# Patient Record
Sex: Female | Born: 1935 | Race: White | Hispanic: No | Smoking: Never smoker
Health system: Southern US, Community
[De-identification: ages and names within clinical notes are randomized; demographics above are authoritative.]

## PROBLEM LIST (undated history)

## (undated) DIAGNOSIS — I10 Essential (primary) hypertension: Secondary | ICD-10-CM

## (undated) DIAGNOSIS — IMO0001 Reserved for inherently not codable concepts without codable children: Secondary | ICD-10-CM

## (undated) DIAGNOSIS — I2510 Atherosclerotic heart disease of native coronary artery without angina pectoris: Secondary | ICD-10-CM

## (undated) DIAGNOSIS — IMO0002 Reserved for concepts with insufficient information to code with codable children: Secondary | ICD-10-CM

## (undated) DIAGNOSIS — K922 Gastrointestinal hemorrhage, unspecified: Secondary | ICD-10-CM

## (undated) DIAGNOSIS — M1990 Unspecified osteoarthritis, unspecified site: Secondary | ICD-10-CM

## (undated) DIAGNOSIS — E7800 Pure hypercholesterolemia, unspecified: Secondary | ICD-10-CM

## (undated) HISTORY — PX: ROTATOR CUFF REPAIR: SHX139

## (undated) HISTORY — PX: BACK SURGERY: SHX140

---

## 2006-08-23 ENCOUNTER — Inpatient Hospital Stay (HOSPITAL_COMMUNITY): Admission: EM | Admit: 2006-08-23 | Discharge: 2006-08-27 | Payer: Self-pay | Admitting: Emergency Medicine

## 2006-08-24 ENCOUNTER — Ambulatory Visit: Payer: Self-pay | Admitting: Vascular Surgery

## 2008-09-09 ENCOUNTER — Ambulatory Visit (HOSPITAL_COMMUNITY): Admission: RE | Admit: 2008-09-09 | Discharge: 2008-09-10 | Payer: Self-pay | Admitting: Obstetrics and Gynecology

## 2010-04-30 LAB — CBC
HCT: 40.4 % (ref 36.0–46.0)
Hemoglobin: 13.7 g/dL (ref 12.0–15.0)
MCHC: 33.8 g/dL (ref 30.0–36.0)
MCV: 89.8 fL (ref 78.0–100.0)
Platelets: 275 10*3/uL (ref 150–400)
RDW: 13.6 % (ref 11.5–15.5)
RDW: 13.8 % (ref 11.5–15.5)

## 2010-04-30 LAB — BASIC METABOLIC PANEL
BUN: 12 mg/dL (ref 6–23)
Calcium: 9.8 mg/dL (ref 8.4–10.5)
Creatinine, Ser: 0.83 mg/dL (ref 0.4–1.2)
GFR calc non Af Amer: >60 mL/min (ref >60)
Glucose, Bld: 107 mg/dL — ABNORMAL HIGH (ref 70–99)

## 2010-06-07 NOTE — Op Note (Signed)
Jamie Brown, Jamie Brown                  ACCOUNT NO.:  [id number]   MEDICAL RECORD NO.:  [id number]          PATIENT TYPE:  OIB   LOCATION:  9319                          FACILITY:  [facility]   PHYSICIAN:  Michelle L. Grewal, M.D.DATE OF BIRTH:  Dec 30, 1924   DATE OF PROCEDURE:  09/09/2008  DATE OF DISCHARGE:                               OPERATIVE REPORT   PREOPERATIVE DIAGNOSIS:  Symptomatic rectocele.   POSTOPERATIVE DIAGNOSIS:  Symptomatic rectocele.   PROCEDURE:  Posterior repair.   SURGEON:  Michelle L. Vincente Poli, MD   ANESTHESIA:  Spinal.   FINDINGS:  Grade 3 rectocele.   ESTIMATED BLOOD LOSS:  Minimal.   COMPLICATIONS:  None.   PROCEDURE:  The patient was taken to the operating room.  Her spinal was  placed by Dr. Malen Gauze without incident.  She was then prepped and draped  in usual sterile fashion, placed in the low lithotomy position.  A Foley  catheter was inserted.  Exam under anesthesia revealed a grade 3  rectocele.  She had excellent support of the bladder and her cervix was  deep in the vagina.  Allis clamps were placed at the perineum at 5 and 7  o'clock.  A V-shaped incision was made with a scalpel.  A midline  incision was made at the posterior wall of vagina with Metzenbaum  scissors.  The rectocele was reduced using sharp and blunt dissection.  We then fanned out the vaginal epithelium on both sides after the  rectocele was nicely reduced.  Then, interrupted using 0 Vicryl suture  was placed distally moving proximally and had excellent reduction of the  rectocele.  Careful attention was used to avoid the rectum.  After this  was done, a small figure-of-eight was placed in the perineum to  reapproximate the perineal muscles in the midline.  She had excellent  support of the perineal body after that.  The redundant vaginal  epithelium was trimmed.  The incision was closed with a running locked  stitch using 2-0 Vicryl starting distally and moving proximally and then  closing the skin with a subcuticular.  There was no bleeding noted at  the conclusion of the procedure.  There was no oozing at the suture  line.  Packing with Iodoform with Estrace cream was then carefully  placed in the vagina.  All sponge, lap, and instrument counts were  correct x2.  The patient went to recovery room in stable condition.      Michelle L. Vincente Poli, M.D.  Electronically Signed     MLG/MEDQ  D:  09/09/2008  T:  09/09/2008  Job:  [id number]

## 2010-06-07 NOTE — H&P (Signed)
Jamie Brown, HANNON NO.:  [id number]   MEDICAL RECORD NO.:  [id number]          PATIENT TYPE:  EMS   LOCATION:  URG                          FACILITY:  [facility]   PHYSICIAN:  Hind I Elsaid, MD      DATE OF BIRTH:  November 01, 1924   DATE OF ADMISSION:  08/23/2006  DATE OF DISCHARGE:                              HISTORY & PHYSICAL   CHIEF COMPLAINT:  Fever and nausea for 4 days.   HISTORY OF PRESENT ILLNESS:  A 77 year old female with a history of  hypertension and a history of dyslipidemia.  She was recently evaluated  4 weeks ago with nausea and vomiting and diarrhea; 4 weeks ago diagnosed  as viral gastritis.  Her symptoms were resolved, but the patient  continued to complain of generalized body weakness since then.  This  week the patient has chills, and the daughter noticed a fever of 102.7  at home.  Condition also associated with nausea and vomiting 3 to 4  times on Monday in addition to vomiting 3 to 4 times on Tuesday, but no  further vomiting was noticed by the daughter or her mom.  Patient denies  any abdominal pain, denies any diarrhea.  Last bowel movement today was  normal.  Patient seen 2 weeks ago and had some lab workup was done where  everything was, according to daughter, within normal.  Today patient was  evaluated at the urgent care because of fever and body weakness.  Also,  condition associated with loss of appetite, and, according to the  patient, she lost almost 8 pounds within the last 4 weeks.  The patient  denies any chest pain, denies shortness of breath, denies any headache,  numbness, or weakness of her extremities.  Denies any burning  micturition.  Has mild cough which is dry in nature.   PAST MEDICAL HISTORY:  Significant for:  1. Hypertension.  2. Cholesterolemia.  3. Coronary artery disease status post angioplasty 20 years ago.  Is      status post nuclear studies done 2 to 3 months ago which was      negative, according to the  daughter.  4. A history of back surgery.  5. Bilateral shoulder surgery.  6. C-section.  7. Some hearing defect on her right ear.   MEDICATIONS:  1. Procardia-XL 60 mg p.o. daily.  2. Aspirin 80 mg p.o. daily.  3. Simvastatin 4 mg p.o. at bedtime.  4. Ambien 10 mg at bedtime.  5. Torsemide 20 mg p.o. daily.  6. Darvocet N-100 as needed.  7. Potassium 10 mEq p.o. daily.  8. Calcium p.o. b.i.d.  9. Magnesium.  10.Zinc sulfate.  11.Glucosamine 500 mg p.o. b.i.d.  12.Vitamin B12.  13.Vitamin C.  14.Fish oil.  15.Multivitamins.   ALLERGIES:  CODEINE.   SOCIAL HISTORY:  no history of smoking, alcohol occasionally, widow,  husband died more than 10 years ago.   EXAMINATION DONE IN THE EMERGENCY ROOM:  VITAL SIGNS:  The patient has a  temperature of 99.4, blood pressure 106/58, pulse rate 111, regular rate  and rhythm, respiratory rate 12, and saturating 96% on room air.  GENERAL:  Patient lying on bed comfortable, in no apparent respiratory  distress or shortness of breath.  HEENT:  Normocephalic, atraumatic.  Pupils equal, round, reactive to  light and accommodation.  Extraocular muscle movement within normal.  Throat:  No evidence of tonsillar infections or pharyngitis.  NECK EXAMINATION:  Neck supple.  No thyromegaly, no JVD, and no  lymphadenopathy.  Carotid bruits absent.  HEART:  S1 and S2, no added sounds.  LUNG EXAMINATION:  Normal vesicular breathing, equal air entry.  ABDOMEN:  Soft, mild tenderness at the left suprapubic area, bowel  sounds positive.  No organomegaly.  EXTREMITIES:  Mild lower limb edema, right lower leg more than left, and  peripheral pulses intact.  BACK:  There is no point tenderness.  SKIN:  There is no evidence of rashes, no cyanosis.  CNS:  Alert, oriented x3 with no focal neurological finding.   Blood workup done in the ER:  UA show large leukocyte esterase, urine  nitrite positive, urine total protein 30, and urobilinogen 0.2, sodium,   hemoglobin is small, urine pH is 6.  White blood cells 21.5, hemoglobin  11.4, hematocrit 34.6, platelets 300 with some left shift.  That is all  blood workup done in the emergency room.   ASSESSMENT AND PLAN:  1. Above symptoms, denies body weakness with fever and also diet,      could be related to urinary tract infection.  We will start the      patient on intravenous antibiotic pending culture sensitivity, and      we will admit the patient.  2. Hypertension.  Continue home medication.  3. Dyslipidemia.  Continue her medication.  4. Lower extremity, right more than left lower extremity, edema.      According to the patient, this is chronic secondary to phlebitis.      We will get venous Doppler of the lower extremity for further      evaluation.  5. Deep vein thrombosis and gastrointestinal prophylaxis.      Hind Bosie Helper, MD  Electronically Signed     HIE/MEDQ  D:  08/23/2006  T:  08/24/2006  Job:  [id number]

## 2010-06-10 NOTE — Discharge Summary (Signed)
NAMEMARLANE, Jamie Brown                  ACCOUNT NO.:  [id number]   MEDICAL RECORD NO.:  [id number]          PATIENT TYPE:  INP   LOCATION:  5522                         FACILITY:  [facility]   PHYSICIAN:  Jamie Brown, M.D.      DATE OF BIRTH:  July 22, 1924   DATE OF ADMISSION:  08/23/2006  DATE OF DISCHARGE:  08/27/2006                               DISCHARGE SUMMARY   PRIMARY CARE PHYSICIAN:  The patient was unassigned to [place].  She has a  primary care physician out of town well and was to follow up with him.   DISCHARGE DIAGNOSES:  1. Generalized weakness probably secondary to urinary tract infection.  2. Pyelonephritis.  3. Dehydration with acute renal failure.  4. Hypertension.  5. Hyponatremia.  6. Dyslipidemia.  7. Leukocytosis.  8. Gastroesophageal reflux disease.   DISCHARGE MEDICATIONS:  1. Levaquin 500 mg daily for 5 days.  2. Aspirin 810 mg daily.  3. Colace 100 mg twice a day.  4. Demadex 20 mg daily.  5. Zocor 40 mg q.h.s.  6. Protonix 40 mg b.i.d.  7. Potassium chloride 10 mEq daily.  8. Procardia 60 mg daily.  9. Multivitamin 1 tablet daily.  10.Xalatan ophthalmic eyedrops applied q.h.s.   DISPOSITION:  The patient was discharged home to follow up with her  physician within 1 week.  Also she was asked to call back if there was  any fever, nausea, vomiting or recurrence of symptoms.   PROCEDURES PERFORMED THIS ADMISSION:  Chest x-ray on August 23, 2006  showed features of COPD, left basal atelectasis, dilated small-bowel  loops in the abdomen.  A follow-up chest x-ray on August 3 showed  emphysema with tiny bilateral pleural effusions, no edema or focal lung  consolidation.   CONSULTATIONS:  None.   BRIEF HISTORY AND PHYSICAL:  Please refer to dictated history and  physical by Dr. Darden Palmer. In short however, the patient was a 77-  year-old gentleman that presented to the ER with fever and nausea for 4  days.  She was also weak generally.  The patient had a  history of  hypertension, coronary artery disease, etc.  He was found however to  have evidence of UTI.   HOSPITAL COURSE:  1. Generalized weakness.  This was thought to be secondary to his      urinary tract infection and dehydration. With treatment the patient      improved and at the time of discharge his weakness has literally      resolved.  2. Urinary tract infection.  The patient was initially placed on Cipro      for the first 3 days.  Later with some cough although there was no      evidence of pneumonia, he was discharged on Levaquin to complete 7-      10 days of treatment.  3. Dehydration and acute renal failure.  The patient's BUN and      creatinine was 1.208.  His GFR was above 49.  He was dehydrated at      the time of admission.  After hydration, his BUN and creatinine      resolved and his BUN was 7, creatinine was 0.79 prior to discharge.      4.  Hypertension.  Brown pressure was initially low but the      patient's  Brown pressure rose to 154/81. He was started back on his home  medication and that seemed to have helped and control his Brown  pressure.  1. Hypokalemia.  This was transient and was completely resolved prior      to discharge.  2. Dyslipidemia.  The patient was on statin prior to admission and was      maintained on statin at the time of discharge.  3. Other medical problems essentially stable at the time of discharge.      Jamie Brown, M.D.  Electronically Signed     LG/MEDQ  D:  09/27/2006  T:  09/27/2006  Job:  [id number]

## 2010-11-07 LAB — CARDIAC PANEL(CRET KIN+CKTOT+MB+TROPI)
CK, MB: 1.8
Relative Index: 1.3
Relative Index: INVALID
Total CK: 71
Troponin I: 0.1 — ABNORMAL HIGH

## 2010-11-07 LAB — CBC
HCT: 29.2 — ABNORMAL LOW
HCT: 29.3 — ABNORMAL LOW
HCT: 29.8 — ABNORMAL LOW
HCT: 34.6 — ABNORMAL LOW
HCT: 34.6 — ABNORMAL LOW
Hemoglobin: 10.1 — ABNORMAL LOW
Hemoglobin: 10.2 — ABNORMAL LOW
Hemoglobin: 11.4 — ABNORMAL LOW
Hemoglobin: 11.7 — ABNORMAL LOW
MCHC: 34.1
MCHC: 34.4
MCHC: 34.5
MCHC: 33
MCHC: 33.9
MCV: 88.5
MCV: 88.8
MCV: 90.2
MCV: 91
Platelets: 263
Platelets: 289
Platelets: 346
Platelets: 291
Platelets: 300
RBC: 3.29 — ABNORMAL LOW
RBC: 3.37 — ABNORMAL LOW
RBC: 3.8 — ABNORMAL LOW
RBC: 3.83 — ABNORMAL LOW
RDW: 13.1
RDW: 13.6
RDW: 13.9
RDW: 12.9
RDW: 13
WBC: 16.9 — ABNORMAL HIGH
WBC: 17.8 — ABNORMAL HIGH
WBC: 18.3 — ABNORMAL HIGH
WBC: 21.5 — ABNORMAL HIGH

## 2010-11-07 LAB — PHOSPHORUS: Phosphorus: 3.2

## 2010-11-07 LAB — BASIC METABOLIC PANEL
BUN: 13
BUN: 7
CO2: 24
CO2: 26
Calcium: 8.3 — ABNORMAL LOW
Calcium: 8.6
Creatinine, Ser: 0.79
GFR calc non Af Amer: 49 — ABNORMAL LOW
GFR calc non Af Amer: 57 — ABNORMAL LOW
GFR calc non Af Amer: >60
Glucose, Bld: 115 — ABNORMAL HIGH
Glucose, Bld: 140 — ABNORMAL HIGH
Potassium: 3.2 — ABNORMAL LOW
Potassium: 3.9
Sodium: 131 — ABNORMAL LOW
Sodium: 141

## 2010-11-07 LAB — POCT URINALYSIS DIP (DEVICE)
Bilirubin Urine: NEGATIVE
Glucose, UA: NEGATIVE
Ketones, ur: NEGATIVE
Nitrite: POSITIVE — AB
Operator id: 247071
Protein, ur: 30 — AB
Specific Gravity, Urine: <=1.005
Urobilinogen, UA: 0.2
pH: 6

## 2010-11-07 LAB — URINALYSIS, MICROSCOPIC ONLY
Bilirubin Urine: NEGATIVE
Nitrite: NEGATIVE
Specific Gravity, Urine: 1.01
pH: 6

## 2010-11-07 LAB — TSH: TSH: 0.528

## 2010-11-07 LAB — DIFFERENTIAL
Basophils Absolute: 0
Basophils Relative: 0
Eosinophils Absolute: 0
Eosinophils Relative: 0
Lymphocytes Relative: 4 — ABNORMAL LOW
Lymphs Abs: 0.9
Monocytes Absolute: 1.2 — ABNORMAL HIGH
Monocytes Relative: 6
Neutro Abs: 19.4 — ABNORMAL HIGH
Neutrophils Relative %: 90 — ABNORMAL HIGH

## 2010-11-07 LAB — CULTURE, BLOOD (ROUTINE X 2): Culture: NO GROWTH

## 2010-11-07 LAB — COMPREHENSIVE METABOLIC PANEL
ALT: 39 — ABNORMAL HIGH
AST: 34
Alkaline Phosphatase: 104
Calcium: 8.6
GFR calc Af Amer: 49 — ABNORMAL LOW
Potassium: 2.8 — ABNORMAL LOW
Sodium: 128 — ABNORMAL LOW
Total Protein: 6.8

## 2010-11-07 LAB — MAGNESIUM
Magnesium: 2.2
Magnesium: 2.1

## 2010-11-07 LAB — CK TOTAL AND CKMB (NOT AT ARMC)
CK, MB: 1.5
Relative Index: INVALID
Total CK: 71

## 2010-11-07 LAB — URINE CULTURE

## 2010-11-07 LAB — TROPONIN I

## 2014-10-14 ENCOUNTER — Encounter (HOSPITAL_BASED_OUTPATIENT_CLINIC_OR_DEPARTMENT_OTHER): Payer: Self-pay | Admitting: Emergency Medicine

## 2014-10-14 ENCOUNTER — Emergency Department (HOSPITAL_BASED_OUTPATIENT_CLINIC_OR_DEPARTMENT_OTHER): Payer: Medicare Other

## 2014-10-14 ENCOUNTER — Emergency Department (HOSPITAL_BASED_OUTPATIENT_CLINIC_OR_DEPARTMENT_OTHER)
Admission: EM | Admit: 2014-10-14 | Discharge: 2014-10-14 | Disposition: A | Discharge status: Home / Self Care | Payer: Medicare Other | Attending: Emergency Medicine | Admitting: Emergency Medicine

## 2014-10-14 DIAGNOSIS — Y9289 Other specified places as the place of occurrence of the external cause: Secondary | ICD-10-CM | POA: Diagnosis not present

## 2014-10-14 DIAGNOSIS — Y998 Other external cause status: Secondary | ICD-10-CM | POA: Diagnosis not present

## 2014-10-14 DIAGNOSIS — S0512XA Contusion of eyeball and orbital tissues, left eye, initial encounter: Secondary | ICD-10-CM | POA: Diagnosis not present

## 2014-10-14 DIAGNOSIS — Y9389 Activity, other specified: Secondary | ICD-10-CM | POA: Insufficient documentation

## 2014-10-14 DIAGNOSIS — Z043 Encounter for examination and observation following other accident: Secondary | ICD-10-CM | POA: Diagnosis present

## 2014-10-14 DIAGNOSIS — W102XXA Fall (on)(from) incline, initial encounter: Secondary | ICD-10-CM | POA: Insufficient documentation

## 2014-10-14 DIAGNOSIS — Z7982 Long term (current) use of aspirin: Secondary | ICD-10-CM | POA: Diagnosis not present

## 2014-10-14 DIAGNOSIS — Z79899 Other long term (current) drug therapy: Secondary | ICD-10-CM | POA: Insufficient documentation

## 2014-10-14 DIAGNOSIS — I10 Essential (primary) hypertension: Secondary | ICD-10-CM | POA: Insufficient documentation

## 2014-10-14 DIAGNOSIS — E780 Pure hypercholesterolemia: Secondary | ICD-10-CM | POA: Insufficient documentation

## 2014-10-14 HISTORY — DX: Pure hypercholesterolemia, unspecified: E78.00

## 2014-10-14 HISTORY — DX: Essential (primary) hypertension: I10

## 2014-10-14 NOTE — ED Provider Notes (Signed)
CSN: [id number]     Arrival date & time 10/14/14  0216 History   First MD Initiated Contact with Patient 10/14/14 [phone]     Chief Complaint  Patient presents with  . Fall     (Consider location/radiation/quality/duration/timing/severity/associated sxs/prior Treatment) Patient is a 81 y.o. female presenting with fall. The history is provided by the patient.  Fall This is a new problem. The current episode started less than 1 hour ago. The problem occurs constantly. The problem has not changed since onset.Pertinent negatives include no chest pain, no abdominal pain, no headaches and no shortness of breath. Nothing aggravates the symptoms. Nothing relieves the symptoms. She has tried nothing for the symptoms. The treatment provided no relief.    Past Medical History  Diagnosis Date  . Hypertension   . High cholesterol    Past Surgical History  Procedure Laterality Date  . Cesarean section    . Back surgery     History reviewed. No pertinent family history. Social History  Substance Use Topics  . Smoking status: Never Smoker   . Smokeless tobacco: None  . Alcohol Use: No   OB History    No data available     Review of Systems  Respiratory: Negative for shortness of breath.   Cardiovascular: Negative for chest pain.  Gastrointestinal: Negative for abdominal pain.  Neurological: Negative for dizziness, tremors, seizures, syncope, facial asymmetry, speech difficulty, weakness, light-headedness, numbness and headaches.  All other systems reviewed and are negative.     Allergies  Codeine and Isoptin  Home Medications   Prior to Admission medications   Medication Sig Start Date End Date Taking? Authorizing Provider  aspirin 81 MG tablet Take 81 mg by mouth daily.   Yes Historical Provider, MD  Fish Oil OIL by Does not apply route.   Yes Historical Provider, MD  Multiple Vitamin (MULTIVITAMIN) capsule Take 1 capsule by mouth daily.   Yes Historical Provider, MD  POTASSIUM  CHLORIDE ER PO Take by mouth.   Yes Historical Provider, MD  simvastatin (ZOCOR) 40 MG tablet Take 40 mg by mouth daily.   Yes Historical Provider, MD  Tetrahydrozoline HCl (EYE DROPS OP) Apply to eye.   Yes Historical Provider, MD  TORSEMIDE PO Take by mouth.   Yes Historical Provider, MD  zolpidem (AMBIEN) 5 MG tablet Take 5 mg by mouth at bedtime as needed for sleep.   Yes Historical Provider, MD   BP 161/63 mmHg  Pulse 92  Temp(Src) 98 F (36.7 C) (Oral)  Resp 18  Ht 5' 1.5" (1.562 m)  Wt 135 lb (61.236 kg)  BMI 25.10 kg/m2  SpO2 95% Physical Exam  Constitutional: She is oriented to person, place, and time. She appears well-developed and well-nourished. No distress.  HENT:  Head: Head is without raccoon's eyes and without Battle's sign.  Right Ear: No hemotympanum.  Left Ear: No hemotympanum.  Mouth/Throat: Oropharynx is clear and moist.  Healing ecchymosis over left eye  Eyes: Conjunctivae and EOM are normal. Pupils are equal, round, and reactive to light.  Neck: Normal range of motion. Neck supple.  Cardiovascular: Normal rate, regular rhythm and intact distal pulses.   Pulmonary/Chest: Effort normal and breath sounds normal. No respiratory distress. She has no wheezes. She has no rales.  Abdominal: Soft. Bowel sounds are normal. There is no tenderness. There is no rebound and no guarding.  Musculoskeletal: Normal range of motion. She exhibits no edema or tenderness.  Neurological: She is alert and oriented to person,  place, and time. She has normal reflexes.  Gait intact  Skin: Skin is warm and dry.  Psychiatric: She has a normal mood and affect.    ED Course  Procedures (including critical care time) Labs Review Labs Reviewed - No data to display  Imaging Review Ct Head Wo Contrast  10/14/2014   CLINICAL DATA:  Fall with forehead injury, headache, neck pain.  EXAM: CT HEAD WITHOUT CONTRAST  CT CERVICAL SPINE WITHOUT CONTRAST  TECHNIQUE: Multidetector CT imaging of  the head and cervical spine was performed following the standard protocol without intravenous contrast. Multiplanar CT image reconstructions of the cervical spine were also generated.  COMPARISON:  10/13/2014 head CT  FINDINGS: CT HEAD FINDINGS  Skull and Sinuses:  No acute fracture or notable swelling/hematoma.  Secretions retained within the left sphenoid sinus.  Orbits: No acute abnormality.  Brain: No evidence of acute infarction, hemorrhage, hydrocephalus, or mass lesion/mass effect.  Moderate atrophy and chronic small vessel ischemia in the cerebral white matter.  CT CERVICAL SPINE FINDINGS  No evidence of acute fracture. No suspected traumatic malalignment. Mild anterolisthesis at C2-3, C3-4, and C4-5 is associated with severe facet arthropathy, the best explanation. No gross canal hematoma or prevertebral swelling.  Diffuse idiopathic skeletal hyperostosis with bulky osteophytes from C4-5 to the upper thoracic spine at least, distorting the hypopharynx. Ligamentous ossification and thickening around the chronically eroded dens, potential calcium pyrophosphate deposition disease.  IMPRESSION: 1. No acute intracranial or cervical spine findings. 2. Degenerative and senescent changes are noted above.   Electronically Signed   By: Monte Fantasia M.D.   On: 10/14/2014 04:17   Ct Cervical Spine Wo Contrast  10/14/2014   CLINICAL DATA:  Fall with forehead injury, headache, neck pain.  EXAM: CT HEAD WITHOUT CONTRAST  CT CERVICAL SPINE WITHOUT CONTRAST  TECHNIQUE: Multidetector CT imaging of the head and cervical spine was performed following the standard protocol without intravenous contrast. Multiplanar CT image reconstructions of the cervical spine were also generated.  COMPARISON:  10/13/2014 head CT  FINDINGS: CT HEAD FINDINGS  Skull and Sinuses:  No acute fracture or notable swelling/hematoma.  Secretions retained within the left sphenoid sinus.  Orbits: No acute abnormality.  Brain: No evidence of acute  infarction, hemorrhage, hydrocephalus, or mass lesion/mass effect.  Moderate atrophy and chronic small vessel ischemia in the cerebral white matter.  CT CERVICAL SPINE FINDINGS  No evidence of acute fracture. No suspected traumatic malalignment. Mild anterolisthesis at C2-3, C3-4, and C4-5 is associated with severe facet arthropathy, the best explanation. No gross canal hematoma or prevertebral swelling.  Diffuse idiopathic skeletal hyperostosis with bulky osteophytes from C4-5 to the upper thoracic spine at least, distorting the hypopharynx. Ligamentous ossification and thickening around the chronically eroded dens, potential calcium pyrophosphate deposition disease.  IMPRESSION: 1. No acute intracranial or cervical spine findings. 2. Degenerative and senescent changes are noted above.   Electronically Signed   By: Monte Fantasia M.D.   On: 10/14/2014 04:17   I have personally reviewed and evaluated these images and lab results as part of my medical decision-making.   EKG Interpretation None      MDM   Final diagnoses:  None    Labs EKG and urine reviewed from [facility] yesterday and normal.  Will recommend higher level of care at nursing home.     Veatrice Kells, MD 10/14/14 [id number]

## 2014-10-14 NOTE — ED Notes (Signed)
Pt went to get out of bed and tripped over cover.

## 2014-10-14 NOTE — ED Notes (Signed)
Pt verbalizes understanding of d/c instructions and denies any further needs at this time. 

## 2014-10-14 NOTE — Discharge Instructions (Signed)
Fall Prevention and Home Safety Falls cause injuries and can affect all age groups. It is possible to prevent falls.  HOW TO PREVENT FALLS  Wear shoes with rubber soles that do not have an opening for your toes.  Keep the inside and outside of your house well lit.  Use night lights throughout your home.  Remove clutter from floors.  Clean up floor spills.  Remove throw rugs or fasten them to the floor with carpet tape.  Do not place electrical cords across pathways.  Put grab bars by your tub, shower, and toilet. Do not use towel bars as grab bars.  Put handrails on both sides of the stairway. Fix loose handrails.  Do not climb on stools or stepladders, if possible.  Do not wax your floors.  Repair uneven or unsafe sidewalks, walkways, or stairs.  Keep items you use a lot within reach.  Be aware of pets.  Keep emergency numbers next to the telephone.  Put smoke detectors in your home and near bedrooms. Ask your doctor what other things you can do to prevent falls. Document Released: 11/05/2008 Document Revised: 07/11/2011 Document Reviewed: 04/11/2011 [facility] Patient Information 2015 [place], [place]. This information is not intended to replace advice given to you by your health care provider. Make sure you discuss any questions you have with your health care provider.   Recommend higher level of care at nursing home and neuropsychiatric evaluation for dementia.

## 2014-10-22 ENCOUNTER — Telehealth: Payer: Self-pay | Admitting: *Deleted

## 2014-10-22 NOTE — Telephone Encounter (Signed)
Dr. Thana Ates office will email photos of patient directly to Dr. Valere Dross.

## 2014-10-28 NOTE — Progress Notes (Signed)
Histology and Location of Primary Skin Cancer: Invasive [place],Right Neck , Jamie Brown presented with the following signs/symptoms with report of intermittent bleeding, then tumor was discovered.  Past/Anticipated interventions by patient's surgeon/dermatologist for current problematic lesion, if any: cleared tumor with 3 stages of Mohs surgery- decfect closed  Past skin cancers, if any:  1) Location/Histology/Intervention:Forehead  2) Location/Histology/Intervention: Rght temple  3) Location/Histology/Intervention:   History of Blistering sunburns, if any: yes in the past.  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 No  Pacemaker/ICD? No  Possible current pregnancy? No  Is the patient on methotrexate? No  Current Complaints / other details:

## 2014-10-29 ENCOUNTER — Ambulatory Visit
Admission: RE | Admit: 2014-10-29 | Discharge: 2014-10-29 | Disposition: A | Discharge status: Home / Self Care | Payer: Medicare Other | Source: Ambulatory Visit | Attending: Radiation Oncology | Admitting: Radiation Oncology

## 2014-10-29 ENCOUNTER — Encounter: Payer: Self-pay | Admitting: Radiation Oncology

## 2014-10-29 VITALS — BP 139/71 | HR 95 | Temp 97.5°F | Ht 61.5 in | Wt 132.4 lb

## 2014-10-29 DIAGNOSIS — Z510 Encounter for antineoplastic radiation therapy: Secondary | ICD-10-CM | POA: Insufficient documentation

## 2014-10-29 DIAGNOSIS — C4490 Unspecified malignant neoplasm of skin, unspecified: Secondary | ICD-10-CM

## 2014-10-29 DIAGNOSIS — C4442 Squamous cell carcinoma of skin of scalp and neck: Secondary | ICD-10-CM

## 2014-10-29 DIAGNOSIS — C760 Malignant neoplasm of head, face and neck: Secondary | ICD-10-CM | POA: Diagnosis present

## 2014-10-29 DIAGNOSIS — E7800 Pure hypercholesterolemia, unspecified: Secondary | ICD-10-CM | POA: Insufficient documentation

## 2014-10-29 DIAGNOSIS — I10 Essential (primary) hypertension: Secondary | ICD-10-CM | POA: Insufficient documentation

## 2014-10-29 NOTE — Progress Notes (Signed)
[facility] NEW PATIENT EVALUATION  Name: Jamie Brown MRN: [id number]  Date:   10/29/2014           DOB: 1924/12/19  Status: outpatient   CC: Dr. Javier Glazier,  Janann August, MD , Dr. 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   HISTORY OF PRESENT ILLNESS:  Jamie Brown is a 81 y.o. female who is seen today through the courtesy of Dr. Link Snuffer for consideration of postoperative radiotherapy in the management of her squamous cell carcinoma of the skin arising from the upper right neck.  Her daughter tells me that she apparently developed a keratotic lesion along the upper right neck  2-3 months ago.  It would occasionally bleed.  She saw Dr. Janifer Adie of dermatology in [facility] and he performed a biopsy on 09/07/2014 which was diagnostic for "superficial squamous cell carcinoma extending to the base of the specimen".  She was referred to Dr. Link Snuffer who performed a 3 stage Mohs procedure with primary closure.  The tumor infiltrated into the parotid gland and muscle with large nerve perineural disease (0.16 mm).  She is doing well postoperatively although she did have a recent syncopal episode with a visit to the emergency department where they obtained a brain scan and CT scan of the cervical spine/neck.  Her noncontrast scan of the cervical spine/neck was without evidence for parotid or right cervical lymphadenopathy.  This was reviewed with diagnostic radiology earlier today.  She is seen today with her daughter.  Her daughter tells me that she has had a recent weight loss, and she may be depressed.  She may be started on an anti-depressant.  PREVIOUS RADIATION THERAPY: No   PAST MEDICAL HISTORY:  has a past medical history of Hypertension and High cholesterol.     PAST SURGICAL HISTORY:  Past Surgical History  Procedure Laterality  Date  . Cesarean section    . Back surgery       FAMILY HISTORY: family history is not on file.   SOCIAL HISTORY:  reports that she has never smoked. She does not have any smokeless tobacco history on file. She reports that she does not drink alcohol or use illicit drugs.  Widowed for the past 19 years, 3 children.  She taught high school math.   ALLERGIES: Codeine and Isoptin   MEDICATIONS:  Current Outpatient Prescriptions  Medication Sig Dispense Refill  . aspirin 81 MG tablet Take 81 mg by mouth daily.    Marland Kitchen CRANBERRY EXTRACT PO Take 25,000 mg by mouth.    . Fish Oil OIL 1 capsule by Does not apply route 1 day or 1 dose.     Marland Kitchen GLUCOSAMINE CHONDROITIN COMPLX PO Take 1,000 mg by mouth 1 day or 1 dose.    Marland Kitchen NIFEdipine (PROCARDIA XL/ADALAT-CC) 60 MG 24 hr tablet Take 60 mg by mouth daily.    . simvastatin (ZOCOR) 40 MG tablet Take 40 mg by mouth daily.    . Tetrahydrozoline HCl (EYE DROPS OP) Apply to eye.    . TORSEMIDE PO Take 10 mg by mouth 1 day or 1 dose.     . traMADol (ULTRAM) 50 MG tablet Take 50 mg by mouth 2 (two) times daily as needed.    Marland Kitchen UNABLE TO FIND 1 drop at bedtime. Med Name: Zioptin 1 drop each eye at bedtime    .  zolpidem (AMBIEN) 5 MG tablet Take 15 mg by mouth at bedtime as needed for sleep.     Marland Kitchen POTASSIUM CHLORIDE ER PO Take by mouth.     No current facility-administered medications for this encounter.     REVIEW OF SYSTEMS:  Pertinent items are noted in HPI.    PHYSICAL EXAM:  height is 5' 1.5" (1.562 m) and weight is 132 lb 6.4 oz (60.056 kg). Her temperature is 97.5 F (36.4 C). Her blood pressure is 139/71 and her pulse is 95.   Alert and oriented 81 year old white female appearing her stated age.  Nodes: There is no palpable lymphadenopathy in the left or right neck.  He is no palpable periaricular or parotid adenopathy.  Her upper right neck wound is healing well with some soft tissue induration as expected.  There is a small area of granulation  tissue along the inferior aspect of her wound.  On inspection oral cavity there is no posterior mandibular dentition.  No lesions are appreciated.  Neurologic examination is grossly nonfocal.   LABORATORY DATA:  Lab Results  Component Value Date   WBC 10.1 09/10/2008   HGB 11.1 DELTA CHECK NOTED* 09/10/2008   HCT 32.7* 09/10/2008   MCV 90.8 09/10/2008   PLT 275 09/10/2008   Lab Results  Component Value Date   NA 137 09/08/2008   K 3.6 09/08/2008   CL 99 09/08/2008   CO2 28 09/08/2008   Lab Results  Component Value Date   ALT 39* 08/23/2006   AST 34 08/23/2006   ALKPHOS 104 08/23/2006   BILITOT 0.5 08/23/2006      IMPRESSION: Squamous cell carcinoma of the skin with perineural invasion involving the upper right neck.  In view of her major nerve perineural invasion, I do recommend postoperative radiation therapy.  I reviewed her recent CT scan, and there is no evidence for right parotid or cervical adenopathy.  I do not feel that a MRI scan is indicated to further assess her cervical or cranial nerves in the absence of neurologic symptoms.  From a technical standpoint, she will be treated with electrons to cover the parotid bed and mid upper neck.  Because of the adjacent mandible she will need a fractionated course over approximately 6 weeks (NCCN guidelines).  We discussed the potential acute and late toxicities of radiation therapy.  Consent is signed today.  We will start her radiation therapy after she visits Dr. Link Snuffer on October 25.  She will visit [place] for treatment planning the week before.  This will be scheduled to her daughter.   PLAN: As above.  I spent 45 minutes face to face with the patient and more than 50% of that time was spent in counseling and/or coordination of care.

## 2014-11-04 ENCOUNTER — Emergency Department (HOSPITAL_BASED_OUTPATIENT_CLINIC_OR_DEPARTMENT_OTHER)
Admission: EM | Admit: 2014-11-04 | Discharge: 2014-11-04 | Disposition: A | Discharge status: Home / Self Care | Payer: Medicare Other | Attending: Emergency Medicine | Admitting: Emergency Medicine

## 2014-11-04 ENCOUNTER — Encounter (HOSPITAL_BASED_OUTPATIENT_CLINIC_OR_DEPARTMENT_OTHER): Payer: Self-pay

## 2014-11-04 ENCOUNTER — Emergency Department (HOSPITAL_BASED_OUTPATIENT_CLINIC_OR_DEPARTMENT_OTHER): Payer: Medicare Other

## 2014-11-04 DIAGNOSIS — I10 Essential (primary) hypertension: Secondary | ICD-10-CM | POA: Insufficient documentation

## 2014-11-04 DIAGNOSIS — W01198A Fall on same level from slipping, tripping and stumbling with subsequent striking against other object, initial encounter: Secondary | ICD-10-CM | POA: Diagnosis not present

## 2014-11-04 DIAGNOSIS — Y9389 Activity, other specified: Secondary | ICD-10-CM | POA: Diagnosis not present

## 2014-11-04 DIAGNOSIS — Z85828 Personal history of other malignant neoplasm of skin: Secondary | ICD-10-CM | POA: Diagnosis not present

## 2014-11-04 DIAGNOSIS — Y92009 Unspecified place in unspecified non-institutional (private) residence as the place of occurrence of the external cause: Secondary | ICD-10-CM | POA: Insufficient documentation

## 2014-11-04 DIAGNOSIS — Z7982 Long term (current) use of aspirin: Secondary | ICD-10-CM | POA: Diagnosis not present

## 2014-11-04 DIAGNOSIS — Z79899 Other long term (current) drug therapy: Secondary | ICD-10-CM | POA: Insufficient documentation

## 2014-11-04 DIAGNOSIS — S0101XA Laceration without foreign body of scalp, initial encounter: Secondary | ICD-10-CM | POA: Insufficient documentation

## 2014-11-04 DIAGNOSIS — S0990XA Unspecified injury of head, initial encounter: Secondary | ICD-10-CM | POA: Diagnosis present

## 2014-11-04 DIAGNOSIS — S0012XA Contusion of left eyelid and periocular area, initial encounter: Secondary | ICD-10-CM

## 2014-11-04 DIAGNOSIS — T07XXXA Unspecified multiple injuries, initial encounter: Secondary | ICD-10-CM

## 2014-11-04 DIAGNOSIS — E7800 Pure hypercholesterolemia, unspecified: Secondary | ICD-10-CM | POA: Insufficient documentation

## 2014-11-04 DIAGNOSIS — W19XXXA Unspecified fall, initial encounter: Secondary | ICD-10-CM

## 2014-11-04 DIAGNOSIS — Y998 Other external cause status: Secondary | ICD-10-CM | POA: Insufficient documentation

## 2014-11-04 HISTORY — DX: Reserved for concepts with insufficient information to code with codable children: IMO0002

## 2014-11-04 MED ORDER — LIDOCAINE-EPINEPHRINE 2 %-1:100000 IJ SOLN
20.0000 mL | Freq: Once | INTRAMUSCULAR | Status: AC
  Administered 2014-11-04: 20 mL
  Filled 2014-11-04: qty 1

## 2014-11-04 NOTE — ED Provider Notes (Signed)
CSN: [id number]     Arrival date & time 11/04/14  0507 History   First MD Initiated Contact with Patient 11/04/14 [phone]     Chief Complaint  Patient presents with  . Fall     (Consider location/radiation/quality/duration/timing/severity/associated sxs/prior Treatment) HPI  This is an 81 year old female who lives in independent living. She is ambulatory with a walker. She has a history of recent falls. She fell about a half an hour prior to arrival when her walker caught on something. She struck her left for head and left parietal scalp but does not know what she struck them on. She remembers the fall, there was no loss of consciousness. She denies nausea or vomiting. She denies neck or back pain. She denies extremity pain. She has been ambulatory with her walker since the fall. EMS notes a skin tear to the right elbow, left eyebrow hematoma and left for head abrasions as well as to left parietal scalp lacerations. Pain is mild. Wounds are hemostatic. She is complaining of double distant vision but not close vision.  The abrasions were bandaged prior to arrival. She is not on anticoagulation apart from low-dose aspirin.   Past Medical History  Diagnosis Date  . Hypertension   . High cholesterol   . Squamous cell carcinoma [facility])    Past Surgical History  Procedure Laterality Date  . Cesarean section    . Back surgery     No family history on file. Social History  Substance Use Topics  . Smoking status: Never Smoker   . Smokeless tobacco: None  . Alcohol Use: No   OB History    No data available     Review of Systems  All other systems reviewed and are negative.   Allergies  Codeine; Isoptin; and Remeron  Home Medications   Prior to Admission medications   Medication Sig Start Date End Date Taking? Authorizing Provider  Sertraline HCl (ZOLOFT PO) Take 10 mg by mouth.   Yes Historical Provider, MD  aspirin 81 MG tablet Take 81 mg by mouth daily.    Historical Provider,  MD  CRANBERRY EXTRACT PO Take 25,000 mg by mouth.    Historical Provider, MD  Fish Oil OIL 1 capsule by Does not apply route 1 day or 1 dose.     Historical Provider, MD  GLUCOSAMINE CHONDROITIN COMPLX PO Take 1,000 mg by mouth 1 day or 1 dose.    Historical Provider, MD  NIFEdipine (PROCARDIA XL/ADALAT-CC) 60 MG 24 hr tablet Take 60 mg by mouth daily.    Historical Provider, MD  POTASSIUM CHLORIDE ER PO Take by mouth.    Historical Provider, MD  simvastatin (ZOCOR) 40 MG tablet Take 40 mg by mouth daily.    Historical Provider, MD  Tetrahydrozoline HCl (EYE DROPS OP) Apply to eye.    Historical Provider, MD  TORSEMIDE PO Take 10 mg by mouth 1 day or 1 dose.     Historical Provider, MD  traMADol (ULTRAM) 50 MG tablet Take 50 mg by mouth 2 (two) times daily as needed.    Historical Provider, MD  UNABLE TO FIND 1 drop at bedtime. Med Name: Zioptin 1 drop each eye at bedtime    Historical Provider, MD  zolpidem (AMBIEN) 5 MG tablet Take 15 mg by mouth at bedtime as needed for sleep.     Historical Provider, MD   BP 128/51 mmHg  Pulse 102  Temp(Src) 98.7 F (37.1 C) (Oral)  Resp 18  SpO2 94%  Physical Exam  General: Well-developed, well-nourished female in no acute distress; appearance consistent with age of record HENT: normocephalic; hematoma of left eyebrow without bony deformity; superficial abrasions of left cheek and left forehead; no hemotympanum; 2 lacerations of left parietal scalp without significant hematoma Eyes: pupils equal, round and reactive to light; extraocular muscles intact Neck: supple; nontender Heart: regular rate and rhythm; distant sounds Lungs: clear to auscultation bilaterally Abdomen: soft; nondistended; nontender; bowel sounds present Extremities: Arthritic changes; nontender; pulses normal; trace edema of lower legs Neurologic: Awake, alert and oriented; motor function intact in all extremities and symmetric; no facial droop Skin: Warm and dry; skin tear right  elbow Psychiatric: Normal mood and affect    ED Course  Procedures (including critical care time)  LACERATION REPAIR x 2 Performed by: Demontae Antunes L Authorized by: Wynetta Fines Consent: Verbal consent obtained. Risks and benefits: risks, benefits and alternatives were discussed Consent given by: patient Patient identity confirmed: provided demographic data Prepped and Draped in normal sterile fashion Wound explored  Laceration Location: Left parietal scalp  Laceration Length: 4 cm + 2 cm  No Foreign Bodies seen or palpated  Anesthesia: local infiltration  Local anesthetic: lidocaine 2% with epinephrine  Anesthetic total: 6 ml  Irrigation method: syringe Amount of cleaning: standard  Skin closure: staple  Number of staples: 5 + 2  Patient tolerance: Patient tolerated the procedure well with no immediate complications.   MDM   Nursing notes and vitals signs, including pulse oximetry, reviewed.  Summary of this visit's results, reviewed by myself:  Imaging Studies: Ct Head Wo Contrast  11/04/2014  CLINICAL DATA:  [place] this morning, RIGHT frontal scalp laceration, LEFT periorbital swelling. History of falls, hypertension and hypercholesterolemia. EXAM: CT HEAD WITHOUT CONTRAST TECHNIQUE: Contiguous axial images were obtained from the base of the skull through the vertex without intravenous contrast. COMPARISON:  CT head October 14, 2014 FINDINGS: The ventricles and sulci are normal for age. No intraparenchymal hemorrhage, mass effect nor midline shift. Patchy to confluent supratentorial white matter hypodensities are within normal range for patient's age and though non-specific suggest sequelae of chronic small vessel ischemic disease. No acute large vascular territory infarcts. No abnormal extra-axial fluid collections. Basal cisterns are patent. Severe calcific atherosclerosis of the carotid siphons and included vertebral arteries. No skull fracture. Osteopenia.  Small LEFT frontal/ supraorbital soft tissue swelling without subcutaneous gas or radiopaque foreign bodies. LEFT frontal vertex skin staples. The included ocular globes and orbital contents are non-suspicious. Status post bilateral ocular lens implants. Mild LEFT sphenoid sinusitis. The mastoid air cells are well aerated. IMPRESSION: No acute intracranial process. Small LEFT periorbital/ frontal scalp hematoma. No skull fracture. No postseptal hematoma. Chronic changes including moderate to severe chronic small vessel ischemic disease. Electronically Signed   By: Elon Alas M.D.   On: 11/04/2014 06:17     5:51 AM The patient's son-in-law has expressed concern that the patient is taking 10 milligrams of Ambien in the evening to go to sleep and then takes another half a tablet when she wakes up in the middle of the night. They were advised to discuss her Ambien use with her primary care physician.   Shanon Rosser, MD 11/04/14 [phone]

## 2014-11-04 NOTE — ED Notes (Signed)
Family at bedside. 

## 2014-11-04 NOTE — ED Notes (Signed)
MD at bedside. 

## 2014-11-04 NOTE — ED Notes (Signed)
Pt states tripped by her walker throwing her forward; pt has abrasions to face, head, and rt elbow; denies loc

## 2014-11-04 NOTE — ED Notes (Signed)
Pt has a quarter sized skin tear with bruising below right elbow.  She has a hematoma around left eye and an approximately 4 inch jagged laceration, 0.25cm deep, to top left of scalp.

## 2014-11-04 NOTE — ED Notes (Signed)
Pt and daughter verbalize understanding of dc instructions and deny any further needs at this time 

## 2014-11-06 ENCOUNTER — Encounter: Payer: Self-pay | Admitting: *Deleted

## 2014-11-06 NOTE — Progress Notes (Signed)
[place] Psychosocial Distress Screening Clinical Social Work  Clinical Social Work was referred by distress screening protocol.  The patient scored a 8 on the Psychosocial Distress Thermometer which indicates severe distress. Clinical Social Worker phoned pt to assess for distress and other psychosocial needs. CSW left vm for pt to return CSW call.   ONCBCN DISTRESS SCREENING 10/29/2014  Screening Type Initial Screening  Distress experienced in past week (1-10) 8  Emotional problem type Depression;Nervousness/Anxiety  Physical Problem type Sleep/insomnia;Loss of appetitie;Constipation/diarrhea  Physician notified of physical symptoms Yes  Referral to clinical social work Yes  Referral to dietition Yes     Clinical Social Worker follow up needed: Yes.    If yes, follow up plan: CSW awaits return call or can meet with pt at future appointments.  Loren Racer, [place] Worker [place]  [place] Phone: [phone] Fax: [phone]

## 2014-11-09 ENCOUNTER — Ambulatory Visit: Payer: Medicare Other | Admitting: Radiation Oncology

## 2014-11-12 NOTE — Addendum Note (Signed)
Encounter addended by: Benn Moulder, RN on: 11/12/2014 11:33 AM<BR>     Documentation filed: Charges VN

## 2014-11-16 ENCOUNTER — Ambulatory Visit
Admission: RE | Admit: 2014-11-16 | Discharge: 2014-11-16 | Disposition: A | Discharge status: Home / Self Care | Payer: Medicare Other | Source: Ambulatory Visit | Attending: Radiation Oncology | Admitting: Radiation Oncology

## 2014-11-16 DIAGNOSIS — Z510 Encounter for antineoplastic radiation therapy: Secondary | ICD-10-CM | POA: Diagnosis not present

## 2014-11-16 DIAGNOSIS — C4442 Squamous cell carcinoma of skin of scalp and neck: Secondary | ICD-10-CM

## 2014-11-16 NOTE — Progress Notes (Signed)
Complex simulation/treatment planning note: The patient was taken to the CT simulator.  A head cast was constructed.  I outlined her intended target volume on her skin and placed a radiopaque wire.  I also marked her surgical scar.  1.0 cm custom bolus was placed on the skin and she was then scanned.  I chose an isocenter.  I reviewed her case with dosimetry, and I want to expand the wire borders by 1.0 cm to create the block for electron beam.  I am prescribing 6000 cGy in 30 sessions utilizing 12 MEV electrons.

## 2014-11-17 ENCOUNTER — Encounter: Payer: Self-pay | Admitting: Radiation Oncology

## 2014-11-17 DIAGNOSIS — Z510 Encounter for antineoplastic radiation therapy: Secondary | ICD-10-CM | POA: Diagnosis not present

## 2014-11-17 NOTE — Progress Notes (Signed)
Electron beam simulation/treatment planning note: The patient completed her electron beam planning for treatment to her right neck.  She is set up en face.  One custom block is constructed to conform the field.  An isodose plan is reviewed and accepted with a Monte Carlo calculation.  I am prescribing 6000 cGy in 30 sessions utilizing 12 MEV electrons.  1.0 cm bolus is applied for each treatment.

## 2014-11-18 ENCOUNTER — Ambulatory Visit
Admission: RE | Admit: 2014-11-18 | Discharge: 2014-11-18 | Disposition: A | Discharge status: Home / Self Care | Payer: Medicare Other | Source: Ambulatory Visit | Attending: Radiation Oncology | Admitting: Radiation Oncology

## 2014-11-18 ENCOUNTER — Ambulatory Visit: Payer: Medicare Other | Admitting: Radiation Oncology

## 2014-11-19 ENCOUNTER — Ambulatory Visit: Payer: Medicare Other

## 2014-11-19 ENCOUNTER — Ambulatory Visit
Admission: RE | Admit: 2014-11-19 | Discharge: 2014-11-19 | Disposition: A | Discharge status: Home / Self Care | Payer: Medicare Other | Source: Ambulatory Visit | Attending: Radiation Oncology | Admitting: Radiation Oncology

## 2014-11-19 DIAGNOSIS — Z510 Encounter for antineoplastic radiation therapy: Secondary | ICD-10-CM | POA: Diagnosis not present

## 2014-11-19 DIAGNOSIS — C4442 Squamous cell carcinoma of skin of scalp and neck: Secondary | ICD-10-CM

## 2014-11-19 MED ORDER — SONAFINE EX EMUL: 1.0000 "application " | Freq: Two times a day (BID) | CUTANEOUS | Status: DC

## 2014-11-19 MED ORDER — SONAFINE EX EMUL
1.0000 "application " | Freq: Two times a day (BID) | CUTANEOUS | Status: DC | PRN
  Administered 2014-11-19: 1 via TOPICAL

## 2014-11-19 NOTE — Progress Notes (Signed)
[employer] [phone]

## 2014-11-19 NOTE — Progress Notes (Signed)
Pt here for patient teaching.  Pt given sonafine, Radiation and You booklet, Managing Acute Radiation Side Effects for Head and Neck Cancer handout, skin care instructions and Biafine. Pt reports they have watched, not watched and refused to watch the Radiation Therapy Education video on .  Reviewed areas of pertinence such as fatigue, mouth changes, skin changes and throat changes . Pt able to give teach back of to pat skin, use unscented/gentle soap and drink plenty of water,apply Biafine bid and avoid applying anything to skin within 4 hours of treatment. Pt demonstrated understanding and verbalizes understanding of information given and will contact nursing with any questions or concerns.    Has not sees Radition Therapy video.  Given link.

## 2014-11-20 ENCOUNTER — Ambulatory Visit
Admission: RE | Admit: 2014-11-20 | Discharge: 2014-11-20 | Disposition: A | Discharge status: Home / Self Care | Payer: Medicare Other | Source: Ambulatory Visit | Attending: Radiation Oncology | Admitting: Radiation Oncology

## 2014-11-20 DIAGNOSIS — Z510 Encounter for antineoplastic radiation therapy: Secondary | ICD-10-CM | POA: Diagnosis not present

## 2014-11-22 ENCOUNTER — Ambulatory Visit: Payer: Medicare Other

## 2014-11-23 ENCOUNTER — Ambulatory Visit
Admission: RE | Admit: 2014-11-23 | Discharge: 2014-11-23 | Disposition: A | Discharge status: Home / Self Care | Payer: Medicare Other | Source: Ambulatory Visit | Attending: Radiation Oncology | Admitting: Radiation Oncology

## 2014-11-23 ENCOUNTER — Ambulatory Visit: Payer: Medicare Other

## 2014-11-23 ENCOUNTER — Encounter: Payer: Self-pay | Admitting: Radiation Oncology

## 2014-11-23 VITALS — BP 124/57 | HR 81 | Temp 98.1°F | Ht 61.5 in | Wt 132.6 lb

## 2014-11-23 DIAGNOSIS — Z510 Encounter for antineoplastic radiation therapy: Secondary | ICD-10-CM | POA: Diagnosis not present

## 2014-11-23 DIAGNOSIS — C4442 Squamous cell carcinoma of skin of scalp and neck: Secondary | ICD-10-CM

## 2014-11-23 NOTE — Progress Notes (Signed)
Weekly Management Note:  Site: Right neck Current Dose:  600  cGy Projected Dose: 6000  cGy  Narrative: The patient is seen today for routine under treatment assessment. CBCT/MVCT images/port films were reviewed. The chart was reviewed.   She is without complaints today.  She had patient education today.  Physical Examination:  Filed Vitals:   11/23/14 1509  BP: 124/57  Pulse: 81  Temp:   .  Weight: 132 lb 9.6 oz (60.147 kg).  No skin changes.  No palpable right neck or facial/periauricular adenopathy.  Impression: Tolerating radiation therapy well.  Plan: Continue radiation therapy as planned.

## 2014-11-23 NOTE — Progress Notes (Signed)
Jamie Brown daughter reports that Jamie Brown seems to have memory issues today and is searching for words today.    She is received 3 fractions to her right upper neck and denies any sore throat or difficulty swallowing.  She is receiving only electrons so do not expect the aformentioned side effects.  [place] by wheelchair.

## 2014-11-24 ENCOUNTER — Ambulatory Visit
Admission: RE | Admit: 2014-11-24 | Discharge: 2014-11-24 | Disposition: A | Discharge status: Home / Self Care | Payer: Medicare Other | Source: Ambulatory Visit | Attending: Radiation Oncology | Admitting: Radiation Oncology

## 2014-11-24 DIAGNOSIS — Z510 Encounter for antineoplastic radiation therapy: Secondary | ICD-10-CM | POA: Diagnosis not present

## 2014-11-25 ENCOUNTER — Ambulatory Visit
Admission: RE | Admit: 2014-11-25 | Discharge: 2014-11-25 | Disposition: A | Discharge status: Home / Self Care | Payer: Medicare Other | Source: Ambulatory Visit | Attending: Radiation Oncology | Admitting: Radiation Oncology

## 2014-11-25 DIAGNOSIS — Z510 Encounter for antineoplastic radiation therapy: Secondary | ICD-10-CM | POA: Diagnosis not present

## 2014-11-26 ENCOUNTER — Ambulatory Visit
Admission: RE | Admit: 2014-11-26 | Discharge: 2014-11-26 | Disposition: A | Discharge status: Home / Self Care | Payer: Medicare Other | Source: Ambulatory Visit | Attending: Radiation Oncology | Admitting: Radiation Oncology

## 2014-11-26 DIAGNOSIS — Z510 Encounter for antineoplastic radiation therapy: Secondary | ICD-10-CM | POA: Diagnosis not present

## 2014-11-27 ENCOUNTER — Ambulatory Visit
Admission: RE | Admit: 2014-11-27 | Discharge: 2014-11-27 | Disposition: A | Discharge status: Home / Self Care | Payer: Medicare Other | Source: Ambulatory Visit | Attending: Radiation Oncology | Admitting: Radiation Oncology

## 2014-11-27 DIAGNOSIS — Z510 Encounter for antineoplastic radiation therapy: Secondary | ICD-10-CM | POA: Diagnosis not present

## 2014-11-30 ENCOUNTER — Ambulatory Visit
Admission: RE | Admit: 2014-11-30 | Discharge: 2014-11-30 | Disposition: A | Discharge status: Home / Self Care | Payer: Medicare Other | Source: Ambulatory Visit | Attending: Radiation Oncology | Admitting: Radiation Oncology

## 2014-11-30 ENCOUNTER — Encounter: Payer: Self-pay | Admitting: Radiation Oncology

## 2014-11-30 VITALS — BP 115/52 | HR 78 | Temp 98.2°F | Resp 12 | Wt 134.2 lb

## 2014-11-30 DIAGNOSIS — C4442 Squamous cell carcinoma of skin of scalp and neck: Secondary | ICD-10-CM

## 2014-11-30 DIAGNOSIS — Z510 Encounter for antineoplastic radiation therapy: Secondary | ICD-10-CM | POA: Diagnosis not present

## 2014-11-30 NOTE — Progress Notes (Signed)
She is currently in no pain. Pt complains of  Fatigue.  Skin slight erythema - right face/ear.  Apply Sonafine as directed. She needs assistance with application.  Noted facial makeup over treatment field encouraged not to apply to this area.    BP 115/52 mmHg  Pulse 78  Temp(Src) 98.2 F (36.8 C) (Oral)  Resp 12  Wt 134 lb 3.2 oz (60.873 kg)  SpO2 95%

## 2014-11-30 NOTE — Progress Notes (Signed)
Weekly Management Note:  Site: Upper right neck Current Dose:  1600  cGy Projected Dose: 6000  cGy  Narrative: The patient is seen today for routine under treatment assessment. CBCT/MVCT images/port films were reviewed. The chart was reviewed.   She is without complaints today.  She has not yet started Sonafine cream.  Physical Examination:  Filed Vitals:   11/30/14 1515  BP: 115/52  Pulse: 78  Temp: 98.2 F (36.8 C)  Resp: 12  .  Weight: 134 lb 3.2 oz (60.873 kg).  There are no significant skin changes.  Impression: Tolerating radiation therapy well.  Plan: Continue radiation therapy as planned.

## 2014-12-01 ENCOUNTER — Ambulatory Visit
Admission: RE | Admit: 2014-12-01 | Discharge: 2014-12-01 | Disposition: A | Discharge status: Home / Self Care | Payer: Medicare Other | Source: Ambulatory Visit | Attending: Radiation Oncology | Admitting: Radiation Oncology

## 2014-12-01 DIAGNOSIS — Z510 Encounter for antineoplastic radiation therapy: Secondary | ICD-10-CM | POA: Diagnosis not present

## 2014-12-02 ENCOUNTER — Ambulatory Visit
Admission: RE | Admit: 2014-12-02 | Discharge: 2014-12-02 | Disposition: A | Discharge status: Home / Self Care | Payer: Medicare Other | Source: Ambulatory Visit | Attending: Radiation Oncology | Admitting: Radiation Oncology

## 2014-12-02 DIAGNOSIS — Z510 Encounter for antineoplastic radiation therapy: Secondary | ICD-10-CM | POA: Diagnosis not present

## 2014-12-03 ENCOUNTER — Ambulatory Visit
Admission: RE | Admit: 2014-12-03 | Discharge: 2014-12-03 | Disposition: A | Discharge status: Home / Self Care | Payer: Medicare Other | Source: Ambulatory Visit | Attending: Radiation Oncology | Admitting: Radiation Oncology

## 2014-12-03 DIAGNOSIS — Z510 Encounter for antineoplastic radiation therapy: Secondary | ICD-10-CM | POA: Diagnosis not present

## 2014-12-04 ENCOUNTER — Ambulatory Visit
Admission: RE | Admit: 2014-12-04 | Discharge: 2014-12-04 | Disposition: A | Discharge status: Home / Self Care | Payer: Medicare Other | Source: Ambulatory Visit | Attending: Radiation Oncology | Admitting: Radiation Oncology

## 2014-12-04 DIAGNOSIS — Z510 Encounter for antineoplastic radiation therapy: Secondary | ICD-10-CM | POA: Diagnosis not present

## 2014-12-05 ENCOUNTER — Ambulatory Visit: Payer: Medicare Other | Attending: Radiation Oncology | Admitting: Radiation Oncology

## 2014-12-07 ENCOUNTER — Ambulatory Visit
Admission: RE | Admit: 2014-12-07 | Discharge: 2014-12-07 | Disposition: A | Discharge status: Home / Self Care | Payer: Medicare Other | Source: Ambulatory Visit | Attending: Radiation Oncology | Admitting: Radiation Oncology

## 2014-12-07 ENCOUNTER — Encounter: Payer: Self-pay | Admitting: Radiation Oncology

## 2014-12-07 VITALS — BP 133/48 | HR 81 | Temp 97.6°F | Resp 12 | Wt 135.4 lb

## 2014-12-07 DIAGNOSIS — C4442 Squamous cell carcinoma of skin of scalp and neck: Secondary | ICD-10-CM

## 2014-12-07 DIAGNOSIS — Z510 Encounter for antineoplastic radiation therapy: Secondary | ICD-10-CM | POA: Diagnosis not present

## 2014-12-07 NOTE — Progress Notes (Signed)
RADIATION TREATMENT      RECOMMENDATIONS: ? Use unscented soap (Dove) ? When showering it is fine for water to touch the area, but please avoid direct spray on the treatment field if skin becomes irritated.  Also, wash inside and around the marked area ? When drying gently blot the area  ? PLEASE DO NOT APPLY ANY OTHER CREAMS, LOTIONS, POWDERS, PERFUMES, OILS OR ALCOHOL PRODUCTS (OTHER THAN WHAT IS GIVEN TO YOU BY THE RADIATION TEAM) TO THE TREATMENT AREA DURING RADIATION THERAPY   SKIN CARE: ? Moisturizer o You will be given (SONAFINE) to use. Apply twice daily, once after treatment and then again prior to bedtime o Your Radiation Oncologist may suggest other skin care products as needed   PLEASE DO NOT APPLY THE [place] 4 HOURS PRIOR TO RADIATION TREATMENT

## 2014-12-07 NOTE — Progress Notes (Signed)
Weekly Management Note:  Site: Upper right neck Current Dose:  2600  cGy Projected Dose: 6000  cGy  Narrative: The patient is seen today for routine under treatment assessment. CBCT/MVCT images/port films were reviewed. The chart was reviewed.   She still doing well except for slight pruritus and skin sensitivity.  She uses Sonafine cream.  Physical Examination:  Filed Vitals:   12/07/14 1428  BP: 133/48  Pulse: 81  Temp: 97.6 F (36.4 C)  Resp: 12  .  Weight: 135 lb 6.4 oz (61.417 kg).  There is splotchy erythema with what I believe to be areas of inflamed actinic keratoses.  There is no desquamation.  No adenopathy.  Impression: Tolerating radiation therapy well.  Plan: Continue radiation therapy as planned.

## 2014-12-07 NOTE — Progress Notes (Signed)
She is currently in no pain. Pt complains of skin sensitiveness and itching. Noted slight erythema, and dried skin. Pt continues to apply Sonafine to face bid.   BP 133/48 mmHg  Pulse 81  Temp(Src) 97.6 F (36.4 C) (Oral)  Resp 12  Wt 135 lb 6.4 oz (61.417 kg)  SpO2 96%   Wt Readings from Last 3 Encounters:  12/07/14 135 lb 6.4 oz (61.417 kg)  11/30/14 134 lb 3.2 oz (60.873 kg)  11/23/14 132 lb 9.6 oz (60.147 kg)

## 2014-12-08 ENCOUNTER — Ambulatory Visit
Admission: RE | Admit: 2014-12-08 | Discharge: 2014-12-08 | Disposition: A | Discharge status: Home / Self Care | Payer: Medicare Other | Source: Ambulatory Visit | Attending: Radiation Oncology | Admitting: Radiation Oncology

## 2014-12-08 DIAGNOSIS — Z510 Encounter for antineoplastic radiation therapy: Secondary | ICD-10-CM | POA: Diagnosis not present

## 2014-12-09 ENCOUNTER — Ambulatory Visit
Admission: RE | Admit: 2014-12-09 | Discharge: 2014-12-09 | Disposition: A | Discharge status: Home / Self Care | Payer: Medicare Other | Source: Ambulatory Visit | Attending: Radiation Oncology | Admitting: Radiation Oncology

## 2014-12-09 DIAGNOSIS — Z510 Encounter for antineoplastic radiation therapy: Secondary | ICD-10-CM | POA: Diagnosis not present

## 2014-12-10 ENCOUNTER — Ambulatory Visit
Admission: RE | Admit: 2014-12-10 | Discharge: 2014-12-10 | Disposition: A | Discharge status: Home / Self Care | Payer: Medicare Other | Source: Ambulatory Visit | Attending: Radiation Oncology | Admitting: Radiation Oncology

## 2014-12-10 DIAGNOSIS — Z510 Encounter for antineoplastic radiation therapy: Secondary | ICD-10-CM | POA: Diagnosis not present

## 2014-12-11 ENCOUNTER — Ambulatory Visit
Admission: RE | Admit: 2014-12-11 | Discharge: 2014-12-11 | Disposition: A | Discharge status: Home / Self Care | Payer: Medicare Other | Source: Ambulatory Visit | Attending: Radiation Oncology | Admitting: Radiation Oncology

## 2014-12-11 DIAGNOSIS — Z510 Encounter for antineoplastic radiation therapy: Secondary | ICD-10-CM | POA: Diagnosis not present

## 2014-12-12 ENCOUNTER — Ambulatory Visit: Payer: Medicare Other

## 2014-12-14 ENCOUNTER — Ambulatory Visit
Admission: RE | Admit: 2014-12-14 | Discharge: 2014-12-14 | Disposition: A | Discharge status: Home / Self Care | Payer: Medicare Other | Source: Ambulatory Visit | Attending: Radiation Oncology | Admitting: Radiation Oncology

## 2014-12-14 ENCOUNTER — Encounter: Payer: Self-pay | Admitting: Radiation Oncology

## 2014-12-14 VITALS — BP 122/58 | HR 82 | Temp 98.2°F | Resp 12

## 2014-12-14 DIAGNOSIS — Z510 Encounter for antineoplastic radiation therapy: Secondary | ICD-10-CM | POA: Diagnosis not present

## 2014-12-14 DIAGNOSIS — C4442 Squamous cell carcinoma of skin of scalp and neck: Secondary | ICD-10-CM

## 2014-12-14 NOTE — Progress Notes (Signed)
She is currently in no pain. Pt complains of skin sensitiveness, erythema, small area of dry yellow crusting along jaw line. She continues to apply Sonafine as directed.  Encouraged application when necessary and cleansing of treatment site.    BP 122/58 mmHg  Pulse 82  Temp(Src) 98.2 F (36.8 C) (Oral)  Resp 12  SpO2 97%

## 2014-12-14 NOTE — Progress Notes (Signed)
Weekly Management Note:  Site: Upper right neck Current Dose:  3600  cGy Projected Dose: 6000  cGy  Narrative: The patient is seen today for routine under treatment assessment. CBCT/MVCT images/port films were reviewed. The chart was reviewed.   She is joined doing well although she does have some skin sensitivity along her upper right neck. She uses Sonafine cream.  Physical Examination:  Filed Vitals:   12/14/14 1534  BP: 122/58  Pulse: 82  Temp: 98.2 F (36.8 C)  Resp: 12  .  Weight:  .  There is mild to moderate erythema within her treatment field.  There is some crusting of her actinic keratoses.  On inspection oral cavity there is slight mucositis along the retromolar trigone region.  Impression: Tolerating radiation therapy well.  Plan: Continue radiation therapy as planned.

## 2014-12-15 ENCOUNTER — Ambulatory Visit
Admission: RE | Admit: 2014-12-15 | Discharge: 2014-12-15 | Disposition: A | Discharge status: Home / Self Care | Payer: Medicare Other | Source: Ambulatory Visit | Attending: Radiation Oncology | Admitting: Radiation Oncology

## 2014-12-15 ENCOUNTER — Encounter: Payer: Self-pay | Admitting: Radiation Oncology

## 2014-12-15 DIAGNOSIS — Z510 Encounter for antineoplastic radiation therapy: Secondary | ICD-10-CM | POA: Diagnosis not present

## 2014-12-15 NOTE — Progress Notes (Signed)
Complex electron beam simulation note: The patient underwent virtual simulation for her reduced right upper neck field with electrons.  She is set up en face.  One custom block is constructed to conform the field.  Her isodose plan with [facility] calculation is reviewed and accepted.  I prescribing a further 1000 cGy in 5 sessions for cumulative dose of 6000 cGy in 30 sessions.  She is being treated with 12 MEV electrons along with continuation of 1.0 cm bolus daily.

## 2014-12-16 ENCOUNTER — Ambulatory Visit: Payer: Medicare Other

## 2014-12-16 ENCOUNTER — Ambulatory Visit
Admission: RE | Admit: 2014-12-16 | Discharge: 2014-12-16 | Disposition: A | Discharge status: Home / Self Care | Payer: Medicare Other | Source: Ambulatory Visit | Attending: Radiation Oncology | Admitting: Radiation Oncology

## 2014-12-16 DIAGNOSIS — Z510 Encounter for antineoplastic radiation therapy: Secondary | ICD-10-CM | POA: Diagnosis not present

## 2014-12-21 ENCOUNTER — Ambulatory Visit
Admission: RE | Admit: 2014-12-21 | Discharge: 2014-12-21 | Disposition: A | Discharge status: Home / Self Care | Payer: Medicare Other | Source: Ambulatory Visit | Attending: Radiation Oncology | Admitting: Radiation Oncology

## 2014-12-21 ENCOUNTER — Encounter: Payer: Self-pay | Admitting: Radiation Oncology

## 2014-12-21 VITALS — BP 115/45 | HR 75 | Temp 98.5°F | Resp 12 | Wt 135.6 lb

## 2014-12-21 DIAGNOSIS — Z510 Encounter for antineoplastic radiation therapy: Secondary | ICD-10-CM | POA: Diagnosis not present

## 2014-12-21 DIAGNOSIS — C4442 Squamous cell carcinoma of skin of scalp and neck: Secondary | ICD-10-CM

## 2014-12-21 NOTE — Progress Notes (Signed)
She rates her pain as a 3 on a scale of 0-10, burning, constant Pt complains of skin sensitiveness and itching,erythema, and crusting. Continues to apply the sonafine.   BP 115/45 mmHg  Pulse 75  Temp(Src) 98.5 F (36.9 C) (Oral)  Resp 12  Wt 135 lb 9.6 oz (61.508 kg)  SpO2 95% Wt Readings from Last 3 Encounters:  12/21/14 135 lb 9.6 oz (61.508 kg)  12/07/14 135 lb 6.4 oz (61.417 kg)  11/30/14 134 lb 3.2 oz (60.873 kg)

## 2014-12-21 NOTE — Progress Notes (Signed)
Weekly Management Note:  Site: upper right neck Current Dose:   4200  cGy Projected Dose:  6000  cGy  Narrative: The patient is seen today for routine under treatment assessment. CBCT/MVCT images/port films were reviewed. The chart was reviewed.    She is without new complaints today.  She does have increased skin sensitivity and pruritus within her treatment field. She uses Sonafine cream. No change in her taste or oral cavity discomfort.  Physical Examination:  Filed Vitals:   12/21/14 1428  BP: 115/45  Pulse: 75  Temp: 98.5 F (36.9 C)  Resp: 12  .  Weight: 135 lb 9.6 oz (61.508 kg).  There is marked erythema along the upper right neck with some crusting of the skin. There is patchy dry desquamation but no areas of moist desquamation. No masses are appreciated.  Impression: Tolerating radiation therapy well.  Plan: Continue radiation therapy as planned.

## 2014-12-22 ENCOUNTER — Ambulatory Visit
Admission: RE | Admit: 2014-12-22 | Discharge: 2014-12-22 | Disposition: A | Discharge status: Home / Self Care | Payer: Medicare Other | Source: Ambulatory Visit | Attending: Radiation Oncology | Admitting: Radiation Oncology

## 2014-12-22 DIAGNOSIS — Z510 Encounter for antineoplastic radiation therapy: Secondary | ICD-10-CM | POA: Diagnosis not present

## 2014-12-23 ENCOUNTER — Ambulatory Visit
Admission: RE | Admit: 2014-12-23 | Discharge: 2014-12-23 | Disposition: A | Discharge status: Home / Self Care | Payer: Medicare Other | Source: Ambulatory Visit | Attending: Radiation Oncology | Admitting: Radiation Oncology

## 2014-12-23 DIAGNOSIS — Z510 Encounter for antineoplastic radiation therapy: Secondary | ICD-10-CM | POA: Diagnosis not present

## 2014-12-24 ENCOUNTER — Ambulatory Visit
Admission: RE | Admit: 2014-12-24 | Discharge: 2014-12-24 | Disposition: A | Discharge status: Home / Self Care | Payer: Medicare Other | Source: Ambulatory Visit | Attending: Radiation Oncology | Admitting: Radiation Oncology

## 2014-12-24 DIAGNOSIS — Z510 Encounter for antineoplastic radiation therapy: Secondary | ICD-10-CM | POA: Diagnosis not present

## 2014-12-25 ENCOUNTER — Ambulatory Visit
Admission: RE | Admit: 2014-12-25 | Discharge: 2014-12-25 | Disposition: A | Discharge status: Home / Self Care | Payer: Medicare Other | Source: Ambulatory Visit | Attending: Radiation Oncology | Admitting: Radiation Oncology

## 2014-12-25 DIAGNOSIS — Z510 Encounter for antineoplastic radiation therapy: Secondary | ICD-10-CM | POA: Diagnosis not present

## 2014-12-28 ENCOUNTER — Ambulatory Visit
Admission: RE | Admit: 2014-12-28 | Discharge: 2014-12-28 | Disposition: A | Discharge status: Home / Self Care | Payer: Medicare Other | Source: Ambulatory Visit | Attending: Radiation Oncology | Admitting: Radiation Oncology

## 2014-12-28 ENCOUNTER — Encounter: Payer: Self-pay | Admitting: Radiation Oncology

## 2014-12-28 VITALS — BP 118/63 | HR 62 | Temp 98.4°F | Resp 16 | Wt 137.2 lb

## 2014-12-28 DIAGNOSIS — Z510 Encounter for antineoplastic radiation therapy: Secondary | ICD-10-CM | POA: Diagnosis not present

## 2014-12-28 DIAGNOSIS — C4442 Squamous cell carcinoma of skin of scalp and neck: Secondary | ICD-10-CM

## 2014-12-28 NOTE — Progress Notes (Signed)
Weekly Management Note:  Site: right upper neck Current Dose:   5200  cGy Projected Dose:  6000  cGy  Narrative: The patient is seen today for routine under treatment assessment. CBCT/MVCT images/port films were reviewed. The chart was reviewed.    She still doing well but is having more discomfort along her right neck. She is also fatigue. She uses Sonafine cream.  Physical Examination:  Filed Vitals:   12/28/14 1446  BP: 118/63  Pulse: 62  Temp: 98.4 F (36.9 C)  Resp: 16  .  Weight: 137 lb 3.2 oz (62.234 kg).  There is marked erythema with extensive patchy dry desquamation with areas of keratosis. No moist desquamation. No masses are appreciated.  Impression: Tolerating radiation therapy well.  I will check her skin again this Wednesday and determine whether not to continue her treatment up to 6000 cGy.  Plan: Continue radiation therapy as planned.

## 2014-12-28 NOTE — Progress Notes (Signed)
weekly rad txs  26/30 right neck, erythma,   ,scabbed areas, mild itchiness stated,  sonafine cream daily applied, sore throat at night time, no difficulty swallowing foods or fluids,  Weakness poor energy 2:49 PM BP 118/63 mmHg  Pulse 62  Temp(Src) 98.4 F (36.9 C) (Oral)  Resp 16  Wt 137 lb 3.2 oz (62.234 kg)  SpO2 96%  Wt Readings from Last 3 Encounters:  12/28/14 137 lb 3.2 oz (62.234 kg)  12/21/14 135 lb 9.6 oz (61.508 kg)  12/07/14 135 lb 6.4 oz (61.417 kg)

## 2014-12-29 ENCOUNTER — Ambulatory Visit
Admission: RE | Admit: 2014-12-29 | Discharge: 2014-12-29 | Disposition: A | Discharge status: Home / Self Care | Payer: Medicare Other | Source: Ambulatory Visit | Attending: Radiation Oncology | Admitting: Radiation Oncology

## 2014-12-29 DIAGNOSIS — Z510 Encounter for antineoplastic radiation therapy: Secondary | ICD-10-CM | POA: Diagnosis not present

## 2014-12-30 ENCOUNTER — Ambulatory Visit
Admission: RE | Admit: 2014-12-30 | Discharge: 2014-12-30 | Disposition: A | Discharge status: Home / Self Care | Payer: Medicare Other | Source: Ambulatory Visit | Attending: Radiation Oncology | Admitting: Radiation Oncology

## 2014-12-30 ENCOUNTER — Encounter: Payer: Self-pay | Admitting: Radiation Oncology

## 2014-12-30 DIAGNOSIS — Z510 Encounter for antineoplastic radiation therapy: Secondary | ICD-10-CM | POA: Diagnosis not present

## 2014-12-30 NOTE — Progress Notes (Signed)
Weekly Management Note:  Site: Upper right neck Current Dose:  5600  cGy Projected Dose: 6000  cGy  Narrative: The patient is seen today for routine under treatment assessment. CBCT/MVCT images/port films were reviewed. The chart was reviewed.   She continues to use Sonafine cream along her right neck.  She is having more discomfort.  Physical Examination: There were no vitals filed for this visit..  Weight:  .  There is extensive dry desquamation along with impending moist desquamation along the right neck.  Oral cavity is unremarkable.  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  She'll be scheduled see Dr. Sondra Come for a follow-up visit in January.  She is to contact me if she develops a moist desquamation within the next one to 2 weeks.  This was discussed with her daughter.  Plan: Continue radiation therapy as planned.

## 2014-12-30 NOTE — Progress Notes (Signed)
[facility] End of Treatment Note  Name:Jireh DAYTON STEPHNEY  Date: 12/30/2014 [id number] DOB:01/15/1925   Status:outpatient    CC: Javier Glazier, MD  Dr. Janann August  REFERRING PHYSICIAN: Dr. Janann August   DIAGNOSIS:  Squamous cell carcinoma of the skin, upper right neck with perineural invasion  INDICATION FOR TREATMENT: Curative   TREATMENT DATES: 11/19/2014 through 12/30/2014                          SITE/DOSE:  Upper right neck 5000 cGy in 25 sessions, reduced field right upper neck 600 cGy in 3 sessions                          BEAMS/ENERGY: En face with 12 MEV electrons with 1.0 cm bolus to maximize the dose to the skin surface, but to also treat the parotid bed.                 NARRATIVE: She tolerated her treatment reasonably well although she developed extensive dry desquamation the skin with impending moist desquamation and therefore I stopped her treatment of 5600 cGy in 23 sessions will 6000 cGy in 25 sessions.                           PLAN: Routine followup in one month. Patient instructed to call if questions or worsening complaints in interim.

## 2014-12-31 ENCOUNTER — Ambulatory Visit: Payer: Medicare Other

## 2015-01-01 ENCOUNTER — Ambulatory Visit: Payer: Medicare Other

## 2015-01-27 ENCOUNTER — Ambulatory Visit
Admission: RE | Admit: 2015-01-27 | Discharge: 2015-01-27 | Disposition: A | Discharge status: Home / Self Care | Payer: Medicare Other | Source: Ambulatory Visit | Attending: Radiation Oncology | Admitting: Radiation Oncology

## 2015-01-27 ENCOUNTER — Encounter: Payer: Self-pay | Admitting: Oncology

## 2015-01-27 VITALS — BP 128/58 | HR 77 | Temp 98.3°F | Ht 61.5 in | Wt 140.7 lb

## 2015-01-27 DIAGNOSIS — C4442 Squamous cell carcinoma of skin of scalp and neck: Secondary | ICD-10-CM

## 2015-01-27 HISTORY — DX: Reserved for inherently not codable concepts without codable children: IMO0001

## 2015-01-27 HISTORY — DX: Reserved for concepts with insufficient information to code with codable children: IMO0002

## 2015-01-27 NOTE — Progress Notes (Signed)
Radiation Oncology         [phone] ________________________________  Name: Jamie Brown MRN: [id number]  Date: 01/27/2015  DOB: 1924-07-29  Follow-Up Visit Note  CC: Javier Glazier, MD  Janann August, MD   Diagnosis:   Squamous cell carcinoma of the skin, upper right neck with perineural invasion.  Interval Since Last Radiation:  1   months. Completed 11/19/2014 - 12/30/2014 to upper neck 5000 cGy in 25 sessions, reduced field right upper neck 600 cGy in 3 sessions. The patient was treated by Dr. Arloa Koh.  Narrative:  The patient returns today for routine follow-up. She reports having generalized arthritis pain today. She alleviates this with tramadol twice daily, once in the morning and once in the afternoon. She reports having a dry mouth which has improved. She denies having a sore throat or trouble swallowing. She started using Aldara on her forehead and nose 3 times a week for 5 weeks. The skin on her right neck is pink and intact. She is using a moisture daily. She is here with her daughter.  ALLERGIES:  is allergic to codeine; gabapentin; isoptin; remeron; and trazodone and nefazodone.  Meds: Current Outpatient Prescriptions  Medication Sig Dispense Refill  . aspirin 81 MG tablet Take 81 mg by mouth daily.    Marland Kitchen CRANBERRY EXTRACT PO Take 25,000 mg by mouth.    . Fish Oil OIL 1 capsule by Does not apply route 1 day or 1 dose.     Marland Kitchen GLUCOSAMINE CHONDROITIN COMPLX PO Take 1,000 mg by mouth 1 day or 1 dose.    . imiquimod (ALDARA) 5 % cream     . LORazepam (ATIVAN) 0.5 MG tablet Take 0.5 mg by mouth 1 day or 1 dose. Nighlty    . Multiple Vitamin (MULTIVITAMIN) tablet Take 1 tablet by mouth daily.    Marland Kitchen NIFEdipine (PROCARDIA XL/ADALAT-CC) 60 MG 24 hr tablet Take 60 mg by mouth daily.    Marland Kitchen POTASSIUM CHLORIDE ER PO Take by mouth.    Marland Kitchen PRESCRIPTION MEDICATION Place 1 drop into both eyes once. Nightly    . Sertraline HCl (ZOLOFT PO) Take 50 mg by mouth.     . simvastatin  (ZOCOR) 40 MG tablet Take 40 mg by mouth daily.    . Tetrahydrozoline HCl (EYE DROPS OP) Apply to eye.    . TORSEMIDE PO Take 10 mg by mouth.     . traMADol (ULTRAM) 50 MG tablet Take 50 mg by mouth 2 (two) times daily as needed.    Marland Kitchen UNABLE TO FIND 1 drop at bedtime. Med Name: Zioptin 1 drop each eye at bedtime    . zolpidem (AMBIEN) 5 MG tablet Take 15 mg by mouth at bedtime as needed for sleep.      No current facility-administered medications for this encounter.    Physical Findings: The patient is in no acute distress. Patient is alert and oriented.  height is 5' 1.5" (1.562 m) and weight is 140 lb 11.2 oz (63.821 kg). Her oral temperature is 98.3 F (36.8 C). Her blood pressure is 128/58 and her pulse is 77. Her oxygen saturation is 94%. .  No significant changes. The incision on her neck is healed well with no drainage. No signs of residual cancer or skin breakdown.   Lab Findings: Lab Results  Component Value Date   WBC 10.1 09/10/2008   HGB 11.1 DELTA CHECK NOTED* 09/10/2008   HCT 32.7* 09/10/2008   MCV 90.8 09/10/2008  PLT 275 09/10/2008    Radiographic Findings: No results found.  Impression:  The patient has recovered from the effects of radiation. No signs of residual cancer or recurrence in the treatment site  Plan: She has an appointment scheduled with the Dermatologist in February and prefers to follow up with them. I will follow up with her on an as needed basis.  ____________________________________ -----------------------------------  Blair Promise, PhD, MD     This document serves as a record of services personally performed by Gery Pray, MD. It was created on his behalf by Lendon Collar, a trained medical scribe. The creation of this record is based on the scribe's personal observations and the provider's statements to them. This document has been checked and approved by the attending provider.

## 2015-01-27 NOTE — Progress Notes (Addendum)
Jamie Brown here for follow up.  She reports having generalized arthritis pain today.  She reports having a dry mouth which has improved.  She denies having a sore throat or trouble swallowing.  She started using Aldara on her forehead and nose 3 times a week for 5 weeks.  The skin on her right neck is pink and intact.  She is using a moisture daily.  She is here with her daughter.  BP 128/58 mmHg  Pulse 77  Temp(Src) 98.3 F (36.8 C) (Oral)  Ht 5' 1.5" (1.562 m)  Wt 140 lb 11.2 oz (63.821 kg)  BMI 26.16 kg/m2  SpO2 94%

## 2015-05-28 ENCOUNTER — Inpatient Hospital Stay (HOSPITAL_BASED_OUTPATIENT_CLINIC_OR_DEPARTMENT_OTHER)
Admission: EM | Admit: 2015-05-28 | Discharge: 2015-06-01 | DRG: 872 | Disposition: A | Discharge status: Home / Self Care | Payer: Medicare Other | Attending: Internal Medicine | Admitting: Internal Medicine

## 2015-05-28 ENCOUNTER — Encounter (HOSPITAL_BASED_OUTPATIENT_CLINIC_OR_DEPARTMENT_OTHER): Payer: Self-pay | Admitting: *Deleted

## 2015-05-28 ENCOUNTER — Emergency Department (HOSPITAL_BASED_OUTPATIENT_CLINIC_OR_DEPARTMENT_OTHER): Payer: Medicare Other

## 2015-05-28 DIAGNOSIS — I10 Essential (primary) hypertension: Secondary | ICD-10-CM | POA: Diagnosis present

## 2015-05-28 DIAGNOSIS — R7881 Bacteremia: Secondary | ICD-10-CM | POA: Diagnosis not present

## 2015-05-28 DIAGNOSIS — Z885 Allergy status to narcotic agent status: Secondary | ICD-10-CM

## 2015-05-28 DIAGNOSIS — Z7982 Long term (current) use of aspirin: Secondary | ICD-10-CM | POA: Diagnosis not present

## 2015-05-28 DIAGNOSIS — M549 Dorsalgia, unspecified: Secondary | ICD-10-CM | POA: Diagnosis present

## 2015-05-28 DIAGNOSIS — R509 Fever, unspecified: Secondary | ICD-10-CM | POA: Diagnosis not present

## 2015-05-28 DIAGNOSIS — Z923 Personal history of irradiation: Secondary | ICD-10-CM | POA: Diagnosis not present

## 2015-05-28 DIAGNOSIS — A419 Sepsis, unspecified organism: Secondary | ICD-10-CM | POA: Diagnosis not present

## 2015-05-28 DIAGNOSIS — F419 Anxiety disorder, unspecified: Secondary | ICD-10-CM | POA: Diagnosis present

## 2015-05-28 DIAGNOSIS — E872 Acidosis, unspecified: Secondary | ICD-10-CM | POA: Diagnosis present

## 2015-05-28 DIAGNOSIS — Z8249 Family history of ischemic heart disease and other diseases of the circulatory system: Secondary | ICD-10-CM | POA: Diagnosis not present

## 2015-05-28 DIAGNOSIS — K5900 Constipation, unspecified: Secondary | ICD-10-CM | POA: Diagnosis present

## 2015-05-28 DIAGNOSIS — G8929 Other chronic pain: Secondary | ICD-10-CM | POA: Diagnosis present

## 2015-05-28 DIAGNOSIS — R739 Hyperglycemia, unspecified: Secondary | ICD-10-CM | POA: Diagnosis present

## 2015-05-28 DIAGNOSIS — I2510 Atherosclerotic heart disease of native coronary artery without angina pectoris: Secondary | ICD-10-CM | POA: Diagnosis present

## 2015-05-28 DIAGNOSIS — B369 Superficial mycosis, unspecified: Secondary | ICD-10-CM | POA: Diagnosis present

## 2015-05-28 DIAGNOSIS — Z886 Allergy status to analgesic agent status: Secondary | ICD-10-CM | POA: Diagnosis not present

## 2015-05-28 DIAGNOSIS — E785 Hyperlipidemia, unspecified: Secondary | ICD-10-CM | POA: Diagnosis present

## 2015-05-28 DIAGNOSIS — G4700 Insomnia, unspecified: Secondary | ICD-10-CM | POA: Diagnosis present

## 2015-05-28 DIAGNOSIS — Z79891 Long term (current) use of opiate analgesic: Secondary | ICD-10-CM

## 2015-05-28 DIAGNOSIS — C4442 Squamous cell carcinoma of skin of scalp and neck: Secondary | ICD-10-CM | POA: Diagnosis present

## 2015-05-28 DIAGNOSIS — Z87440 Personal history of urinary (tract) infections: Secondary | ICD-10-CM

## 2015-05-28 DIAGNOSIS — E7800 Pure hypercholesterolemia, unspecified: Secondary | ICD-10-CM | POA: Diagnosis present

## 2015-05-28 DIAGNOSIS — F329 Major depressive disorder, single episode, unspecified: Secondary | ICD-10-CM | POA: Diagnosis present

## 2015-05-28 DIAGNOSIS — N390 Urinary tract infection, site not specified: Secondary | ICD-10-CM | POA: Diagnosis present

## 2015-05-28 DIAGNOSIS — Z888 Allergy status to other drugs, medicaments and biological substances status: Secondary | ICD-10-CM

## 2015-05-28 DIAGNOSIS — A4151 Sepsis due to Escherichia coli [E. coli]: Principal | ICD-10-CM | POA: Diagnosis present

## 2015-05-28 HISTORY — DX: Atherosclerotic heart disease of native coronary artery without angina pectoris: I25.10

## 2015-05-28 HISTORY — DX: Essential (primary) hypertension: I10

## 2015-05-28 LAB — CBC WITH DIFFERENTIAL/PLATELET
BASOS ABS: 0 10*3/uL (ref 0.0–0.1)
BASOS PCT: 0 %
EOS PCT: 0 %
Eosinophils Absolute: 0 10*3/uL (ref 0.0–0.7)
HEMATOCRIT: 34.7 % — AB (ref 36.0–46.0)
Hemoglobin: 11.5 g/dL — ABNORMAL LOW (ref 12.0–15.0)
Lymphocytes Relative: 4 %
Lymphs Abs: 0.4 10*3/uL — ABNORMAL LOW (ref 0.7–4.0)
MCH: 27.7 pg (ref 26.0–34.0)
MCHC: 33.1 g/dL (ref 30.0–36.0)
MCV: 83.6 fL (ref 78.0–100.0)
MONO ABS: 0.6 10*3/uL (ref 0.1–1.0)
Monocytes Relative: 5 %
NEUTROS ABS: 9.7 10*3/uL — AB (ref 1.7–7.7)
Neutrophils Relative %: 91 %
PLATELETS: 295 10*3/uL (ref 150–400)
RBC: 4.15 MIL/uL (ref 3.87–5.11)
RDW: 15.1 % (ref 11.5–15.5)
WBC: 10.7 10*3/uL — AB (ref 4.0–10.5)

## 2015-05-28 LAB — URINALYSIS, ROUTINE W REFLEX MICROSCOPIC
Bilirubin Urine: NEGATIVE
Glucose, UA: 500 mg/dL — AB
Ketones, ur: NEGATIVE mg/dL
NITRITE: NEGATIVE
PROTEIN: NEGATIVE mg/dL
Specific Gravity, Urine: 1.01 (ref 1.005–1.030)
pH: 6.5 (ref 5.0–8.0)

## 2015-05-28 LAB — CREATININE, SERUM
Creatinine, Ser: 0.84 mg/dL (ref 0.44–1.00)
GFR calc non Af Amer: >60 mL/min (ref >60)

## 2015-05-28 LAB — COMPREHENSIVE METABOLIC PANEL
ALBUMIN: 3.1 g/dL — AB (ref 3.5–5.0)
ALK PHOS: 88 U/L (ref 38–126)
ALT: 39 U/L (ref 14–54)
ANION GAP: 10 (ref 5–15)
AST: 42 U/L — ABNORMAL HIGH (ref 15–41)
BILIRUBIN TOTAL: 0.4 mg/dL (ref 0.3–1.2)
BUN: 19 mg/dL (ref 6–20)
CALCIUM: 8.7 mg/dL — AB (ref 8.9–10.3)
CO2: 24 mmol/L (ref 22–32)
Chloride: 99 mmol/L — ABNORMAL LOW (ref 101–111)
Creatinine, Ser: 0.87 mg/dL (ref 0.44–1.00)
GFR calc Af Amer: >60 mL/min (ref >60)
GFR, EST NON AFRICAN AMERICAN: 58 mL/min — AB (ref >60)
GLUCOSE: 277 mg/dL — AB (ref 65–99)
Potassium: 3.2 mmol/L — ABNORMAL LOW (ref 3.5–5.1)
Sodium: 133 mmol/L — ABNORMAL LOW (ref 135–145)
TOTAL PROTEIN: 6.9 g/dL (ref 6.5–8.1)

## 2015-05-28 LAB — TSH: TSH: 1.843 u[IU]/mL (ref 0.350–4.500)

## 2015-05-28 LAB — CBC
HEMATOCRIT: 32.1 % — AB (ref 36.0–46.0)
HEMOGLOBIN: 10.7 g/dL — AB (ref 12.0–15.0)
MCH: 27.6 pg (ref 26.0–34.0)
MCHC: 33.3 g/dL (ref 30.0–36.0)
MCV: 82.9 fL (ref 78.0–100.0)
Platelets: 286 10*3/uL (ref 150–400)
RBC: 3.87 MIL/uL (ref 3.87–5.11)
RDW: 15.3 % (ref 11.5–15.5)
WBC: 11.5 10*3/uL — AB (ref 4.0–10.5)

## 2015-05-28 LAB — URINE MICROSCOPIC-ADD ON

## 2015-05-28 LAB — I-STAT CG4 LACTIC ACID, ED
Lactic Acid, Venous: 3.44 mmol/L (ref 0.5–2.0)
Lactic Acid, Venous: 1.43 mmol/L (ref 0.5–2.0)

## 2015-05-28 LAB — LIPASE, BLOOD: LIPASE: 17 U/L (ref 11–51)

## 2015-05-28 LAB — GLUCOSE, CAPILLARY: Glucose-Capillary: 127 mg/dL — ABNORMAL HIGH (ref 65–99)

## 2015-05-28 MED ORDER — ONDANSETRON HCL 4 MG/2ML IJ SOLN: 4.0000 mg | Freq: Four times a day (QID) | INTRAMUSCULAR | Status: DC | PRN

## 2015-05-28 MED ORDER — DEXTROSE 5 % IV SOLN
2.0000 g | Freq: Once | INTRAVENOUS | Status: DC
  Filled 2015-05-28: qty 2

## 2015-05-28 MED ORDER — NYSTATIN 100000 UNIT/GM EX POWD
Freq: Three times a day (TID) | CUTANEOUS | Status: DC
  Administered 2015-05-28 – 2015-06-01 (×11): via TOPICAL
  Filled 2015-05-28 (×2): qty 15

## 2015-05-28 MED ORDER — POTASSIUM CHLORIDE CRYS ER 20 MEQ PO TBCR
20.0000 meq | EXTENDED_RELEASE_TABLET | Freq: Every day | ORAL | Status: DC
Start: 2015-05-29 — End: 2015-06-01
  Administered 2015-05-29 – 2015-05-31 (×3): 20 meq via ORAL
  Filled 2015-05-28 (×5): qty 1

## 2015-05-28 MED ORDER — ACETAMINOPHEN 325 MG PO TABS: 650.0000 mg | ORAL_TABLET | Freq: Four times a day (QID) | ORAL | Status: DC | PRN

## 2015-05-28 MED ORDER — ACETAMINOPHEN 650 MG RE SUPP: 650.0000 mg | Freq: Four times a day (QID) | RECTAL | Status: DC | PRN

## 2015-05-28 MED ORDER — SODIUM CHLORIDE 0.9 % IV BOLUS (SEPSIS)
1000.0000 mL | Freq: Once | INTRAVENOUS | Status: AC
  Administered 2015-05-28: 1000 mL via INTRAVENOUS

## 2015-05-28 MED ORDER — DOCUSATE SODIUM 100 MG PO CAPS
100.0000 mg | ORAL_CAPSULE | Freq: Every day | ORAL | Status: DC | PRN
  Administered 2015-05-31: 100 mg via ORAL
  Filled 2015-05-28: qty 1

## 2015-05-28 MED ORDER — VITAMIN D 1000 UNITS PO TABS
2000.0000 [IU] | ORAL_TABLET | Freq: Every day | ORAL | Status: DC
  Administered 2015-05-29 – 2015-05-31 (×3): 2000 [IU] via ORAL
  Filled 2015-05-28 (×3): qty 2

## 2015-05-28 MED ORDER — INSULIN ASPART 100 UNIT/ML ~~LOC~~ SOLN: 0.0000 [IU] | Freq: Three times a day (TID) | SUBCUTANEOUS | Status: DC

## 2015-05-28 MED ORDER — HEPARIN SODIUM (PORCINE) 5000 UNIT/ML IJ SOLN
5000.0000 [IU] | Freq: Three times a day (TID) | INTRAMUSCULAR | Status: DC
  Administered 2015-05-28 – 2015-05-29 (×3): 5000 [IU] via SUBCUTANEOUS
  Filled 2015-05-28 (×3): qty 1

## 2015-05-28 MED ORDER — ASPIRIN EC 81 MG PO TBEC
81.0000 mg | DELAYED_RELEASE_TABLET | Freq: Every day | ORAL | Status: DC
  Administered 2015-05-29 – 2015-06-01 (×4): 81 mg via ORAL
  Filled 2015-05-28 (×4): qty 1

## 2015-05-28 MED ORDER — DEXTROSE 5 % IV SOLN
INTRAVENOUS | Status: AC
  Filled 2015-05-28: qty 10

## 2015-05-28 MED ORDER — SIMVASTATIN 40 MG PO TABS
40.0000 mg | ORAL_TABLET | Freq: Every day | ORAL | Status: DC
  Administered 2015-05-28 – 2015-05-31 (×4): 40 mg via ORAL
  Filled 2015-05-28 (×4): qty 1

## 2015-05-28 MED ORDER — ZOLPIDEM TARTRATE 5 MG PO TABS
5.0000 mg | ORAL_TABLET | Freq: Every day | ORAL | Status: DC
  Administered 2015-05-28 – 2015-05-29 (×2): 5 mg via ORAL
  Filled 2015-05-28 (×2): qty 1

## 2015-05-28 MED ORDER — NIFEDIPINE ER 60 MG PO TB24
60.0000 mg | ORAL_TABLET | Freq: Every day | ORAL | Status: DC
Start: 2015-05-29 — End: 2015-06-01
  Administered 2015-05-29 – 2015-06-01 (×4): 60 mg via ORAL
  Filled 2015-05-28 (×4): qty 1

## 2015-05-28 MED ORDER — DEXTROSE 5 % IV SOLN
1.0000 g | INTRAVENOUS | Status: DC
  Filled 2015-05-28: qty 10

## 2015-05-28 MED ORDER — SODIUM CHLORIDE 0.9 % IV SOLN
INTRAVENOUS | Status: DC
  Administered 2015-05-28 – 2015-05-29 (×3): via INTRAVENOUS

## 2015-05-28 MED ORDER — TRAMADOL HCL 50 MG PO TABS
100.0000 mg | ORAL_TABLET | Freq: Two times a day (BID) | ORAL | Status: DC | PRN
  Administered 2015-05-28 – 2015-05-29 (×2): 100 mg via ORAL
  Filled 2015-05-28 (×2): qty 2

## 2015-05-28 MED ORDER — ONDANSETRON HCL 4 MG PO TABS: 4.0000 mg | ORAL_TABLET | Freq: Four times a day (QID) | ORAL | Status: DC | PRN

## 2015-05-28 MED ORDER — ACETAMINOPHEN 500 MG PO TABS
1000.0000 mg | ORAL_TABLET | Freq: Once | ORAL | Status: AC
  Administered 2015-05-28: 1000 mg via ORAL
  Filled 2015-05-28: qty 2

## 2015-05-28 MED ORDER — ZOLPIDEM TARTRATE 5 MG PO TABS
5.0000 mg | ORAL_TABLET | Freq: Once | ORAL | Status: AC
  Administered 2015-05-29: 5 mg via ORAL
  Filled 2015-05-28: qty 1

## 2015-05-28 MED ORDER — TORSEMIDE 5 MG PO TABS
5.0000 mg | ORAL_TABLET | ORAL | Status: DC
  Administered 2015-05-29 – 2015-06-01 (×3): 5 mg via ORAL
  Filled 2015-05-28 (×3): qty 1

## 2015-05-28 MED ORDER — POLYETHYLENE GLYCOL 3350 17 G PO PACK
17.0000 g | PACK | Freq: Every evening | ORAL | Status: DC
  Administered 2015-05-29 – 2015-05-31 (×3): 17 g via ORAL
  Filled 2015-05-28 (×3): qty 1

## 2015-05-28 MED ORDER — SERTRALINE HCL 50 MG PO TABS
50.0000 mg | ORAL_TABLET | Freq: Every day | ORAL | Status: DC
  Administered 2015-05-29 – 2015-06-01 (×4): 50 mg via ORAL
  Filled 2015-05-28 (×4): qty 1

## 2015-05-28 MED ORDER — LATANOPROST 0.005 % OP SOLN
1.0000 [drp] | Freq: Every day | OPHTHALMIC | Status: DC
  Administered 2015-05-28 – 2015-05-31 (×4): 1 [drp] via OPHTHALMIC
  Filled 2015-05-28 (×2): qty 2.5

## 2015-05-28 MED ORDER — DEXTROSE 5 % IV SOLN
1.0000 g | INTRAVENOUS | Status: DC
  Administered 2015-05-28: 1 g via INTRAVENOUS

## 2015-05-28 MED ORDER — LORAZEPAM 0.5 MG PO TABS
0.5000 mg | ORAL_TABLET | Freq: Every day | ORAL | Status: DC
  Administered 2015-05-28 – 2015-05-29 (×2): 0.5 mg via ORAL
  Filled 2015-05-28 (×2): qty 1

## 2015-05-28 NOTE — Progress Notes (Signed)
Utilization review completed.  L. J. Josue Kass RN, BSN, CM 

## 2015-05-28 NOTE — ED Notes (Signed)
Daughter Massie Kluver called to let her know pt had a bed at [facility]. Daughter wants to talk with MD. Dr Maryan Rued on phone with MD.

## 2015-05-28 NOTE — ED Notes (Signed)
Report given to [facility] on [place].

## 2015-05-28 NOTE — ED Notes (Signed)
[facility] is aware of bed 1333 at [facility] for transporting.

## 2015-05-28 NOTE — ED Notes (Signed)
Two orders for  Rocephin a 1 gram and 2 gram. Clarified with Dr Maryan Rued what she wants given. Clarified that 1 gram needs to be given. She does not want 2 gram rocephin, 1 Gram given.

## 2015-05-28 NOTE — ED Notes (Signed)
C/o nausea and fever on Wednesday. Fever this am of 103.9.  Pt has hx of UTI. Some confusion this am.

## 2015-05-28 NOTE — ED Notes (Signed)
Left number with secretary on [place]. RN unable to take report at present.

## 2015-05-28 NOTE — Progress Notes (Signed)
Received called from Dr. Maryan Rued at [facility] to admit Jamie Brown due to early sepsis from UTI. Patient on presentation has RR of 28, temp of 103, lactic acid of 3.44 and dirty urine. WBC's 10.7. Patient is hemodynamically stable otherwise. She has PMH of squamous cell carcinoma (s/p radiation; last treatment in 2016), HTN, HLD and hx of recurrent UTI's; in fact just finished treatment for suspected UTI 2 weeks ago (she use Bactrim at that time). Patient accepted to Med-Surg bed. Fluid resuscitation as per sepsis protocol provided and started empirically on rocephin. Cultures taken and pending at this moment. Repeat lactic acid pending.  Barton Dubois [id number]

## 2015-05-28 NOTE — ED Provider Notes (Signed)
CSN: [id number]     Arrival date & time 05/28/15  [id number] History   First MD Initiated Contact with Patient 05/28/15 [phone]     No chief complaint on file.    (Consider location/radiation/quality/duration/timing/severity/associated sxs/prior Treatment) HPI Comments: Patient is an 82 year old female with a history of hypertension, high cholesterol and coronary artery disease with frequent UTIs presenting today with a fever. Patient states on Wednesday she started to have mild nausea and not feeling well which resolved as the day went on. Then yesterday in the morning patient had a low-grade temperature of 99 and states she was not feeling well but by the end of the day she had started to feel better and then this morning when she woke up she felt awful with a temperature of 102 and went to the clinic at her assisted living facility. Daughter reports that patient suffers from frequent urinary tract infections and as of last treated with Bactrim approximately 3 weeks ago for UTI. She took a full course, 7 days of Bactrim and repeat urine was clear. Patient denies any recent cough, congestion, cold, chest pain, shortness of breath, abdominal pain, vomiting or diarrhea. She denies any leg pain or swelling. No new areas of skin redness.  The history is provided by the patient and a relative.    Past Medical History  Diagnosis Date  . Hypertension   . High cholesterol   . Squamous cell carcinoma ([place])   . Radiation 11/19/14-12/30/14    upper right neck 5000 cGy  . Coronary artery disease    Past Surgical History  Procedure Laterality Date  . Cesarean section    . Back surgery     No family history on file. Social History  Substance Use Topics  . Smoking status: Never Smoker   . Smokeless tobacco: None  . Alcohol Use: No   OB History    No data available     Review of Systems  All other systems reviewed and are negative.     Allergies  Aldara; Codeine; Gabapentin; Isoptin; Remeron; and  Trazodone and nefazodone  Home Medications   Prior to Admission medications   Medication Sig Start Date End Date Taking? Authorizing Provider  aspirin 81 MG tablet Take 81 mg by mouth daily.   Yes Historical Provider, MD  CRANBERRY EXTRACT PO Take 25,000 mg by mouth.   Yes Historical Provider, MD  Fish Oil OIL 1 capsule by Does not apply route 1 day or 1 dose.    Yes Historical Provider, MD  LORazepam (ATIVAN) 0.5 MG tablet Take 0.5 mg by mouth 1 day or 1 dose. Nighlty   Yes Historical Provider, MD  NIFEdipine (PROCARDIA XL/ADALAT-CC) 60 MG 24 hr tablet Take 60 mg by mouth daily.   Yes Historical Provider, MD  POTASSIUM CHLORIDE ER PO Take by mouth.   Yes Historical Provider, MD  PRESCRIPTION MEDICATION Place 1 drop into both eyes once. Nightly   Yes Historical Provider, MD  Sertraline HCl (ZOLOFT PO) Take 50 mg by mouth.    Yes Historical Provider, MD  simvastatin (ZOCOR) 40 MG tablet Take 40 mg by mouth daily.   Yes Historical Provider, MD  TORSEMIDE PO Take 10 mg by mouth.    Yes Historical Provider, MD  traMADol (ULTRAM) 50 MG tablet Take 50 mg by mouth 2 (two) times daily as needed.   Yes Historical Provider, MD  UNABLE TO FIND 1 drop at bedtime. Med Name: Zioptin 1 drop each eye at bedtime  Yes Historical Provider, MD  zolpidem (AMBIEN) 5 MG tablet Take 15 mg by mouth at bedtime as needed for sleep.    Yes Historical Provider, MD   BP 125/56 mmHg  Pulse 86  Temp(Src) 99.8 F (37.7 C) (Oral)  Resp 20  Ht 5\' 1"  (1.549 m)  Wt 133 lb 3.2 oz (60.419 kg)  BMI 25.18 kg/m2  SpO2 93% Physical Exam  Constitutional: She is oriented to person, place, and time. She appears well-developed and well-nourished. No distress.  Hot to the touch  HENT:  Head: Normocephalic and atraumatic.  Mouth/Throat: Oropharynx is clear and moist.  Eyes: Conjunctivae and EOM are normal. Pupils are equal, round, and reactive to light.  Neck: Normal range of motion. Neck supple.  Cardiovascular: Normal rate,  regular rhythm and intact distal pulses.   Murmur heard.  Systolic murmur is present with a grade of 2/6  Pulmonary/Chest: Effort normal and breath sounds normal. No respiratory distress. She has no wheezes. She has no rales.    Abdominal: Soft. She exhibits no distension. There is no tenderness. There is no rebound and no guarding.  Musculoskeletal: Normal range of motion. She exhibits no edema or tenderness.  Neurological: She is alert and oriented to person, place, and time.  Skin: Skin is warm and dry. No rash noted. No erythema.  Psychiatric: She has a normal mood and affect. Her behavior is normal.  Nursing note and vitals reviewed.   ED Course  Procedures (including critical care time) Labs Review Labs Reviewed  COMPREHENSIVE METABOLIC PANEL - Abnormal; Notable for the following:    Sodium 133 (*)    Potassium 3.2 (*)    Chloride 99 (*)    Glucose, Bld 277 (*)    Calcium 8.7 (*)    Albumin 3.1 (*)    AST 42 (*)    GFR calc non Af Amer 58 (*)    All other components within normal limits  CBC WITH DIFFERENTIAL/PLATELET - Abnormal; Notable for the following:    WBC 10.7 (*)    Hemoglobin 11.5 (*)    HCT 34.7 (*)    Neutro Abs 9.7 (*)    Lymphs Abs 0.4 (*)    All other components within normal limits  URINALYSIS, ROUTINE W REFLEX MICROSCOPIC (NOT AT [facility]) - Abnormal; Notable for the following:    APPearance CLOUDY (*)    Glucose, UA 500 (*)    Hgb urine dipstick SMALL (*)    Leukocytes, UA LARGE (*)    All other components within normal limits  URINE MICROSCOPIC-ADD ON - Abnormal; Notable for the following:    Squamous Epithelial / LPF 0-5 (*)    Bacteria, UA FEW (*)    All other components within normal limits  I-STAT CG4 LACTIC ACID, ED - Abnormal; Notable for the following:    Lactic Acid, Venous 3.44 (*)    All other components within normal limits  CULTURE, BLOOD (ROUTINE X 2)  CULTURE, BLOOD (ROUTINE X 2)  URINE CULTURE  LIPASE, BLOOD    Imaging  Review Dg Chest 2 View  05/28/2015  CLINICAL DATA:  Fever. EXAM: CHEST  2 VIEW COMPARISON:  May 14, 2015. FINDINGS: The heart size and mediastinal contours are within normal limits. Both lungs are clear. The visualized skeletal structures are unremarkable. IMPRESSION: No active cardiopulmonary disease. Electronically Signed   By: Marijo Conception, M.D.   On: 05/28/2015 12:15   I have personally reviewed and evaluated these images and lab results as  part of my medical decision-making.   EKG Interpretation   Date/Time:  Friday May 28 2015 10:23:18 EDT Ventricular Rate:  102 PR Interval:  190 QRS Duration: 99 QT Interval:  365 QTC Calculation: 475 R Axis:   83 Text Interpretation:  Sinus tachycardia Ventricular premature complex  Borderline right axis deviation Low voltage, extremity and precordial  leads No significant change since last tracing Confirmed by Maryan Rued  MD,  Loree Fee ([id number]) on 05/28/2015 10:28:06 AM      MDM   Final diagnoses:  Sepsis, due to unspecified organism [facility])  UTI (lower urinary tract infection)    Patient is an 81 year old female presenting today as a code sepsis. Temperature is 102.8 without a clearly defined source but possible urinary tract infection. She has no abdominal tenderness concerning for cholecystitis, diverticulitis or pancreatitis. She has no respiratory or cardiac complaints concerning for potential pneumonia. She has no symptoms suggestive of endocarditis. She is currently hemodynamically stable and sepsis protocol was started. Patient received 2 L of fluid and Tylenol for her fever. Significant improvement in temperature in symptoms. On repeat evaluation she still has no areas of cellulitis or abdominal pain. Labs are consistent with mild cytosis of 10,000,  UA with large leukocytes in 6-30 white blood cells, lactic acid of 3.4 and CMP with blood sugar elevated at 277. No prior history of diabetes but daughter states she has been borderline.  Blood  and urine cultures ordered. Patient started on Rocephin for presumed urinary tract infection. She is not displaying any signs concerning for meningitis.  She will be admitted for further care.    Blanchie Dessert, MD 05/28/15 1234

## 2015-05-28 NOTE — ED Notes (Signed)
Pt aware that we have talked with daugther on phone and she is okay with pt going to [facility].

## 2015-05-28 NOTE — H&P (Addendum)
History and Physical    Jamie Brown [id number] DOB: [birth year] DOA: 05/28/2015  Referring Provider: Dr. Maryan Rued PCP: Javier Glazier, MD   Patient coming from: independent living facility   Chief Complaint: general malaise, fever  HPI: Jamie Brown is a 82 y.o. 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 Patient endorses some increase frequency as well. Symptoms present and worsening over the last 2 days or so. No CP, SOB, cough, nausea, vomiting, abd pain, HA's, hematochezia or any other complaints.  ED Course: patient with neg CXR. Sepsis features appreciated on exam (elevated lactic acid, mildly tachycardic; tachypneic and with temp of 103). initiated on sepsis protocol, cultures taken and empirically started on rocephin.  Review of Systems:  All other systems reviewed and apart from HPI, are negative.  Past Medical History  Diagnosis Date  . Hypertension   . High cholesterol   . Squamous cell carcinoma ([place])   . Radiation 11/19/14-12/30/14    upper right neck 5000 cGy  . Coronary artery disease     Past Surgical History  Procedure Laterality Date  . Cesarean section    . Back surgery       reports that she has never smoked. She has never used smokeless tobacco. She reports that she does not drink alcohol or use illicit drugs.  Allergies  Allergen Reactions  . Codeine Itching    Hives  . Gabapentin Other (See Comments)    Difficulty finding words and foggy feeling  . Isoptin [Verapamil] Itching  . Remeron [Mirtazapine] Other (See Comments)    Muscle cramping and pain  . Trazodone And Nefazodone Other (See Comments)    Mental confusion  . Aldara [Imiquimod] Rash    Redness     Family history: positive for HTN and cholesterol  Prior to Admission medications   Medication Sig  Start Date End Date Taking? Authorizing Provider  aspirin EC 81 MG tablet Take 81 mg by mouth daily.   Yes Historical Provider, MD  Cholecalciferol (VITAMIN D) 2000 units tablet Take 2,000 Units by mouth at bedtime.   Yes Historical Provider, MD  CRANBERRY EXTRACT PO Take 25,000 mg by mouth at bedtime.    Yes Historical Provider, MD  docusate sodium (STOOL SOFTENER) 100 MG capsule Take 100 mg by mouth daily as needed for mild constipation.   Yes Historical Provider, MD  glycerin, Pediatric, 1.2 g SUPP Place 1 suppository rectally daily as needed for moderate constipation.   Yes Historical Provider, MD  ibuprofen (ADVIL,MOTRIN) 200 MG tablet Take 600 mg by mouth every 6 (six) hours as needed for fever, headache, mild pain, moderate pain or cramping.   Yes Historical Provider, MD  LORazepam (ATIVAN) 0.5 MG tablet Take 0.5 mg by mouth at bedtime. Usually takes between 12am and 2pm when she gets up to use restroom   Yes Historical Provider, MD  NIFEdipine (PROCARDIA XL/ADALAT-CC) 60 MG 24 hr tablet Take 60 mg by mouth daily.   Yes Historical Provider, MD  Omega-3 Fatty Acids (FISH OIL) 1200 MG CAPS Take 1,200 mg by mouth at bedtime.   Yes Historical Provider, MD  polyethylene glycol (MIRALAX / GLYCOLAX) packet Take 17 g by mouth every evening.   Yes Historical Provider, MD  potassium chloride (K-DUR,KLOR-CON) 10 MEQ tablet Take 10 mEq by mouth daily with breakfast.  Yes Historical Provider, MD  psyllium (METAMUCIL) 58.6 % powder Take 1 packet by mouth daily with breakfast.   Yes Historical Provider, MD  sertraline (ZOLOFT) 50 MG tablet Take 50 mg by mouth daily with breakfast.   Yes Historical Provider, MD  simvastatin (ZOCOR) 40 MG tablet Take 40 mg by mouth at bedtime.    Yes Historical Provider, MD  Tafluprost (ZIOPTAN) 0.0015 % SOLN Place 1 drop into both eyes at bedtime.   Yes Historical Provider, MD  torsemide (DEMADEX) 10 MG tablet Take 5 mg by mouth See admin instructions. Takes on Tuesday,  Thursday, Saturday, and Sunday   Yes Historical Provider, MD  traMADol (ULTRAM) 50 MG tablet Take 100 mg by mouth 2 (two) times daily.    Yes Historical Provider, MD  traMADol (ULTRAM) 50 MG tablet Take 100 mg by mouth at bedtime as needed (for pain).   Yes Historical Provider, MD  zolpidem (AMBIEN) 10 MG tablet Take 10 mg by mouth at bedtime.   Yes Historical Provider, MD    Physical Exam: Filed Vitals:   05/28/15 1230 05/28/15 1300 05/28/15 1338 05/28/15 1433  BP: 126/52 126/55 114/46 129/53  Pulse: 84 87 91 86  Temp:   98.4 F (36.9 C) 98.3 F (36.8 C)  TempSrc:   Oral Oral  Resp: 20 17 20 16   Height:    5\' 1"  (1.549 m)  Weight:    60.413 kg (133 lb 3 oz)  SpO2: 94% 94% 94% 94%    Constitutional: NAD, calm, comfortable. Slightly warm to touch and without acute complaints. Eyes: PERTLA, lids and conjunctivae normal, no icterus or nystagmus  ENMT: Mucous membranes are moist. Posterior pharynx clear of any exudate or lesions. No Thrush in her mouth. Patient hard of hearing Neck: normal, supple, no masses, no thyromegaly, no JVD Respiratory: clear to auscultation bilaterally, no wheezing, no crackles. Normal respiratory effort. No accessory muscle use.  Cardiovascular: S1 & S2 heard, regular rate and rhythm, no rubs or gallops. Trace Lower extremity edema bilaterally. 2+ pedal pulses. No carotid bruits. Positive SEM Abdomen: No distension, no tenderness, no masses palpated. No hepatosplenomegaly. Bowel sounds normal.  Musculoskeletal: no clubbing / cyanosis. No joint deformity upper and lower extremities. Good ROM, no contractures. Normal muscle tone.  Skin: positive candidiasis under her breast bilaterally, no open lesions, ulcers or petechiaes Neurologic: CN 2-12 grossly intact. Sensation intact, DTR normal. Strength 4/5 in all 4 limbs.  Psychiatric: Normal judgment and insight. Alert and oriented x 3. Normal mood.   Labs on Admission: I have personally reviewed following labs and  imaging studies  CBC:  Recent Labs Lab 05/28/15 1010  WBC 10.7*  NEUTROABS 9.7*  HGB 11.5*  HCT 34.7*  MCV 83.6  PLT [id number]   Basic Metabolic Panel:  Recent Labs Lab 05/28/15 1010  NA 133*  K 3.2*  CL 99*  CO2 24  GLUCOSE 277*  BUN 19  CREATININE 0.87  CALCIUM 8.7*   GFR: Estimated Creatinine Clearance: 37.3 mL/min (by C-G formula based on Cr of 0.87).   Liver Function Tests:  Recent Labs Lab 05/28/15 1010  AST 42*  ALT 39  ALKPHOS 88  BILITOT 0.4  PROT 6.9  ALBUMIN 3.1*    Recent Labs Lab 05/28/15 1010  LIPASE 17   Urine analysis:    Component Value Date/Time   COLORURINE YELLOW 05/28/2015 1110   APPEARANCEUR CLOUDY* 05/28/2015 1110   LABSPEC 1.010 05/28/2015 1110   PHURINE 6.5 05/28/2015 1110   GLUCOSEU 500*  05/28/2015 1110   HGBUR SMALL* 05/28/2015 [place] 05/28/2015 [place] 05/28/2015 1110   PROTEINUR NEGATIVE 05/28/2015 1110   UROBILINOGEN 0.2 08/23/2006 2210   NITRITE NEGATIVE 05/28/2015 1110   LEUKOCYTESUR LARGE* 05/28/2015 1110   Radiological Exams on Admission: Dg Chest 2 View  05/28/2015  CLINICAL DATA:  Fever. EXAM: CHEST  2 VIEW COMPARISON:  May 14, 2015. FINDINGS: The heart size and mediastinal contours are within normal limits. Both lungs are clear. The visualized skeletal structures are unremarkable. IMPRESSION: No active cardiopulmonary disease. Electronically Signed   By: Marijo Conception, M.D.   On: 05/28/2015 12:15    EKG:  Sinus tachycardia; not acute ischemic changes.  Assessment/Plan 1-Sepsis ([place]): due to UTI. On admission had tachypnea, temp of 103; HR 101; RR 29 and lactic acid of 3.44. Source is her urine. -sepsis protocol initiated while in ED -continue empiric rocephin -follow culture data -after IVF's resuscitation patient hemodynamically stable and with normal lactic acid.  -will continue supportive care and follow clinical response   2-Squamous cell carcinoma of skin of  neck: -has completed radiation therapy in 2016 -per family members info in remission   3-UTI (lower urinary tract infection): -will follow culture data -continue IVF's -follow response  4-HLD (hyperlipidemia): -continue statins  5-HTN (hypertension): stable and well controlled -will continue current antihypertensive strategy and heart healthy diet  6-Lactic acidosis: -Due to UTI/sepsis -patient lactic acid improved with IVF's -will follow trend  7-skin fungal infection -will treat with nystatin powder   8-depression/anxiety/Insomnia: -continue Zoloft, QHS Ambien and PRN ativan  9-hyperglycemia: -will check A1C -will follow CBG's -SSI -CBG's 277 on admission; no prior hx of diabetes   DVT prophylaxis: heparin  Code Status: Full Family Communication: daughter at bedside   Disposition Plan: most likely back to independent living; but will need to be determine base on patient progression. Consults called: none  Admission status: inpatient, med-surg; LOS > 2 midnights     Barton Dubois MD [facility] Pager [phone]  If 7PM-7AM, please contact night-coverage [email or web address] Password [email or web address]  05/28/2015, 6:00 PM

## 2015-05-28 NOTE — ED Notes (Signed)
Report given to Sonia Side on [facility]. Their ETA is 15-20 min.

## 2015-05-29 DIAGNOSIS — N390 Urinary tract infection, site not specified: Secondary | ICD-10-CM

## 2015-05-29 DIAGNOSIS — R7881 Bacteremia: Secondary | ICD-10-CM

## 2015-05-29 DIAGNOSIS — E872 Acidosis: Secondary | ICD-10-CM

## 2015-05-29 DIAGNOSIS — A419 Sepsis, unspecified organism: Secondary | ICD-10-CM

## 2015-05-29 LAB — BLOOD CULTURE ID PANEL (REFLEXED)
ACINETOBACTER BAUMANNII: NOT DETECTED
CANDIDA TROPICALIS: NOT DETECTED
Candida albicans: NOT DETECTED
Candida glabrata: NOT DETECTED
Candida krusei: NOT DETECTED
Candida parapsilosis: NOT DETECTED
Carbapenem resistance: NOT DETECTED
ENTEROCOCCUS SPECIES: NOT DETECTED
ESCHERICHIA COLI: DETECTED — AB
Enterobacter cloacae complex: NOT DETECTED
Enterobacteriaceae species: NOT DETECTED
HAEMOPHILUS INFLUENZAE: NOT DETECTED
Klebsiella oxytoca: NOT DETECTED
Klebsiella pneumoniae: NOT DETECTED
LISTERIA MONOCYTOGENES: NOT DETECTED
METHICILLIN RESISTANCE: NOT DETECTED
NEISSERIA MENINGITIDIS: NOT DETECTED
PROTEUS SPECIES: NOT DETECTED
Pseudomonas aeruginosa: NOT DETECTED
SERRATIA MARCESCENS: NOT DETECTED
STAPHYLOCOCCUS SPECIES: NOT DETECTED
STREPTOCOCCUS PYOGENES: NOT DETECTED
STREPTOCOCCUS SPECIES: NOT DETECTED
Staphylococcus aureus (BCID): NOT DETECTED
Streptococcus agalactiae: NOT DETECTED
Streptococcus pneumoniae: NOT DETECTED
VANCOMYCIN RESISTANCE: NOT DETECTED

## 2015-05-29 LAB — BASIC METABOLIC PANEL
Anion gap: 8 (ref 5–15)
BUN: 10 mg/dL (ref 6–20)
CHLORIDE: 107 mmol/L (ref 101–111)
CO2: 25 mmol/L (ref 22–32)
Calcium: 8.5 mg/dL — ABNORMAL LOW (ref 8.9–10.3)
Creatinine, Ser: 0.77 mg/dL (ref 0.44–1.00)
GFR calc Af Amer: >60 mL/min (ref >60)
GFR calc non Af Amer: >60 mL/min (ref >60)
Glucose, Bld: 111 mg/dL — ABNORMAL HIGH (ref 65–99)
POTASSIUM: 3 mmol/L — AB (ref 3.5–5.1)
SODIUM: 140 mmol/L (ref 135–145)

## 2015-05-29 LAB — GLUCOSE, CAPILLARY
GLUCOSE-CAPILLARY: 97 mg/dL (ref 65–99)
GLUCOSE-CAPILLARY: 119 mg/dL — AB (ref 65–99)
Glucose-Capillary: 114 mg/dL — ABNORMAL HIGH (ref 65–99)
Glucose-Capillary: 106 mg/dL — ABNORMAL HIGH (ref 65–99)

## 2015-05-29 LAB — CBC
HEMATOCRIT: 31.9 % — AB (ref 36.0–46.0)
Hemoglobin: 10.5 g/dL — ABNORMAL LOW (ref 12.0–15.0)
MCH: 27.6 pg (ref 26.0–34.0)
MCHC: 32.9 g/dL (ref 30.0–36.0)
MCV: 83.7 fL (ref 78.0–100.0)
PLATELETS: 291 10*3/uL (ref 150–400)
RBC: 3.81 MIL/uL — ABNORMAL LOW (ref 3.87–5.11)
RDW: 15.5 % (ref 11.5–15.5)
WBC: 10.1 10*3/uL (ref 4.0–10.5)

## 2015-05-29 LAB — URINE CULTURE: Culture: 4000 — AB

## 2015-05-29 MED ORDER — DEXTROSE 5 % IV SOLN
2.0000 g | INTRAVENOUS | Status: DC
  Administered 2015-05-29 – 2015-05-31 (×3): 2 g via INTRAVENOUS
  Filled 2015-05-29 (×5): qty 2

## 2015-05-29 MED ORDER — ZOLPIDEM TARTRATE 5 MG PO TABS
5.0000 mg | ORAL_TABLET | Freq: Once | ORAL | Status: AC
  Administered 2015-05-29: 5 mg via ORAL
  Filled 2015-05-29: qty 1

## 2015-05-29 MED ORDER — ENOXAPARIN SODIUM 40 MG/0.4ML ~~LOC~~ SOLN
40.0000 mg | SUBCUTANEOUS | Status: DC
  Administered 2015-05-29 – 2015-05-31 (×3): 40 mg via SUBCUTANEOUS
  Filled 2015-05-29 (×3): qty 0.4

## 2015-05-29 MED ORDER — TRAMADOL HCL 50 MG PO TABS
100.0000 mg | ORAL_TABLET | Freq: Four times a day (QID) | ORAL | Status: DC | PRN
  Administered 2015-05-29 – 2015-06-01 (×6): 100 mg via ORAL
  Filled 2015-05-29 (×6): qty 2

## 2015-05-29 MED ORDER — POTASSIUM CHLORIDE CRYS ER 20 MEQ PO TBCR
40.0000 meq | EXTENDED_RELEASE_TABLET | ORAL | Status: AC
  Administered 2015-05-29 (×2): 40 meq via ORAL
  Filled 2015-05-29 (×2): qty 2

## 2015-05-29 NOTE — Progress Notes (Signed)
PROGRESS NOTE  Jamie Brown [id number] DOB: Dec 02, 1924 DOA: 05/28/2015 PCP: Javier Glazier, MD  HPI/Recap of past 24 hours:  Feeling better Reported chronic back pain, need prn ultram Family in room  Assessment/Plan: Principal Problem:   Sepsis ([place]) Active Problems:   Squamous cell carcinoma of skin of neck   UTI (lower urinary tract infection)   HLD (hyperlipidemia)   HTN (hypertension)   Lactic acidosis   Insomnia  1-Sepsis ([place]): due to UTI/ecoli bacteremia . On admission had tachypnea, temp of 103; HR 101; RR 29 and lactic acid of 3.44. Source is her urine. -sepsis protocol initiated while in ED -continue empiric rocephin -follow culture data -after IVF's resuscitation patient hemodynamically stable and with normal lactic acid.  -need to repeat blood culture to ensure clearnance  2-Squamous cell carcinoma of skin of neck: -has completed radiation therapy in 2016 -per family members info in remission   3-UTI (lower urinary tract infection): -will follow culture data -continue IVF's/abx  4-HLD (hyperlipidemia): -continue statins  5-HTN (hypertension): stable and well controlled -will continue current antihypertensive strategy and heart healthy diet  6-Lactic acidosis: -Due to UTI/sepsis -patient lactic acid improved with IVF's -will follow trend  7-skin fungal infection -will treat with nystatin powder   8-depression/anxiety/Insomnia: -continue Zoloft, QHS Ambien and PRN ativan  9-hyperglycemia: -will check A1C -SSI -CBG's 277 on admission; no prior hx of diabetes   DVT prophylaxis: lovenox Code Status: Full Family Communication: daughter at bedside  Disposition Plan: most likely back to independent living; but will need to be determine base on patient progression. Consults called: none    Disposition Plan: she is from assisted living, may need SNF   Procedures:  none  Antibiotics:  rocephin   Objective: BP 135/49 mmHg   Pulse 80  Temp(Src) 97.8 F (36.6 C) (Oral)  Resp 16  Ht 5\' 1"  (1.549 m)  Wt 60.413 kg (133 lb 3 oz)  BMI 25.18 kg/m2  SpO2 95%  Intake/Output Summary (Last 24 hours) at 05/29/15 1635 Last data filed at 05/29/15 1250  Gross per 24 hour  Intake 1462.5 ml  Output    500 ml  Net  962.5 ml   Filed Weights   05/28/15 0952 05/28/15 1030 05/28/15 1433  Weight: 63.504 kg (140 lb) 60.419 kg (133 lb 3.2 oz) 60.413 kg (133 lb 3 oz)    Exam:   General:  NAD  Cardiovascular: RRR  Respiratory: CTABL  Abdomen: Soft/ND/NT, positive BS  Musculoskeletal: No Edema  Neuro: aaox3  Data Reviewed: Basic Metabolic Panel:  Recent Labs Lab 05/28/15 1010 05/28/15 2043 05/29/15 0347  NA 133*  --  140  K 3.2*  --  3.0*  CL 99*  --  107  CO2 24  --  25  GLUCOSE 277*  --  111*  BUN 19  --  10  CREATININE 0.87 0.84 0.77  CALCIUM 8.7*  --  8.5*   Liver Function Tests:  Recent Labs Lab 05/28/15 1010  AST 42*  ALT 39  ALKPHOS 88  BILITOT 0.4  PROT 6.9  ALBUMIN 3.1*    Recent Labs Lab 05/28/15 1010  LIPASE 17   No results for input(s): AMMONIA in the last 168 hours. CBC:  Recent Labs Lab 05/28/15 1010 05/28/15 2043 05/29/15 0347  WBC 10.7* 11.5* 10.1  NEUTROABS 9.7*  --   --   HGB 11.5* 10.7* 10.5*  HCT 34.7* 32.1* 31.9*  MCV 83.6 82.9 83.7  PLT 295 286 291  Cardiac Enzymes:   No results for input(s): CKTOTAL, CKMB, CKMBINDEX, TROPONINI in the last 168 hours. BNP (last 3 results) No results for input(s): BNP in the last 8760 hours.  ProBNP (last 3 results) No results for input(s): PROBNP in the last 8760 hours.  CBG:  Recent Labs Lab 05/28/15 2148 05/29/15 0728 05/29/15 1132  GLUCAP 127* 114* 97    Recent Results (from the past 240 hour(s))  Blood Culture (routine x 2)     Status: None (Preliminary result)   Collection Time: 05/28/15 10:10 AM  Result Value Ref Range Status   Specimen Description BLOOD RT ARM  Final   Special Requests BOTTLES  DRAWN AEROBIC AND ANAEROBIC  5CC  Final   Culture  Setup Time   Final    GRAM NEGATIVE RODS IN BOTH AEROBIC AND ANAEROBIC BOTTLES Organism ID to follow CRITICAL RESULT CALLED TO, READ BACK BY AND VERIFIED WITH: Rhae Lerner AT [id number] 05/29/15 BY L BENFIELD    Culture   Final    GRAM NEGATIVE RODS CULTURE REINCUBATED FOR BETTER GROWTH Performed at [facility]    Report Status PENDING  Incomplete  Blood Culture ID Panel (Reflexed)     Status: Abnormal   Collection Time: 05/28/15 10:10 AM  Result Value Ref Range Status   Enterococcus species NOT DETECTED NOT DETECTED Final   Vancomycin resistance NOT DETECTED NOT DETECTED Final   Listeria monocytogenes NOT DETECTED NOT DETECTED Final   Staphylococcus species NOT DETECTED NOT DETECTED Final   Staphylococcus aureus NOT DETECTED NOT DETECTED Final   Methicillin resistance NOT DETECTED NOT DETECTED Final   Streptococcus species NOT DETECTED NOT DETECTED Final   Streptococcus agalactiae NOT DETECTED NOT DETECTED Final   Streptococcus pneumoniae NOT DETECTED NOT DETECTED Final   Streptococcus pyogenes NOT DETECTED NOT DETECTED Final   Acinetobacter baumannii NOT DETECTED NOT DETECTED Final   Enterobacteriaceae species NOT DETECTED NOT DETECTED Final   Enterobacter cloacae complex NOT DETECTED NOT DETECTED Final   Escherichia coli DETECTED (A) NOT DETECTED Final    Comment: CRITICAL RESULT CALLED TO, READ BACK BY AND VERIFIED WITH: Ilda Basset D AT [id number] 05/29/15 BY L BENFIELD    Klebsiella oxytoca NOT DETECTED NOT DETECTED Final   Klebsiella pneumoniae NOT DETECTED NOT DETECTED Final   Proteus species NOT DETECTED NOT DETECTED Final   Serratia marcescens NOT DETECTED NOT DETECTED Final   Carbapenem resistance NOT DETECTED NOT DETECTED Final   Haemophilus influenzae NOT DETECTED NOT DETECTED Final   Neisseria meningitidis NOT DETECTED NOT DETECTED Final   Pseudomonas aeruginosa NOT DETECTED NOT DETECTED Final   Candida albicans  NOT DETECTED NOT DETECTED Final   Candida glabrata NOT DETECTED NOT DETECTED Final   Candida krusei NOT DETECTED NOT DETECTED Final   Candida parapsilosis NOT DETECTED NOT DETECTED Final   Candida tropicalis NOT DETECTED NOT DETECTED Final    Comment: Performed at [facility]  Blood Culture (routine x 2)     Status: None (Preliminary result)   Collection Time: 05/28/15 10:15 AM  Result Value Ref Range Status   Specimen Description BLOOD  LEFT ARM  Final   Special Requests BOTTLES DRAWN AEROBIC AND ANAEROBIC  5CC  Final   Culture  Setup Time   Final    GRAM NEGATIVE RODS IN BOTH AEROBIC AND ANAEROBIC BOTTLES CRITICAL RESULT CALLED TO, READ BACK BY AND VERIFIED WITH: Melodye Ped, PHARM D AT [place] ON [id number] BY Rhea Bleacher    Culture   Final  GRAM NEGATIVE RODS CULTURE REINCUBATED FOR BETTER GROWTH Performed at [facility]    Report Status PENDING  Incomplete  Urine culture     Status: Abnormal   Collection Time: 05/28/15 11:10 AM  Result Value Ref Range Status   Specimen Description URINE, CATHETERIZED  Final   Special Requests NONE  Final   Culture (A)  Final    4,000 COLONIES/mL INSIGNIFICANT GROWTH Performed at [facility]    Report Status 05/29/2015 FINAL  Final     Studies: No results found.  Scheduled Meds: . aspirin EC  81 mg Oral Daily  . cefTRIAXone (ROCEPHIN)  IV  2 g Intravenous Q24H  . cholecalciferol  2,000 Units Oral QHS  . enoxaparin (LOVENOX) injection  40 mg Subcutaneous Q24H  . insulin aspart  0-9 Units Subcutaneous TID WC  . latanoprost  1 drop Both Eyes QHS  . LORazepam  0.5 mg Oral QHS  . NIFEdipine  60 mg Oral Daily  . nystatin   Topical TID  . polyethylene glycol  17 g Oral QPM  . potassium chloride  20 mEq Oral Q breakfast  . potassium chloride  40 mEq Oral Q4H  . sertraline  50 mg Oral Q breakfast  . simvastatin  40 mg Oral QHS  . torsemide  5 mg Oral Once per day on Sun Tue Thu Sat  . zolpidem  5 mg Oral QHS     Continuous Infusions: . sodium chloride 75 mL/hr at 05/29/15 1228     Time spent: 57mins  Cassandra Harbold MD, PhD  [facility] Pager [phone]. If 7PM-7AM, please contact night-coverage at [email or web address], password [facility] 05/29/2015, 4:35 PM  LOS: 1 day

## 2015-05-29 NOTE — Progress Notes (Signed)
Received subsequent call at 0825 that 2 of 2 blood cultures drawn on 05/28/15 at 1015 now growing gram negative rods. Per Micro lab, they do not run BCID on more than one set of blood cultures if GS showing same. GS showing gram negative rods on 05/28/15 1010 and 05/28/15 1015 blood cultures, so BCID not run on this set (1015 set). Notified Dr. Erlinda Hong of results. See below note from Ralene Bathe, PharmD, for adjustment already made to therapy. Antibiotics adjusted to Ceftriaxone 2g IV q24h per d/w Dr. Erlinda Hong.   Lindell Spar, PharmD, BCPS Pager: [phone] 05/29/2015 08:55 AM

## 2015-05-29 NOTE — Progress Notes (Signed)
CSW received consult that patient admitted from [facility]. CSW called [facility] (ph#: [phone]) to confirm that she is from [place].   If patient will need higher level of care, please order PT evaluation for disposition recommendation.   No further CSW needs identified at this time - CSW signing off.   Raynaldo Opitz, [facility] Clinical Social Worker cell #: [phone]

## 2015-05-29 NOTE — Progress Notes (Signed)
PHARMACY - PHYSICIAN COMMUNICATION CRITICAL VALUE ALERT - BLOOD CULTURE IDENTIFICATION (BCID)  BCID result received:  [ ]  Vancomycin resistant enterococcus (VRE) [ ]  Enterococcus spp (no resistance detected) [ ]  Listeria monocytogenes [ ]  Staphylococcus species (methicillin resistance detected) [ ]  Staphylococcus species (methicllin resistance NOT detected) [ ]  Methicillin-resistant Staphylococcus aureus (MRSA) [ ]  Methicillin-susceptible Staphylococcus aureus (MSSA) [ ]  Streptococcus agalactiae (Group B strep) [ ]  Streptococcus pneumoniae [ ]  Streptococcus pyogenes (Group A strep) [ ]  Streptococcus spp.  [ ]  Acinetobacter baumannii [ ]  Enterobacter cloacae [ ]  Enterobacter cloacae (Carbapenem resistance detected) Valu.Nieves ] Escherichia coli [ ]  Escherichia coli (Carbapenem resistance detected) [ ]  Haemophilus influenza [ ]  Klebsiella oxytoca [ ]  Klebsiella oxytoca (Carbapenem resistance detected) [ ]  Klebsiella pneumoniae [ ]  Klebsiella pneumoniae (Carbapenem resistance detected) [ ]  Neisseria meningitidis [ ]  Proteus spp. [ ]  Proteus spp. (Carbapenem resistance detected) [ ]  Pseudomonas aeruginosa [ ]  Pseudomonas aeruginosa (Carbapenem resistance detected) [ ]  Serratia marcescens [ ]  Candida _______ albicans, glabrata, krusei, parapsilosis, tropicalis (fill in blank with correct species)  Name of physician (or Provider) Contacted: Dr. Erlinda Hong  Changes to prescribed antibiotics required: Increase to Ceftriaxone 2g q24h  Ralene Bathe, PharmD, BCPS 05/29/2015, 8:05 AM  Pager: [phone]

## 2015-05-30 DIAGNOSIS — G4700 Insomnia, unspecified: Secondary | ICD-10-CM

## 2015-05-30 DIAGNOSIS — I10 Essential (primary) hypertension: Secondary | ICD-10-CM

## 2015-05-30 LAB — GLUCOSE, CAPILLARY
GLUCOSE-CAPILLARY: 303 mg/dL — AB (ref 65–99)
GLUCOSE-CAPILLARY: 112 mg/dL — AB (ref 65–99)
GLUCOSE-CAPILLARY: 91 mg/dL (ref 65–99)
Glucose-Capillary: 144 mg/dL — ABNORMAL HIGH (ref 65–99)

## 2015-05-30 LAB — HEPATIC FUNCTION PANEL
ALT: 34 U/L (ref 14–54)
AST: 27 U/L (ref 15–41)
Albumin: 2.9 g/dL — ABNORMAL LOW (ref 3.5–5.0)
Alkaline Phosphatase: 79 U/L (ref 38–126)
Bilirubin, Direct: <0.1 mg/dL — ABNORMAL LOW (ref 0.1–0.5)
TOTAL PROTEIN: 6.6 g/dL (ref 6.5–8.1)
Total Bilirubin: 0.5 mg/dL (ref 0.3–1.2)

## 2015-05-30 LAB — CBC
HCT: 34.2 % — ABNORMAL LOW (ref 36.0–46.0)
HEMOGLOBIN: 11.2 g/dL — AB (ref 12.0–15.0)
MCH: 27.2 pg (ref 26.0–34.0)
MCHC: 32.7 g/dL (ref 30.0–36.0)
MCV: 83 fL (ref 78.0–100.0)
Platelets: 315 10*3/uL (ref 150–400)
RBC: 4.12 MIL/uL (ref 3.87–5.11)
RDW: 15.4 % (ref 11.5–15.5)
WBC: 7.6 10*3/uL (ref 4.0–10.5)

## 2015-05-30 LAB — BASIC METABOLIC PANEL
Anion gap: 8 (ref 5–15)
BUN: 7 mg/dL (ref 6–20)
CHLORIDE: 108 mmol/L (ref 101–111)
CO2: 26 mmol/L (ref 22–32)
CREATININE: 0.7 mg/dL (ref 0.44–1.00)
Calcium: 8.8 mg/dL — ABNORMAL LOW (ref 8.9–10.3)
GFR calc Af Amer: >60 mL/min (ref >60)
Glucose, Bld: 111 mg/dL — ABNORMAL HIGH (ref 65–99)
POTASSIUM: 3.8 mmol/L (ref 3.5–5.1)
SODIUM: 142 mmol/L (ref 135–145)

## 2015-05-30 LAB — MAGNESIUM: MAGNESIUM: 1.9 mg/dL (ref 1.7–2.4)

## 2015-05-30 MED ORDER — SENNOSIDES-DOCUSATE SODIUM 8.6-50 MG PO TABS
1.0000 | ORAL_TABLET | Freq: Two times a day (BID) | ORAL | Status: AC
  Administered 2015-05-30 (×2): 1 via ORAL
  Filled 2015-05-30 (×2): qty 1

## 2015-05-30 MED ORDER — LORAZEPAM 0.5 MG PO TABS
0.5000 mg | ORAL_TABLET | Freq: Every day | ORAL | Status: DC
  Administered 2015-05-31: 0.5 mg via ORAL
  Filled 2015-05-30: qty 1

## 2015-05-30 MED ORDER — ZOLPIDEM TARTRATE 10 MG PO TABS
10.0000 mg | ORAL_TABLET | Freq: Every day | ORAL | Status: DC
  Administered 2015-05-30 – 2015-05-31 (×2): 10 mg via ORAL
  Filled 2015-05-30 (×2): qty 1

## 2015-05-30 NOTE — Evaluation (Addendum)
Physical Therapy Evaluation Patient Details Name: SHABANA MOSCHELLA MRN: [id number] DOB: 1924/09/22 Today's Date: 05/30/2015   History of Present Illness  82 y.o. 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 Dx of UTI, sepsis.     Clinical Impression  Pt is independent with mobility using her rollator. No further PT indicated. Encouraged pt to ambulate in halls 2-3x/day with family assisting with pushing IV pole. PT signing off.     Follow Up Recommendations No PT follow up    Equipment Recommendations  None recommended by PT    Recommendations for Other Services       Precautions / Restrictions Precautions Precautions: Fall Precaution Comments: 1 fall in past 1 year in November 2016 Restrictions Weight Bearing Restrictions: No      Mobility  Bed Mobility Overal bed mobility: Independent                Transfers Overall transfer level: Modified independent Equipment used: Rolling walker (2 wheeled)                Ambulation/Gait Ambulation/Gait assistance: Modified independent (Device/Increase time) Ambulation Distance (Feet): 30 Feet Assistive device: Rolling walker (2 wheeled) Gait Pattern/deviations: WFL(Within Functional Limits)   Gait velocity interpretation: at or above normal speed for age/gender General Gait Details: steady with RW, no LOB, pt did long walk in hall this morning with nursing without LOB  Stairs            Wheelchair Mobility    Modified Rankin (Stroke Patients Only)       Balance Overall balance assessment: Modified Independent                                           Pertinent Vitals/Pain Pain Assessment: No/denies pain    Home Living Family/patient expects to be discharged to:: Private residence Living Arrangements:  Alone Available Help at Discharge: Available PRN/intermittently Type of Home: Independent living facility       Home Layout: One level Home Equipment: Environmental consultant - 4 wheels      Prior Function Level of Independence: Independent with assistive device(s)         Comments: uses rollator     Hand Dominance        Extremity/Trunk Assessment   Upper Extremity Assessment: Overall WFL for tasks assessed           Lower Extremity Assessment: Overall WFL for tasks assessed      Cervical / Trunk Assessment: Normal  Communication   Communication: No difficulties  Cognition Arousal/Alertness: Awake/alert Behavior During Therapy: WFL for tasks assessed/performed Overall Cognitive Status: Within Functional Limits for tasks assessed                      General Comments      Exercises        Assessment/Plan    PT Assessment Patent does not need any further PT services  PT Diagnosis     PT Problem List    PT Treatment Interventions     PT Goals (Current goals can be found in the Care Plan section) Acute Rehab PT Goals Patient Stated Goal: return to ILF PT Goal Formulation: All assessment and education complete,  DC therapy    Frequency     Barriers to discharge        Co-evaluation               End of Session   Activity Tolerance: Patient tolerated treatment well Patient left: in bed;with call bell/phone within reach;with family/visitor present Nurse Communication: Mobility status         Time: [id number] PT Time Calculation (min) (ACUTE ONLY): 15 min   Charges:   PT Evaluation $PT Eval Low Complexity: 1 Procedure     PT G CodesPhilomena Doheny 05/30/2015, 2:23 PM [phone]

## 2015-05-30 NOTE — Progress Notes (Signed)
PROGRESS NOTE  Jamie Brown [id number] DOB: 1924/03/10 DOA: 05/28/2015 PCP: Javier Glazier, MD  HPI/Recap of past 24 hours:  Feeling better, no fever, want to eat regular diet, want ambien and ativan which she has been on for years at specific time Family in room  Assessment/Plan: Principal Problem:   Sepsis ([place]) Active Problems:   Squamous cell carcinoma of skin of neck   UTI (lower urinary tract infection)   HLD (hyperlipidemia)   HTN (hypertension)   Lactic acidosis   Insomnia  1-Sepsis ([place]): due to UTI/ecoli bacteremia . On admission had tachypnea, temp of 103; HR 101; RR 29 and lactic acid of 3.44. Source is her urine. -sepsis protocol initiated while in ED -continue empiric rocephin -after IVF's resuscitation patient hemodynamically stable and with normal lactic acid. d/c ivf - repeat blood culture to ensure clearnance  2-Squamous cell carcinoma of skin of neck: -has completed radiation therapy in 2016 -per family members info in remission   3-UTI (lower urinary tract infection): -insignificant growth -off ivf/ cotninue abx  4-HLD (hyperlipidemia): -continue statins  5-HTN (hypertension): stable and well controlled -will continue current antihypertensive strategy. Patient want regular diet  6-Lactic acidosis: -Due to UTI/sepsis -normalized.  7-skin fungal infection -will treat with nystatin powder   8-depression/anxiety/Insomnia: -continue Zoloft, QHS Ambien and PRN ativan  9-hyperglycemia: - A1C pending -SSI -CBG's 277 on admission; no prior hx of diabetes   DVT prophylaxis: lovenox Code Status: Full Family Communication: daughter at bedside  Disposition Plan: most likely back to independent living; but will need to be determine base on patient progression. Consults called: none    Disposition Plan: she is from assisted living, may need SNF   Procedures:  none  Antibiotics:  rocephin   Objective: BP 134/58 mmHg  Pulse 79   Temp(Src) 98.1 F (36.7 C) (Axillary)  Resp 16  Ht 5\' 1"  (1.549 m)  Wt 60.413 kg (133 lb 3 oz)  BMI 25.18 kg/m2  SpO2 94%  Intake/Output Summary (Last 24 hours) at 05/30/15 1244 Last data filed at 05/30/15 0900  Gross per 24 hour  Intake 1998.1 ml  Output 2330.7 ml  Net -332.6 ml   Filed Weights   05/28/15 0952 05/28/15 1030 05/28/15 1433  Weight: 63.504 kg (140 lb) 60.419 kg (133 lb 3.2 oz) 60.413 kg (133 lb 3 oz)    Exam:   General:  NAD  Cardiovascular: RRR  Respiratory: CTABL  Abdomen: Soft/ND/NT, positive BS  Musculoskeletal: No Edema  Neuro: aaox3  Data Reviewed: Basic Metabolic Panel:  Recent Labs Lab 05/28/15 1010 05/28/15 2043 05/29/15 0347 05/30/15 0351  NA 133*  --  140 142  K 3.2*  --  3.0* 3.8  CL 99*  --  107 108  CO2 24  --  25 26  GLUCOSE 277*  --  111* 111*  BUN 19  --  10 7  CREATININE 0.87 0.84 0.77 0.70  CALCIUM 8.7*  --  8.5* 8.8*  MG  --   --   --  1.9   Liver Function Tests:  Recent Labs Lab 05/28/15 1010 05/30/15 0351  AST 42* 27  ALT 39 34  ALKPHOS 88 79  BILITOT 0.4 0.5  PROT 6.9 6.6  ALBUMIN 3.1* 2.9*    Recent Labs Lab 05/28/15 1010  LIPASE 17   No results for input(s): AMMONIA in the last 168 hours. CBC:  Recent Labs Lab 05/28/15 1010 05/28/15 2043 05/29/15 0347 05/30/15 0351  WBC 10.7* 11.5* 10.1  7.6  NEUTROABS 9.7*  --   --   --   HGB 11.5* 10.7* 10.5* 11.2*  HCT 34.7* 32.1* 31.9* 34.2*  MCV 83.6 82.9 83.7 83.0  PLT 295 286 291 315   Cardiac Enzymes:   No results for input(s): CKTOTAL, CKMB, CKMBINDEX, TROPONINI in the last 168 hours. BNP (last 3 results) No results for input(s): BNP in the last 8760 hours.  ProBNP (last 3 results) No results for input(s): PROBNP in the last 8760 hours.  CBG:  Recent Labs Lab 05/29/15 1132 05/29/15 1727 05/29/15 2144 05/30/15 0747 05/30/15 1134  GLUCAP 97 106* 119* 303* 112*    Recent Results (from the past 240 hour(s))  Blood Culture  (routine x 2)     Status: Abnormal (Preliminary result)   Collection Time: 05/28/15 10:10 AM  Result Value Ref Range Status   Specimen Description BLOOD RT ARM  Final   Special Requests BOTTLES DRAWN AEROBIC AND ANAEROBIC  5CC  Final   Culture  Setup Time   Final    GRAM NEGATIVE RODS IN BOTH AEROBIC AND ANAEROBIC BOTTLES Organism ID to follow CRITICAL RESULT CALLED TO, READ BACK BY AND VERIFIED WITH: Rhae Lerner AT [id number] 05/29/15 BY L BENFIELD    Culture (A)  Final    ESCHERICHIA COLI SUSCEPTIBILITIES TO FOLLOW Performed at [facility]    Report Status PENDING  Incomplete  Blood Culture ID Panel (Reflexed)     Status: Abnormal   Collection Time: 05/28/15 10:10 AM  Result Value Ref Range Status   Enterococcus species NOT DETECTED NOT DETECTED Final   Vancomycin resistance NOT DETECTED NOT DETECTED Final   Listeria monocytogenes NOT DETECTED NOT DETECTED Final   Staphylococcus species NOT DETECTED NOT DETECTED Final   Staphylococcus aureus NOT DETECTED NOT DETECTED Final   Methicillin resistance NOT DETECTED NOT DETECTED Final   Streptococcus species NOT DETECTED NOT DETECTED Final   Streptococcus agalactiae NOT DETECTED NOT DETECTED Final   Streptococcus pneumoniae NOT DETECTED NOT DETECTED Final   Streptococcus pyogenes NOT DETECTED NOT DETECTED Final   Acinetobacter baumannii NOT DETECTED NOT DETECTED Final   Enterobacteriaceae species NOT DETECTED NOT DETECTED Final   Enterobacter cloacae complex NOT DETECTED NOT DETECTED Final   Escherichia coli DETECTED (A) NOT DETECTED Final    Comment: CRITICAL RESULT CALLED TO, READ BACK BY AND VERIFIED WITH: Ilda Basset D AT [id number] 05/29/15 BY L BENFIELD    Klebsiella oxytoca NOT DETECTED NOT DETECTED Final   Klebsiella pneumoniae NOT DETECTED NOT DETECTED Final   Proteus species NOT DETECTED NOT DETECTED Final   Serratia marcescens NOT DETECTED NOT DETECTED Final   Carbapenem resistance NOT DETECTED NOT DETECTED Final    Haemophilus influenzae NOT DETECTED NOT DETECTED Final   Neisseria meningitidis NOT DETECTED NOT DETECTED Final   Pseudomonas aeruginosa NOT DETECTED NOT DETECTED Final   Candida albicans NOT DETECTED NOT DETECTED Final   Candida glabrata NOT DETECTED NOT DETECTED Final   Candida krusei NOT DETECTED NOT DETECTED Final   Candida parapsilosis NOT DETECTED NOT DETECTED Final   Candida tropicalis NOT DETECTED NOT DETECTED Final    Comment: Performed at [facility]  Blood Culture (routine x 2)     Status: None (Preliminary result)   Collection Time: 05/28/15 10:15 AM  Result Value Ref Range Status   Specimen Description BLOOD  LEFT ARM  Final   Special Requests BOTTLES DRAWN AEROBIC AND ANAEROBIC  5CC  Final   Culture  Setup Time  Final    GRAM NEGATIVE RODS IN BOTH AEROBIC AND ANAEROBIC BOTTLES CRITICAL RESULT CALLED TO, READ BACK BY AND VERIFIED WITH: Ilda Basset D AT 0825 ON [id number] BY Rhea Bleacher Performed at [place]  Final   Report Status PENDING  Incomplete  Urine culture     Status: Abnormal   Collection Time: 05/28/15 11:10 AM  Result Value Ref Range Status   Specimen Description URINE, CATHETERIZED  Final   Special Requests NONE  Final   Culture (A)  Final    4,000 COLONIES/mL INSIGNIFICANT GROWTH Performed at [facility]    Report Status 05/29/2015 FINAL  Final     Studies: No results found.  Scheduled Meds: . aspirin EC  81 mg Oral Daily  . cefTRIAXone (ROCEPHIN)  IV  2 g Intravenous Q24H  . cholecalciferol  2,000 Units Oral QHS  . enoxaparin (LOVENOX) injection  40 mg Subcutaneous Q24H  . insulin aspart  0-9 Units Subcutaneous TID WC  . latanoprost  1 drop Both Eyes QHS  . [START ON 05/31/2015] LORazepam  0.5 mg Oral Q0200  . NIFEdipine  60 mg Oral Daily  . nystatin   Topical TID  . polyethylene glycol  17 g Oral QPM  . potassium chloride  20 mEq Oral Q breakfast  . sertraline  50 mg Oral Q breakfast   . simvastatin  40 mg Oral QHS  . torsemide  5 mg Oral Once per day on Sun Tue Thu Sat  . zolpidem  10 mg Oral QHS    Continuous Infusions:     Time spent: 49mins  Megan Presti MD, PhD  [facility] Pager [phone]. If 7PM-7AM, please contact night-coverage at [email or web address], password [facility] 05/30/2015, 12:44 PM  LOS: 2 days

## 2015-05-31 LAB — GLUCOSE, CAPILLARY
GLUCOSE-CAPILLARY: 122 mg/dL — AB (ref 65–99)
GLUCOSE-CAPILLARY: 162 mg/dL — AB (ref 65–99)
Glucose-Capillary: 109 mg/dL — ABNORMAL HIGH (ref 65–99)
Glucose-Capillary: 134 mg/dL — ABNORMAL HIGH (ref 65–99)

## 2015-05-31 LAB — CULTURE, BLOOD (ROUTINE X 2)

## 2015-05-31 LAB — BASIC METABOLIC PANEL
Anion gap: 12 (ref 5–15)
BUN: 9 mg/dL (ref 6–20)
CALCIUM: 9 mg/dL (ref 8.9–10.3)
CHLORIDE: 104 mmol/L (ref 101–111)
CO2: 23 mmol/L (ref 22–32)
CREATININE: 0.57 mg/dL (ref 0.44–1.00)
Glucose, Bld: 124 mg/dL — ABNORMAL HIGH (ref 65–99)
Potassium: 3.4 mmol/L — ABNORMAL LOW (ref 3.5–5.1)
SODIUM: 139 mmol/L (ref 135–145)

## 2015-05-31 LAB — MAGNESIUM: MAGNESIUM: 1.8 mg/dL (ref 1.7–2.4)

## 2015-05-31 LAB — HEMOGLOBIN A1C
HEMOGLOBIN A1C: 6.4 % — AB (ref 4.8–5.6)
MEAN PLASMA GLUCOSE: 137 mg/dL

## 2015-05-31 MED ORDER — POTASSIUM CHLORIDE CRYS ER 20 MEQ PO TBCR
40.0000 meq | EXTENDED_RELEASE_TABLET | Freq: Once | ORAL | Status: AC
  Administered 2015-05-31: 40 meq via ORAL
  Filled 2015-05-31: qty 2

## 2015-05-31 MED ORDER — BISACODYL 10 MG RE SUPP
10.0000 mg | Freq: Once | RECTAL | Status: AC
  Administered 2015-05-31: 10 mg via RECTAL
  Filled 2015-05-31: qty 1

## 2015-05-31 MED ORDER — MAGNESIUM HYDROXIDE 400 MG/5ML PO SUSP
5.0000 mL | Freq: Every day | ORAL | Status: DC
  Administered 2015-05-31 – 2015-06-01 (×2): 5 mL via ORAL
  Filled 2015-05-31 (×2): qty 30

## 2015-05-31 MED ORDER — SENNOSIDES-DOCUSATE SODIUM 8.6-50 MG PO TABS
2.0000 | ORAL_TABLET | Freq: Two times a day (BID) | ORAL | Status: DC
  Administered 2015-05-31: 2 via ORAL
  Filled 2015-05-31 (×2): qty 2

## 2015-05-31 NOTE — Care Management Important Message (Signed)
Important Message  Patient Details  Name: SAMEEHA WICHMANN MRN: [id number] Date of Birth: 01-28-1924   Medicare Important Message Given:  Yes    Camillo Flaming 05/31/2015, 9:17 AMImportant Message  Patient Details  Name: DYMON HAYE MRN: [id number] Date of Birth: 1924-10-22   Medicare Important Message Given:  Yes    Camillo Flaming 05/31/2015, 9:17 AM

## 2015-05-31 NOTE — Progress Notes (Signed)
PROGRESS NOTE  Jamie Brown [id number] DOB: 1924/05/20 DOA: 05/28/2015 PCP: Javier Glazier, MD  HPI/Recap of past 24 hours:  Feeling better, no fever,  No Family in room  Assessment/Plan: Principal Problem:   Sepsis ([place]) Active Problems:   Squamous cell carcinoma of skin of neck   UTI (lower urinary tract infection)   HLD (hyperlipidemia)   HTN (hypertension)   Lactic acidosis   Insomnia  1-Sepsis ([place]): due to UTI/ecoli bacteremia . On admission had tachypnea, temp of 103; HR 101; RR 29 and lactic acid of 3.44. Source is her urine. -sepsis protocol initiated while in ED -continue empiric rocephin -after IVF's resuscitation patient hemodynamically stable and with normal lactic acid. d/c ivf - repeat blood culture pending  2-Squamous cell carcinoma of skin of neck: -has completed radiation therapy in 2016 -per family members info in remission   3-UTI (lower urinary tract infection): -insignificant growth -off ivf/ cotninue abx  4-HLD (hyperlipidemia): -continue statins  5-HTN (hypertension): stable and well controlled -will continue current antihypertensive strategy. Patient want regular diet  6-Lactic acidosis: -Due to UTI/sepsis -normalized.  7-skin fungal infection -will treat with nystatin powder   8-depression/anxiety/Insomnia: -continue Zoloft, QHS Ambien and PRN ativan  9-hyperglycemia: - A1C pending -SSI -CBG's 277 on admission; no prior hx of diabetes   DVT prophylaxis: lovenox Code Status: Full Family Communication: daughter updated over the phone  Disposition Plan: most likely back to independent living on 5/9 Consults called: none     Procedures:  none  Antibiotics:  rocephin   Objective: BP 143/57 mmHg  Pulse 98  Temp(Src) 98.3 F (36.8 C) (Oral)  Resp 16  Ht 5\' 1"  (1.549 m)  Wt 60.413 kg (133 lb 3 oz)  BMI 25.18 kg/m2  SpO2 95%  Intake/Output Summary (Last 24 hours) at 05/31/15 0811 Last data filed at 05/30/15  1700  Gross per 24 hour  Intake    240 ml  Output    550 ml  Net   -310 ml   Filed Weights   05/28/15 0952 05/28/15 1030 05/28/15 1433  Weight: 63.504 kg (140 lb) 60.419 kg (133 lb 3.2 oz) 60.413 kg (133 lb 3 oz)    Exam:   General:  NAD  Cardiovascular: RRR  Respiratory: CTABL  Abdomen: Soft/ND/NT, positive BS  Musculoskeletal: No Edema  Neuro: aaox3  Data Reviewed: Basic Metabolic Panel:  Recent Labs Lab 05/28/15 1010 05/28/15 2043 05/29/15 0347 05/30/15 0351 05/31/15 0400  NA 133*  --  140 142 139  K 3.2*  --  3.0* 3.8 3.4*  CL 99*  --  107 108 104  CO2 24  --  25 26 23   GLUCOSE 277*  --  111* 111* 124*  BUN 19  --  10 7 9   CREATININE 0.87 0.84 0.77 0.70 0.57  CALCIUM 8.7*  --  8.5* 8.8* 9.0  MG  --   --   --  1.9 1.8   Liver Function Tests:  Recent Labs Lab 05/28/15 1010 05/30/15 0351  AST 42* 27  ALT 39 34  ALKPHOS 88 79  BILITOT 0.4 0.5  PROT 6.9 6.6  ALBUMIN 3.1* 2.9*    Recent Labs Lab 05/28/15 1010  LIPASE 17   No results for input(s): AMMONIA in the last 168 hours. CBC:  Recent Labs Lab 05/28/15 1010 05/28/15 2043 05/29/15 0347 05/30/15 0351  WBC 10.7* 11.5* 10.1 7.6  NEUTROABS 9.7*  --   --   --   HGB 11.5* 10.7*  10.5* 11.2*  HCT 34.7* 32.1* 31.9* 34.2*  MCV 83.6 82.9 83.7 83.0  PLT 295 286 291 315   Cardiac Enzymes:   No results for input(s): CKTOTAL, CKMB, CKMBINDEX, TROPONINI in the last 168 hours. BNP (last 3 results) No results for input(s): BNP in the last 8760 hours.  ProBNP (last 3 results) No results for input(s): PROBNP in the last 8760 hours.  CBG:  Recent Labs Lab 05/30/15 0747 05/30/15 1134 05/30/15 1650 05/30/15 2119 05/31/15 0723  GLUCAP 303* 112* 91 144* 122*    Recent Results (from the past 240 hour(s))  Blood Culture (routine x 2)     Status: Abnormal (Preliminary result)   Collection Time: 05/28/15 10:10 AM  Result Value Ref Range Status   Specimen Description BLOOD RT ARM  Final    Special Requests BOTTLES DRAWN AEROBIC AND ANAEROBIC  5CC  Final   Culture  Setup Time   Final    GRAM NEGATIVE RODS IN BOTH AEROBIC AND ANAEROBIC BOTTLES Organism ID to follow CRITICAL RESULT CALLED TO, READ BACK BY AND VERIFIED WITH: Rhae Lerner AT [id number] 05/29/15 BY L BENFIELD    Culture (A)  Final    ESCHERICHIA COLI SUSCEPTIBILITIES TO FOLLOW Performed at [facility]    Report Status PENDING  Incomplete  Blood Culture ID Panel (Reflexed)     Status: Abnormal   Collection Time: 05/28/15 10:10 AM  Result Value Ref Range Status   Enterococcus species NOT DETECTED NOT DETECTED Final   Vancomycin resistance NOT DETECTED NOT DETECTED Final   Listeria monocytogenes NOT DETECTED NOT DETECTED Final   Staphylococcus species NOT DETECTED NOT DETECTED Final   Staphylococcus aureus NOT DETECTED NOT DETECTED Final   Methicillin resistance NOT DETECTED NOT DETECTED Final   Streptococcus species NOT DETECTED NOT DETECTED Final   Streptococcus agalactiae NOT DETECTED NOT DETECTED Final   Streptococcus pneumoniae NOT DETECTED NOT DETECTED Final   Streptococcus pyogenes NOT DETECTED NOT DETECTED Final   Acinetobacter baumannii NOT DETECTED NOT DETECTED Final   Enterobacteriaceae species NOT DETECTED NOT DETECTED Final   Enterobacter cloacae complex NOT DETECTED NOT DETECTED Final   Escherichia coli DETECTED (A) NOT DETECTED Final    Comment: CRITICAL RESULT CALLED TO, READ BACK BY AND VERIFIED WITH: Ilda Basset D AT [id number] 05/29/15 BY L BENFIELD    Klebsiella oxytoca NOT DETECTED NOT DETECTED Final   Klebsiella pneumoniae NOT DETECTED NOT DETECTED Final   Proteus species NOT DETECTED NOT DETECTED Final   Serratia marcescens NOT DETECTED NOT DETECTED Final   Carbapenem resistance NOT DETECTED NOT DETECTED Final   Haemophilus influenzae NOT DETECTED NOT DETECTED Final   Neisseria meningitidis NOT DETECTED NOT DETECTED Final   Pseudomonas aeruginosa NOT DETECTED NOT DETECTED Final    Candida albicans NOT DETECTED NOT DETECTED Final   Candida glabrata NOT DETECTED NOT DETECTED Final   Candida krusei NOT DETECTED NOT DETECTED Final   Candida parapsilosis NOT DETECTED NOT DETECTED Final   Candida tropicalis NOT DETECTED NOT DETECTED Final    Comment: Performed at [facility]  Blood Culture (routine x 2)     Status: None (Preliminary result)   Collection Time: 05/28/15 10:15 AM  Result Value Ref Range Status   Specimen Description BLOOD  LEFT ARM  Final   Special Requests BOTTLES DRAWN AEROBIC AND ANAEROBIC  5CC  Final   Culture  Setup Time   Final    GRAM NEGATIVE RODS IN BOTH AEROBIC AND ANAEROBIC BOTTLES CRITICAL RESULT CALLED  TO, READ BACK BY AND VERIFIED WITH: Ilda Basset D AT 0825 ON [id number] BY Rhea Bleacher Performed at [place]  Final   Report Status PENDING  Incomplete  Urine culture     Status: Abnormal   Collection Time: 05/28/15 11:10 AM  Result Value Ref Range Status   Specimen Description URINE, CATHETERIZED  Final   Special Requests NONE  Final   Culture (A)  Final    4,000 COLONIES/mL INSIGNIFICANT GROWTH Performed at [facility]    Report Status 05/29/2015 FINAL  Final     Studies: No results found.  Scheduled Meds: . aspirin EC  81 mg Oral Daily  . cefTRIAXone (ROCEPHIN)  IV  2 g Intravenous Q24H  . cholecalciferol  2,000 Units Oral QHS  . enoxaparin (LOVENOX) injection  40 mg Subcutaneous Q24H  . insulin aspart  0-9 Units Subcutaneous TID WC  . latanoprost  1 drop Both Eyes QHS  . LORazepam  0.5 mg Oral Q0200  . NIFEdipine  60 mg Oral Daily  . nystatin   Topical TID  . polyethylene glycol  17 g Oral QPM  . potassium chloride  20 mEq Oral Q breakfast  . potassium chloride  40 mEq Oral Once  . sertraline  50 mg Oral Q breakfast  . simvastatin  40 mg Oral QHS  . torsemide  5 mg Oral Once per day on Sun Tue Thu Sat  . zolpidem  10 mg Oral QHS    Continuous Infusions:      Time spent: 63mins  Humbert Morozov MD, PhD  [facility] Pager [phone]. If 7PM-7AM, please contact night-coverage at [email or web address], password [facility] 05/31/2015, 8:11 AM  LOS: 3 days

## 2015-06-01 DIAGNOSIS — K5909 Other constipation: Secondary | ICD-10-CM

## 2015-06-01 LAB — BASIC METABOLIC PANEL
Anion gap: 9 (ref 5–15)
BUN: 13 mg/dL (ref 6–20)
CALCIUM: 9 mg/dL (ref 8.9–10.3)
CHLORIDE: 104 mmol/L (ref 101–111)
CO2: 26 mmol/L (ref 22–32)
CREATININE: 0.67 mg/dL (ref 0.44–1.00)
GFR calc Af Amer: >60 mL/min (ref >60)
GLUCOSE: 116 mg/dL — AB (ref 65–99)
POTASSIUM: 3.8 mmol/L (ref 3.5–5.1)
SODIUM: 139 mmol/L (ref 135–145)

## 2015-06-01 LAB — GLUCOSE, CAPILLARY: Glucose-Capillary: 100 mg/dL — ABNORMAL HIGH (ref 65–99)

## 2015-06-01 LAB — MAGNESIUM: Magnesium: 2.1 mg/dL (ref 1.7–2.4)

## 2015-06-01 MED ORDER — FLORANEX PO PACK: 1.0000 g | PACK | Freq: Three times a day (TID) | ORAL | Status: DC

## 2015-06-01 MED ORDER — CEPHALEXIN 750 MG PO CAPS: 750.0000 mg | ORAL_CAPSULE | Freq: Three times a day (TID) | ORAL | Status: DC

## 2015-06-01 NOTE — Discharge Summary (Signed)
Discharge Summary  AKAISHA Brown [id number] DOB: 04-22-1924  PCP: Javier Glazier, MD  Admit date: 05/28/2015 Discharge date: 06/01/2015  Time spent: <11mins  Recommendations for Outpatient Follow-up:  1. F/u with PMD within a week  for hospital discharge follow up, repeat cbc/bmp at follow up, pmd to follow up on repeat blood culture final result.   Discharge Diagnoses:  Active Hospital Problems   Diagnosis Date Noted  . Sepsis ([place]) 05/28/2015  . UTI (lower urinary tract infection) 05/28/2015  . HLD (hyperlipidemia) 05/28/2015  . HTN (hypertension) 05/28/2015  . Lactic acidosis 05/28/2015  . Insomnia 05/28/2015  . Squamous cell carcinoma of skin of neck 10/29/2014    Resolved Hospital Problems   Diagnosis Date Noted Date Resolved  No resolved problems to display.    Discharge Condition: stable  Diet recommendation: heart healthy/carb modified  Filed Weights   05/28/15 0952 05/28/15 1030 05/28/15 1433  Weight: 63.504 kg (140 lb) 60.419 kg (133 lb 3.2 oz) 60.413 kg (133 lb 3 oz)    History of present illness:  Patient coming from: independent living facility   Chief Complaint: general malaise, fever  HPI: Jamie Brown is a 82 y.o. 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 Patient endorses some increase frequency as well. Symptoms present and worsening over the last 2 days or so. No CP, SOB, cough, nausea, vomiting, abd pain, HA's, hematochezia or any other complaints.  ED Course: patient with neg CXR. Sepsis features appreciated on exam (elevated lactic acid, mildly tachycardic; tachypneic and with temp of 103). initiated on sepsis protocol, cultures taken and empirically started on rocephin.   Hospital Course:  Principal Problem:   Sepsis ([place]) Active Problems:   Squamous cell  carcinoma of skin of neck   UTI (lower urinary tract infection)   HLD (hyperlipidemia)   HTN (hypertension)   Lactic acidosis   Insomnia  1-Sepsis ([place]): due to UTI/ecoli bacteremia . On admission had tachypnea, temp of 103; HR 101; RR 29 and lactic acid of 3.44. Source is her urine. -sepsis protocol initiated while in ED -continue empiric rocephin, due to pan sensitive ecoli bacteremia, she is treated with higher dose of rocephin 2gram daily -after IVF's resuscitation patient hemodynamically stable and with normal lactic acid. d/c ivf - repeat blood culture no growth, patient is feeling better, she is discharged back to independent living with keflex to finish total of 14day abx treatment.  2-Squamous cell carcinoma of skin of neck: -has completed radiation therapy in 2016 -per family members info in remission   3-UTI (lower urinary tract infection): -insignificant growth -off ivf/ cotninue abx  4-HLD (hyperlipidemia): -continue statins  5-HTN (hypertension): stable and well controlled -will continue current antihypertensive strategy. Patient wants regular diet  6-Lactic acidosis: -Due to UTI/sepsis -normalized.  7-skin fungal infection -better with nystatin powder   8-depression/anxiety/Insomnia: -continue Zoloft, QHS Ambien and PRN ativan  9-hyperglycemia: - A1C 6.4 -SSI -CBG's 277 on admission; no prior hx of diabetes -pmd to continue follow  10- constipation: stool softener  Code Status: Full Family Communication: daughter in the room  Disposition Plan: discharge to independent living on 5/9 Consults called: none     Procedures:  none  Antibiotics:  rocephin   Discharge Exam: BP 136/65 mmHg  Pulse 85  Temp(Src) 97.9 F (36.6 C) (Oral)  Resp 16  Ht 5\' 1"  (1.549 m)  Wt 60.413 kg (133 lb 3 oz)  BMI 25.18 kg/m2  SpO2 98%   General: NAD  Cardiovascular: RRR  Respiratory: CTABL  Abdomen: Soft/ND/NT, positive BS  Musculoskeletal: No  Edema  Neuro: aaox3   Discharge Instructions You were cared for by a hospitalist during your hospital stay. If you have any questions about your discharge medications or the care you received while you were in the hospital after you are discharged, you can call the unit and asked to speak with the hospitalist on call if the hospitalist that took care of you is not available. Once you are discharged, your primary care physician will handle any further medical issues. Please note that NO REFILLS for any discharge medications will be authorized once you are discharged, as it is imperative that you return to your primary care physician (or establish a relationship with a primary care physician if you do not have one) for your aftercare needs so that they can reassess your need for medications and monitor your lab values.      Discharge Instructions    Diet - low sodium heart healthy    Complete by:  As directed   Carb modified.     Increase activity slowly    Complete by:  As directed             Medication List    TAKE these medications        aspirin EC 81 MG tablet  Take 81 mg by mouth daily.     cephALEXin 750 MG capsule  Commonly known as:  KEFLEX  Take 1 capsule (750 mg total) by mouth 3 (three) times daily.     CRANBERRY EXTRACT PO  Take 25,000 mg by mouth at bedtime.     Fish Oil 1200 MG Caps  Take 1,200 mg by mouth at bedtime.     glycerin (Pediatric) 1.2 g Supp  Place 1 suppository rectally daily as needed for moderate constipation.     ibuprofen 200 MG tablet  Commonly known as:  ADVIL,MOTRIN  Take 600 mg by mouth every 6 (six) hours as needed for fever, headache, mild pain, moderate pain or cramping.     lactobacillus Pack  Take 1 packet (1 g total) by mouth 3 (three) times daily with meals.     LORazepam 0.5 MG tablet  Commonly known as:  ATIVAN  Take 0.5 mg by mouth at bedtime. Usually takes between 12am and 2pm when she gets up to use restroom      NIFEdipine 60 MG 24 hr tablet  Commonly known as:  PROCARDIA XL/ADALAT-CC  Take 60 mg by mouth daily.     polyethylene glycol packet  Commonly known as:  MIRALAX / GLYCOLAX  Take 17 g by mouth every evening.     potassium chloride 10 MEQ tablet  Commonly known as:  K-DUR,KLOR-CON  Take 10 mEq by mouth daily with breakfast.     psyllium 58.6 % powder  Commonly known as:  METAMUCIL  Take 1 packet by mouth daily with breakfast.     sertraline 50 MG tablet  Commonly known as:  ZOLOFT  Take 50 mg by mouth daily with breakfast.     simvastatin 40 MG tablet  Commonly known as:  ZOCOR  Take 40 mg by mouth at bedtime.     STOOL SOFTENER 100 MG capsule  Generic drug:  docusate sodium  Take 100 mg by mouth daily as needed for mild  constipation.     torsemide 10 MG tablet  Commonly known as:  DEMADEX  Take 5 mg by mouth See admin instructions. Takes on Tuesday, Thursday, Saturday, and Sunday     traMADol 50 MG tablet  Commonly known as:  ULTRAM  Take 100 mg by mouth at bedtime as needed (for pain).     traMADol 50 MG tablet  Commonly known as:  ULTRAM  Take 100 mg by mouth 2 (two) times daily.     Vitamin D 2000 units tablet  Take 2,000 Units by mouth at bedtime.     ZIOPTAN 0.0015 % Soln  Generic drug:  Tafluprost  Place 1 drop into both eyes at bedtime.     zolpidem 10 MG tablet  Commonly known as:  AMBIEN  Take 10 mg by mouth at bedtime.       Allergies  Allergen Reactions  . Codeine Itching    Hives  . Gabapentin Other (See Comments)    Difficulty finding words and foggy feeling  . Isoptin [Verapamil] Itching  . Remeron [Mirtazapine] Other (See Comments)    Muscle cramping and pain  . Trazodone And Nefazodone Other (See Comments)    Mental confusion  . Aldara [Imiquimod] Rash    Redness    Follow-up Information    Follow up with PIAZZA, Christian Mate, MD.   Specialty:  Internal Medicine   Why:  hospital discharge follow up, repeat cbc /bmp at follow up    Contact information:   [address] [place] [address] [phone]        The results of significant diagnostics from this hospitalization (including imaging, microbiology, ancillary and laboratory) are listed below for reference.    Significant Diagnostic Studies: Dg Chest 2 View  05/28/2015  CLINICAL DATA:  Fever. EXAM: CHEST  2 VIEW COMPARISON:  May 14, 2015. FINDINGS: The heart size and mediastinal contours are within normal limits. Both lungs are clear. The visualized skeletal structures are unremarkable. IMPRESSION: No active cardiopulmonary disease. Electronically Signed   By: Marijo Conception, M.D.   On: 05/28/2015 12:15    Microbiology: Recent Results (from the past 240 hour(s))  Blood Culture (routine x 2)     Status: Abnormal   Collection Time: 05/28/15 10:10 AM  Result Value Ref Range Status   Specimen Description BLOOD RT ARM  Final   Special Requests BOTTLES DRAWN AEROBIC AND ANAEROBIC  5CC  Final   Culture  Setup Time   Final    GRAM NEGATIVE RODS IN BOTH AEROBIC AND ANAEROBIC BOTTLES Organism ID to follow CRITICAL RESULT CALLED TO, READ BACK BY AND VERIFIED WITH: Rhae Lerner AT [id number] 05/29/15 BY L BENFIELD Performed at [facility] (A)  Final   Report Status 05/31/2015 FINAL  Final   Organism ID, Bacteria ESCHERICHIA COLI  Final      Susceptibility   Escherichia coli - MIC*    AMPICILLIN <=2 SENSITIVE Sensitive     CEFAZOLIN <=4 SENSITIVE Sensitive     CEFEPIME <=1 SENSITIVE Sensitive     CEFTAZIDIME <=1 SENSITIVE Sensitive     CEFTRIAXONE <=1 SENSITIVE Sensitive     CIPROFLOXACIN <=0.25 SENSITIVE Sensitive     GENTAMICIN <=1 SENSITIVE Sensitive     IMIPENEM <=0.25 SENSITIVE Sensitive     TRIMETH/SULFA <=20 SENSITIVE Sensitive     AMPICILLIN/SULBACTAM <=2 SENSITIVE Sensitive     PIP/TAZO <=4 SENSITIVE Sensitive     * ESCHERICHIA COLI  Blood Culture ID Panel (Reflexed)     Status: Abnormal   Collection Time:  05/28/15 10:10 AM  Result Value Ref Range Status   Enterococcus species NOT DETECTED NOT DETECTED Final   Vancomycin resistance NOT DETECTED NOT DETECTED Final   Listeria monocytogenes NOT DETECTED NOT DETECTED Final   Staphylococcus species NOT DETECTED NOT DETECTED Final   Staphylococcus aureus NOT DETECTED NOT DETECTED Final   Methicillin resistance NOT DETECTED NOT DETECTED Final   Streptococcus species NOT DETECTED NOT DETECTED Final   Streptococcus agalactiae NOT DETECTED NOT DETECTED Final   Streptococcus pneumoniae NOT DETECTED NOT DETECTED Final   Streptococcus pyogenes NOT DETECTED NOT DETECTED Final   Acinetobacter baumannii NOT DETECTED NOT DETECTED Final   Enterobacteriaceae species NOT DETECTED NOT DETECTED Final   Enterobacter cloacae complex NOT DETECTED NOT DETECTED Final   Escherichia coli DETECTED (A) NOT DETECTED Final    Comment: CRITICAL RESULT CALLED TO, READ BACK BY AND VERIFIED WITH: Ilda Basset D AT [id number] 05/29/15 BY L BENFIELD    Klebsiella oxytoca NOT DETECTED NOT DETECTED Final   Klebsiella pneumoniae NOT DETECTED NOT DETECTED Final   Proteus species NOT DETECTED NOT DETECTED Final   Serratia marcescens NOT DETECTED NOT DETECTED Final   Carbapenem resistance NOT DETECTED NOT DETECTED Final   Haemophilus influenzae NOT DETECTED NOT DETECTED Final   Neisseria meningitidis NOT DETECTED NOT DETECTED Final   Pseudomonas aeruginosa NOT DETECTED NOT DETECTED Final   Candida albicans NOT DETECTED NOT DETECTED Final   Candida glabrata NOT DETECTED NOT DETECTED Final   Candida krusei NOT DETECTED NOT DETECTED Final   Candida parapsilosis NOT DETECTED NOT DETECTED Final   Candida tropicalis NOT DETECTED NOT DETECTED Final    Comment: Performed at [facility]  Blood Culture (routine x 2)     Status: Abnormal   Collection Time: 05/28/15 10:15 AM  Result Value Ref Range Status   Specimen Description BLOOD  LEFT ARM  Final   Special Requests BOTTLES DRAWN  AEROBIC AND ANAEROBIC  5CC  Final   Culture  Setup Time   Final    GRAM NEGATIVE RODS IN BOTH AEROBIC AND ANAEROBIC BOTTLES CRITICAL RESULT CALLED TO, READ BACK BY AND VERIFIED WITH: J. GADHIA, PHARM D AT 0825 ON [id number] BY Rhea Bleacher    Culture (A)  Final    ESCHERICHIA COLI SUSCEPTIBILITIES PERFORMED ON PREVIOUS CULTURE WITHIN THE LAST 5 DAYS. Performed at [facility]    Report Status 05/31/2015 FINAL  Final  Urine culture     Status: Abnormal   Collection Time: 05/28/15 11:10 AM  Result Value Ref Range Status   Specimen Description URINE, CATHETERIZED  Final   Special Requests NONE  Final   Culture (A)  Final    4,000 COLONIES/mL INSIGNIFICANT GROWTH Performed at [facility]    Report Status 05/29/2015 FINAL  Final  Culture, blood (routine x 2)     Status: None (Preliminary result)   Collection Time: 05/30/15  1:05 PM  Result Value Ref Range Status   Specimen Description BLOOD LEFT ANTECUBITAL  Final   Special Requests IN PEDIATRIC BOTTLE [place]  Final   Culture   Final    NO GROWTH < 24 HOURS Performed at [facility]    Report Status PENDING  Incomplete  Culture, blood (routine x 2)     Status: None (Preliminary result)   Collection Time: 05/30/15  1:15 PM  Result Value Ref Range Status   Specimen Description BLOOD  LEFT ARM  Final   Special Requests BOTTLES DRAWN AEROBIC AND ANAEROBIC 10CC  Final   Culture   Final    NO GROWTH < 24 HOURS Performed at [facility]    Report Status PENDING  Incomplete     Labs: Basic Metabolic Panel:  Recent Labs Lab 05/28/15 1010 05/28/15 2043 05/29/15 0347 05/30/15 0351 05/31/15 0400 06/01/15 0357  NA 133*  --  140 142 139 139  K 3.2*  --  3.0* 3.8 3.4* 3.8  CL 99*  --  107 108 104 104  CO2 24  --  25 26 23 26   GLUCOSE 277*  --  111* 111* 124* 116*  BUN 19  --  10 7 9 13   CREATININE 0.87 0.84 0.77 0.70 0.57 0.67  CALCIUM 8.7*  --  8.5* 8.8* 9.0 9.0  MG  --   --   --  1.9 1.8 2.1   Liver  Function Tests:  Recent Labs Lab 05/28/15 1010 05/30/15 0351  AST 42* 27  ALT 39 34  ALKPHOS 88 79  BILITOT 0.4 0.5  PROT 6.9 6.6  ALBUMIN 3.1* 2.9*    Recent Labs Lab 05/28/15 1010  LIPASE 17   No results for input(s): AMMONIA in the last 168 hours. CBC:  Recent Labs Lab 05/28/15 1010 05/28/15 2043 05/29/15 0347 05/30/15 0351  WBC 10.7* 11.5* 10.1 7.6  NEUTROABS 9.7*  --   --   --   HGB 11.5* 10.7* 10.5* 11.2*  HCT 34.7* 32.1* 31.9* 34.2*  MCV 83.6 82.9 83.7 83.0  PLT 295 286 291 315   Cardiac Enzymes: No results for input(s): CKTOTAL, CKMB, CKMBINDEX, TROPONINI in the last 168 hours. BNP: BNP (last 3 results) No results for input(s): BNP in the last 8760 hours.  ProBNP (last 3 results) No results for input(s): PROBNP in the last 8760 hours.  CBG:  Recent Labs Lab 05/31/15 0723 05/31/15 1148 05/31/15 1654 05/31/15 2136 06/01/15 0730  GLUCAP 122* 109* 134* 162* 100*       Signed:  Rulon Abdalla MD, PhD  [facility] 06/01/2015, 9:51 AM

## 2015-06-01 NOTE — Care Management Note (Signed)
Case Management Note  Patient Details  Name: Jamie Brown MRN: [id number] Date of Birth: 1924-12-29  Subjective/Objective:       82 yo admitted with Sepsis             Action/Plan: Pt from [place] and to return there at [place].  PT recommendations were for no PT follow up.  Pt has rollator at home.  No CM needs communicated.  Expected Discharge Date:   (UNKNOWN)               Expected Discharge Plan:  Home/Self Care  In-House Referral:     Discharge planning Services  CM Consult  Post Acute Care Choice:    Choice offered to:     DME Arranged:    DME Agency:     HH Arranged:    [place] Agency:     Status of Service:  Completed, signed off  Medicare Important Message Given:  Yes Date Medicare IM Given:    Medicare IM give by:    Date Additional Medicare IM Given:    Additional Medicare Important Message give by:     If discussed at [place] of Stay Meetings, dates discussed:    Additional Comments:  Lynnell Catalan, RN 06/01/2015, 9:43 AM

## 2015-06-04 LAB — CULTURE, BLOOD (ROUTINE X 2)
Culture: NO GROWTH
Culture: NO GROWTH

## 2015-08-15 ENCOUNTER — Emergency Department (HOSPITAL_BASED_OUTPATIENT_CLINIC_OR_DEPARTMENT_OTHER)
Admission: EM | Admit: 2015-08-15 | Discharge: 2015-08-15 | Disposition: A | Discharge status: Home / Self Care | Payer: Medicare Other | Attending: Emergency Medicine | Admitting: Emergency Medicine

## 2015-08-15 ENCOUNTER — Encounter (HOSPITAL_BASED_OUTPATIENT_CLINIC_OR_DEPARTMENT_OTHER): Payer: Self-pay | Admitting: *Deleted

## 2015-08-15 DIAGNOSIS — Z791 Long term (current) use of non-steroidal anti-inflammatories (NSAID): Secondary | ICD-10-CM | POA: Insufficient documentation

## 2015-08-15 DIAGNOSIS — I10 Essential (primary) hypertension: Secondary | ICD-10-CM | POA: Diagnosis not present

## 2015-08-15 DIAGNOSIS — L309 Dermatitis, unspecified: Secondary | ICD-10-CM | POA: Diagnosis not present

## 2015-08-15 DIAGNOSIS — Z79899 Other long term (current) drug therapy: Secondary | ICD-10-CM | POA: Insufficient documentation

## 2015-08-15 DIAGNOSIS — E785 Hyperlipidemia, unspecified: Secondary | ICD-10-CM | POA: Diagnosis not present

## 2015-08-15 DIAGNOSIS — Z7982 Long term (current) use of aspirin: Secondary | ICD-10-CM | POA: Diagnosis not present

## 2015-08-15 DIAGNOSIS — I2510 Atherosclerotic heart disease of native coronary artery without angina pectoris: Secondary | ICD-10-CM | POA: Diagnosis not present

## 2015-08-15 DIAGNOSIS — R21 Rash and other nonspecific skin eruption: Secondary | ICD-10-CM | POA: Diagnosis present

## 2015-08-15 MED ORDER — PREDNISONE 10 MG PO TABS
60.0000 mg | ORAL_TABLET | Freq: Once | ORAL | Status: AC
  Administered 2015-08-15: 60 mg via ORAL
  Filled 2015-08-15: qty 1

## 2015-08-15 MED ORDER — PREDNISONE 20 MG PO TABS: ORAL_TABLET | ORAL | 0 refills | Status: DC

## 2015-08-15 NOTE — ED Provider Notes (Signed)
[facility] DEPT [facility] Provider Note   CSN: [id number] Arrival date & time: 08/15/15  [id number]  First Provider Contact:  None       History   Chief Complaint Chief Complaint  Patient presents with  . Cellulitis    HPI Jamie Brown is a 82 y.o. female.  HPI  82 year old female with a history of squamous cell carcinoma in her right neck status post radiation who presents to the emergency department with recurrent right cheek and right neck rash. Sometimes this goes away with Rocephin IM shots within often comes back while on antibiotics afterwards. She is in no fever with these symptoms that swollen symptoms is not sometimes it slightly warm since then does not. There is no difficult swallowing or seen. Most recently she finishes her course of doxycycline her last dose was this morning however the redness is worsening over the last 12 hours. No systemic symptoms or fever during that time.  Past Medical History:  Diagnosis Date  . Coronary artery disease   . High cholesterol   . Hypertension   . Radiation 11/19/14-12/30/14   upper right neck 5000 cGy  . Squamous cell carcinoma [facility])     Patient Active Problem List   Diagnosis Date Noted  . Sepsis ([place]) 05/28/2015  . UTI (lower urinary tract infection) 05/28/2015  . HLD (hyperlipidemia) 05/28/2015  . HTN (hypertension) 05/28/2015  . Lactic acidosis 05/28/2015  . Insomnia 05/28/2015  . 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 BACK SURGERY    . CESAREAN SECTION    . ROTATOR CUFF REPAIR Bilateral     OB History    No data available       Home Medications    Prior to Admission medications   Medication Sig Start Date End Date Taking? Authorizing Provider  aspirin EC 81 MG tablet Take 81 mg by mouth daily.   Yes Historical Provider, MD  Cholecalciferol (VITAMIN D) 2000 units tablet Take 2,000 Units by mouth at bedtime.   Yes Historical Provider, MD    CRANBERRY EXTRACT PO Take 25,000 mg by mouth at bedtime.    Yes Historical Provider, MD  docusate sodium (STOOL SOFTENER) 100 MG capsule Take 100 mg by mouth daily as needed for mild constipation.   Yes Historical Provider, MD  glycerin, Pediatric, 1.2 g SUPP Place 1 suppository rectally daily as needed for moderate constipation.   Yes Historical Provider, MD  ibuprofen (ADVIL,MOTRIN) 200 MG tablet Take 600 mg by mouth every 6 (six) hours as needed for fever, headache, mild pain, moderate pain or cramping.   Yes Historical Provider, MD  loratadine (CLARITIN) 10 MG tablet Take 10 mg by mouth daily.   Yes Historical Provider, MD  LORazepam (ATIVAN) 0.5 MG tablet Take 0.5 mg by mouth at bedtime. Usually takes between 12am and 2pm when she gets up to use restroom   Yes Historical Provider, MD  NIFEdipine (PROCARDIA XL/ADALAT-CC) 60 MG 24 hr tablet Take 60 mg by mouth daily.   Yes Historical Provider, MD  Omega-3 Fatty Acids (FISH OIL) 1200 MG CAPS Take 1,200 mg by mouth at bedtime.   Yes Historical Provider, MD  polyethylene glycol (MIRALAX / GLYCOLAX) packet Take 17 g by mouth every evening.   Yes Historical Provider, MD  potassium chloride (K-DUR,KLOR-CON) 10 MEQ tablet Take 10 mEq by mouth daily with breakfast.   Yes Historical Provider, MD  psyllium (METAMUCIL) 58.6 % powder  Take 1 packet by mouth daily with breakfast.   Yes Historical Provider, MD  sertraline (ZOLOFT) 50 MG tablet Take 50 mg by mouth daily with breakfast.   Yes Historical Provider, MD  simvastatin (ZOCOR) 40 MG tablet Take 40 mg by mouth at bedtime.    Yes Historical Provider, MD  Tafluprost (ZIOPTAN) 0.0015 % SOLN Place 1 drop into both eyes at bedtime.   Yes Historical Provider, MD  torsemide (DEMADEX) 10 MG tablet Take 5 mg by mouth See admin instructions. Takes on Tuesday, Thursday, Saturday, and Sunday   Yes Historical Provider, MD  traMADol (ULTRAM) 50 MG tablet Take 100 mg by mouth 2 (two) times daily.    Yes Historical  Provider, MD  zolpidem (AMBIEN) 10 MG tablet Take 10 mg by mouth at bedtime.   Yes Historical Provider, MD  predniSONE (DELTASONE) 20 MG tablet 3 tabs po daily x 3 days, then 2 tabs x 3 days, then 1.5 tabs x 3 days, then 1 tab x 3 days, then 0.5 tabs x 3 days 08/16/15   Merrily Pew, MD    Family History No family history on file.  Social History Social History  Substance Use Topics  . Smoking status: Never Smoker  . Smokeless tobacco: Never Used  . Alcohol use No     Allergies   Codeine; Gabapentin; Isoptin [verapamil]; Remeron [mirtazapine]; Trazodone and nefazodone; and Aldara [imiquimod]   Review of Systems Review of Systems  Constitutional: Negative for chills, fatigue and fever.  Gastrointestinal: Negative for nausea and vomiting.  Musculoskeletal: Negative for back pain.  Skin: Positive for rash. Negative for wound.  All other systems reviewed and are negative.    Physical Exam Updated Vital Signs BP 153/61 (BP Location: Right Arm)   Pulse 98   Temp 98.6 F (37 C) (Oral)   Resp 20   Ht 5' 1.5" (1.562 m)   Wt 145 lb (65.8 kg)   SpO2 94%   BMI 26.95 kg/m   Physical Exam  Constitutional: She appears well-developed and well-nourished. No distress.  HENT:  Head: Normocephalic and atraumatic.  Eyes: Conjunctivae are normal.  Neck: Neck supple.  Cardiovascular: Normal rate and regular rhythm.   No murmur heard. Pulmonary/Chest: Effort normal and breath sounds normal. No respiratory distress.  Abdominal: Soft. There is no tenderness. There is no guarding.  Musculoskeletal: She exhibits no edema.  Neurological: She is alert.  Skin: Skin is warm and dry. Rash (to the right side of her face that is not warm, indurated, tender or significantly edematous.) noted.  Psychiatric: She has a normal mood and affect.  Nursing note and vitals reviewed.    ED Treatments / Results  Labs (all labs ordered are listed, but only abnormal results are displayed) Labs  Reviewed - No data to display  EKG  EKG Interpretation None       Radiology No results found.  Procedures Procedures (including critical care time)  Medications Ordered in ED Medications  predniSONE (DELTASONE) tablet 60 mg (not administered)     Initial Impression / Assessment and Plan / ED Course  I have reviewed the triage vital signs and the nursing notes.  Pertinent labs & imaging results that were available during my care of the patient were reviewed by me and considered in my medical decision making (see chart for details).  Clinical Course    Suspect some type of dermatitis rather than cellulitis on this presentation. Patient is afebrile there is no tenderness. No evidence of systemic  infection. Plan to try a course of steroids and follow-up with dermatology. She will follow with her primary doctor tomorrow otherwise will return here for any new or worsening symptoms in the meantime.  Final Clinical Impressions(s) / ED Diagnoses   Final diagnoses:  Dermatitis    New Prescriptions New Prescriptions   PREDNISONE (DELTASONE) 20 MG TABLET    3 tabs po daily x 3 days, then 2 tabs x 3 days, then 1.5 tabs x 3 days, then 1 tab x 3 days, then 0.5 tabs x 3 days     Merrily Pew, MD 08/15/15 [phone]

## 2015-08-15 NOTE — ED Triage Notes (Signed)
Pt currently lives in independent living at [facility].

## 2015-08-15 NOTE — ED Triage Notes (Signed)
Pt reports radiation last fall to R neck for skin cancer. Pt has had issues with cellulitis on R face since early June. Was last treated this past Thursday with Rocephin and doxycycline (finished course this am). Denies fever, pain, n/v/d. Pt reports this morning the R side of her face has begun to redden.

## 2016-07-14 ENCOUNTER — Encounter: Payer: Self-pay | Admitting: Radiation Oncology

## 2016-07-19 NOTE — Progress Notes (Signed)
Head and Neck Cancer Location of Tumor / Histology: N/A  Patient presented  months ago with symptoms of: The area to her Right Neck becomes red and swollen at times over the past year. The redness will sometimes become larger and spread to her chest and up to her face. She denies pain to this area. She tells me that she has increased body aches when the redness appears. She is currently on prednisone 2.5 mg daily. She does feel better when she is prescribed prednisone at the clinic at [facility].   Biopsies of  revealed: Not done  Nutrition Status Yes No Comments  Weight changes? []  [x]    Swallowing concerns? []  [x]    PEG? []  [x]     Referrals Yes No Comments  Social Work? []  [x]    Dentistry? []  [x]    Swallowing therapy? []  [x]    Nutrition? []  [x]    Med/Onc? []  [x]     Safety Issues Yes No Comments  Prior radiation? [x]  []  TREATMENT DATES: 11/19/2014 through 12/30/2014                                                     SITE/DOSE:  Upper right neck 5000 cGy in 25 sessions, reduced field right upper neck 600 cGy in 3 sessions  by Dr. Valere Dross    Pacemaker/ICD? []  [x]    Possible current pregnancy? []  [x]    Is the patient on methotrexate? []  [x]     Tobacco/Marijuana/Snuff/ETOH use: Never a smoker, no alcohol use.    Past/Anticipated interventions by otolaryngology, if any:  N/A  Past/Anticipated interventions by medical oncology, if any: N/A   Current Complaints / other details:    BP 124/62   Pulse 76   Temp 98.5 F (36.9 C)   Wt 154 lb 3.2 oz (69.9 kg)   SpO2 92% Comment: room air  BMI 28.66 kg/m    Wt Readings from Last 3 Encounters:  07/25/16 154 lb 3.2 oz (69.9 kg)  08/15/15 145 lb (65.8 kg)  05/28/15 133 lb 3 oz (60.4 kg)

## 2016-07-25 ENCOUNTER — Ambulatory Visit
Admission: RE | Admit: 2016-07-25 | Discharge: 2016-07-25 | Disposition: A | Discharge status: Home / Self Care | Payer: Medicare Other | Source: Ambulatory Visit | Attending: Radiation Oncology | Admitting: Radiation Oncology

## 2016-07-25 ENCOUNTER — Encounter: Payer: Self-pay | Admitting: Radiation Oncology

## 2016-07-25 ENCOUNTER — Telehealth: Payer: Self-pay | Admitting: *Deleted

## 2016-07-25 DIAGNOSIS — C4442 Squamous cell carcinoma of skin of scalp and neck: Secondary | ICD-10-CM

## 2016-07-25 LAB — BUN AND CREATININE (CC13)
BUN: 14.1 mg/dL (ref 7.0–26.0)
Creatinine: 0.9 mg/dL (ref 0.6–1.1)
EGFR: 53 mL/min/{1.73_m2} — ABNORMAL LOW (ref >90)

## 2016-07-25 NOTE — Progress Notes (Signed)
Radiation Oncology         [phone] ________________________________  Initial Outpatient Consultation  Name: Jamie Brown MRN: [id number]  Date: 07/25/2016  DOB: 1924-05-04  VO:[id number], Christian Mate, MD  Javier Glazier, MD   REFERRING PHYSICIAN: Javier Glazier, MD  DIAGNOSIS:    ICD-10-CM   1. 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 female who presented with   right sided erythema across the neck with a swollen lump; this was intermittent over the last year. These episodes occur every 4-6 weeks. The patient has a history of cancer and post op electron radiation treatment to the right neck with Dr Valere Dross.   The patient has not undergone recent biopsy of the right neck. She does not have any recent imaging in our system but daughter states a recent neck US was negative.  The patient presents to the clinic today to discuss the role that radiation may play in the treatment of her disease. She is accompanied by her daughter.  On review of systems, the patient denies pain to her neck. She reports that at times her neck will become red and swollen, and this will spread to her chest and face; this is centered around the prior radiation sites. When this happens, she experiences increased body aches, as well as localized itching. The patient's daughter reports that she has not had an "episode" of this spreading to her face and chest in the past 8 months. The patient has seen her dermatologist about this issue. The patient is on Prednisone 2.5 mg daily, and reports this helps her, though she had an episode of this recently despite prednisone dose. She also uses cortisone cream prn. The patient is taking simvastatin. The patient denies weight changes or nutrition concerns. She denies difficulty  swallowing.   The patient's daughter reviews images of the patient's treatment plan today, and confirms that the skin redness originates from the site of the prior surgery and radiation, though it may spread out of the bounds of the radiation fields. She reports the patient had a recent US which was negative, however this imaging is not in our system for review.    PREVIOUS RADIATION THERAPY: Yes  11/19/14 - 12/30/14 : Upper Right Neck treated to 56 Gy in 28 fractions by Dr. Valere Dross. The patient stopped early due to skinside effects and didn't reach 60Gy as planned.  PAST MEDICAL HISTORY:  has a past medical history of Coronary artery disease; High cholesterol; Hypertension; Radiation (11/19/14-12/30/14); and Squamous cell carcinoma.    PAST SURGICAL HISTORY: Past Surgical History:  Procedure Laterality Date  . BACK SURGERY    . CESAREAN SECTION    . ROTATOR CUFF REPAIR Bilateral     FAMILY HISTORY: family history is not on file.  SOCIAL HISTORY:  reports that she has never smoked. She has never used smokeless tobacco. She reports that she does not drink alcohol or use drugs. Patient resides at [facility].  ALLERGIES: Codeine; Gabapentin; Isoptin [verapamil]; Remeron [mirtazapine]; Trazodone and nefazodone; and Aldara [imiquimod]  MEDICATIONS:  Current Outpatient Prescriptions  Medication Sig Dispense Refill  . aspirin EC 81 MG tablet Take 81 mg by mouth daily.    . cetirizine (ZYRTEC) 10 MG tablet Take 10 mg by  mouth daily.    . Cholecalciferol (VITAMIN D) 2000 units tablet Take 2,000 Units by mouth at bedtime.    Marland Kitchen CRANBERRY EXTRACT PO Take 25,000 mg by mouth at bedtime.     . docusate sodium (STOOL SOFTENER) 100 MG capsule Take 100 mg by mouth daily as needed for mild constipation.    Marland Kitchen glycerin, Pediatric, 1.2 g SUPP Place 1 suppository rectally daily as needed for moderate constipation.    Marland Kitchen LORazepam (ATIVAN) 0.5 MG tablet Take 0.5 mg by mouth at bedtime. Usually  takes between 12am and 2pm when she gets up to use restroom    . NIFEdipine (PROCARDIA XL/ADALAT-CC) 60 MG 24 hr tablet Take 60 mg by mouth daily.    . Omega-3 Fatty Acids (FISH OIL) 1200 MG CAPS Take 1,200 mg by mouth at bedtime.    . polyethylene glycol (MIRALAX / GLYCOLAX) packet Take 17 g by mouth every evening.    . potassium chloride (K-DUR,KLOR-CON) 10 MEQ tablet Take 10 mEq by mouth daily with breakfast.    . predniSONE (DELTASONE) 2.5 MG tablet Take 2.5 mg by mouth daily with breakfast.    . psyllium (METAMUCIL) 58.6 % powder Take 1 packet by mouth daily with breakfast.    . sertraline (ZOLOFT) 50 MG tablet Take 50 mg by mouth daily with breakfast.    . simvastatin (ZOCOR) 40 MG tablet Take 40 mg by mouth at bedtime.     . Tafluprost (ZIOPTAN) 0.0015 % SOLN Place 1 drop into both eyes at bedtime.    . torsemide (DEMADEX) 10 MG tablet Take 5 mg by mouth See admin instructions. Takes on Tuesday, Thursday, Saturday, and Sunday    . traMADol (ULTRAM) 50 MG tablet Take 100 mg by mouth 2 (two) times daily.     Marland Kitchen zolpidem (AMBIEN) 10 MG tablet Take 10 mg by mouth at bedtime.    Marland Kitchen ibuprofen (ADVIL,MOTRIN) 200 MG tablet Take 600 mg by mouth every 6 (six) hours as needed for fever, headache, mild pain, moderate pain or cramping.     No current facility-administered medications for this encounter.      PHYSICAL EXAM:  weight is 154 lb 3.2 oz (69.9 kg). Her temperature is 98.5 F (36.9 C). Her blood pressure is 124/62 and her pulse is 76. Her oxygen saturation is 92%.   General: Alert and oriented, in no acute distress. HEENT: Head is normocephalic. Oropharynx and oral cavity are clear. Neck: Right now I am not seeing any obvious rashes or dermatitis over her neck.  She has some very slight erythema that seems to mimic the radiation field over the right neck. She has a seroma-like area of swelling at the inferior aspect of the right neck incision at level III, approximately 2 cm in greatest  dimension. No clear lymphadenopathy in the cervical or supraclavicular regions. No lymphadenopathy in the pre or postauricular regions. Neurologic: No obvious focalities. Speech is fluent. Coordination is intact. Psychiatric: Judgment and insight are intact. Affect is appropriate.  ECOG = 3   LABORATORY DATA:  Lab Results  Component Value Date   WBC 7.6 05/30/2015   HGB 11.2 (L) 05/30/2015   HCT 34.2 (L) 05/30/2015   MCV 83.0 05/30/2015   PLT 315 05/30/2015   CMP     Component Value Date/Time   NA 139 06/01/2015 0357   K 3.8 06/01/2015 0357   CL 104 06/01/2015 0357   CO2 26 06/01/2015 0357   GLUCOSE 116 (H) 06/01/2015 0357   BUN  14.1 07/25/2016 1040   CREATININE 0.9 07/25/2016 1040   CALCIUM 9.0 06/01/2015 0357   PROT 6.6 05/30/2015 0351   ALBUMIN 2.9 (L) 05/30/2015 0351   AST 27 05/30/2015 0351   ALT 34 05/30/2015 0351   ALKPHOS 79 05/30/2015 0351   BILITOT 0.5 05/30/2015 0351   GFRNONAA >60 06/01/2015 0357   GFRAA >60 06/01/2015 0357    RADIOGRAPHY: No results found.    IMPRESSION/PLAN:  This is a delightful patient with a history of head and neck cancer treated with surgery and radiation in 2016.   I discussed the patient's symptoms, medications, and medical history. I discussed with her the phenomena of radiation recall. I cannot confirm that this is what is happening, however I feel holding simvastatin is warranted in order to observe the patient's reaction. She will discuss this with her PCP at the assisted living facility. I would like simvastatin to be held for at least 2-3 months. Patient will continue low dose Prednisone as directed by her PCP.  If this reaction happens again, the patient's daughter will take pictures of the patient's neck and e-mail these images to me.  Additionally, I palpated some swelling in the right neck, and will order a neck CT with contrast to assess for underlying infection or enlarged lymph nodes. I will call the patient with the  results of this study. I think this is likely benign. The patient will present for labs today following our consult to assess for IV contrast safety. I will call daughter w/ results.  Return to clinic for follow up in 3 months.  I spent 35 minutes face to face with the patient and more than 50% of that time was spent in counseling and/or coordination of care. __________________________________________   Eppie Gibson, MD  This document serves as a record of services personally performed by Eppie Gibson, MD. It was created on her behalf by Maryla Morrow, a trained medical scribe. The creation of this record is based on the scribe's personal observations and the provider's statements to them. This document has been checked and approved by the attending provider.

## 2016-07-25 NOTE — Telephone Encounter (Signed)
CALLED PATIENT TO INFORM OF CT FOR 07-28-16- ARRIVAL TIME - 4:15 PM @ [facility], PT. TO HAVE CLEAR LIQUIDS ONLY - 4 HRS. PRIOR TO TEST, DR. Isidore Moos TO CALL WITH RESULTS, PT. TO SEE DR. SQUIRE AGAIN IN October, LVM FOR A RETURN CALL

## 2016-07-28 ENCOUNTER — Ambulatory Visit (HOSPITAL_COMMUNITY)
Admission: RE | Admit: 2016-07-28 | Discharge: 2016-07-28 | Disposition: A | Discharge status: Home / Self Care | Payer: Medicare Other | Source: Ambulatory Visit | Attending: Radiation Oncology | Admitting: Radiation Oncology

## 2016-07-28 DIAGNOSIS — M4802 Spinal stenosis, cervical region: Secondary | ICD-10-CM | POA: Diagnosis not present

## 2016-07-28 DIAGNOSIS — J359 Chronic disease of tonsils and adenoids, unspecified: Secondary | ICD-10-CM | POA: Insufficient documentation

## 2016-07-28 DIAGNOSIS — Z9889 Other specified postprocedural states: Secondary | ICD-10-CM | POA: Insufficient documentation

## 2016-07-28 DIAGNOSIS — C4442 Squamous cell carcinoma of skin of scalp and neck: Secondary | ICD-10-CM

## 2016-07-28 DIAGNOSIS — I6529 Occlusion and stenosis of unspecified carotid artery: Secondary | ICD-10-CM | POA: Insufficient documentation

## 2016-07-28 MED ORDER — IOPAMIDOL (ISOVUE-300) INJECTION 61%
INTRAVENOUS | Status: AC
  Filled 2016-07-28: qty 75

## 2016-07-28 MED ORDER — IOPAMIDOL (ISOVUE-300) INJECTION 61%
75.0000 mL | Freq: Once | INTRAVENOUS | Status: AC | PRN
  Administered 2016-07-28: 75 mL via INTRAVENOUS

## 2016-08-02 ENCOUNTER — Telehealth: Payer: Self-pay | Admitting: Radiation Oncology

## 2016-08-02 NOTE — Telephone Encounter (Signed)
I spoke with Massie Kluver, patient's daughter this am, to review ENT tumor board discussion of her mom's CT scan. No sign of cancer recurrence in neck.  There is a benign appearing right tonsil lesion, possibly a cyst.  ENT doctors said it appears benign but they are happy to examine the patient out of caution. Given the challenges of traveling to appointments, compounded by her mother's other medical issues/age/ co morbidities, they will defer an ENT appt for now.  She knows to have the MDs at the patient's facility check her tonsils if she develops a sore throat, or to call me at anytime. I will see her in 3 mo.  -----------------------------------  Eppie Gibson, MD

## 2016-09-22 ENCOUNTER — Encounter (HOSPITAL_BASED_OUTPATIENT_CLINIC_OR_DEPARTMENT_OTHER): Payer: Self-pay | Admitting: *Deleted

## 2016-09-22 ENCOUNTER — Emergency Department (HOSPITAL_BASED_OUTPATIENT_CLINIC_OR_DEPARTMENT_OTHER): Payer: Medicare Other

## 2016-09-22 ENCOUNTER — Emergency Department (HOSPITAL_BASED_OUTPATIENT_CLINIC_OR_DEPARTMENT_OTHER)
Admission: EM | Admit: 2016-09-22 | Discharge: 2016-09-22 | Disposition: A | Discharge status: Rehabilitation Facility | Payer: Medicare Other | Attending: Emergency Medicine | Admitting: Emergency Medicine

## 2016-09-22 DIAGNOSIS — I10 Essential (primary) hypertension: Secondary | ICD-10-CM | POA: Diagnosis not present

## 2016-09-22 DIAGNOSIS — Z79899 Other long term (current) drug therapy: Secondary | ICD-10-CM | POA: Diagnosis not present

## 2016-09-22 DIAGNOSIS — I259 Chronic ischemic heart disease, unspecified: Secondary | ICD-10-CM | POA: Diagnosis not present

## 2016-09-22 DIAGNOSIS — W1830XA Fall on same level, unspecified, initial encounter: Secondary | ICD-10-CM | POA: Insufficient documentation

## 2016-09-22 DIAGNOSIS — Y92121 Bathroom in nursing home as the place of occurrence of the external cause: Secondary | ICD-10-CM | POA: Insufficient documentation

## 2016-09-22 DIAGNOSIS — S12100A Unspecified displaced fracture of second cervical vertebra, initial encounter for closed fracture: Secondary | ICD-10-CM

## 2016-09-22 DIAGNOSIS — Y939 Activity, unspecified: Secondary | ICD-10-CM | POA: Insufficient documentation

## 2016-09-22 DIAGNOSIS — Z85828 Personal history of other malignant neoplasm of skin: Secondary | ICD-10-CM | POA: Insufficient documentation

## 2016-09-22 DIAGNOSIS — S12112A Nondisplaced Type II dens fracture, initial encounter for closed fracture: Secondary | ICD-10-CM | POA: Insufficient documentation

## 2016-09-22 DIAGNOSIS — S0990XA Unspecified injury of head, initial encounter: Secondary | ICD-10-CM | POA: Diagnosis present

## 2016-09-22 DIAGNOSIS — Z7982 Long term (current) use of aspirin: Secondary | ICD-10-CM | POA: Diagnosis not present

## 2016-09-22 DIAGNOSIS — Y999 Unspecified external cause status: Secondary | ICD-10-CM | POA: Insufficient documentation

## 2016-09-22 LAB — CBC WITH DIFFERENTIAL/PLATELET
Basophils Absolute: 0 10*3/uL (ref 0.0–0.1)
Basophils Relative: 0 %
Eosinophils Absolute: 0 10*3/uL (ref 0.0–0.7)
Eosinophils Relative: 0 %
HCT: 41 % (ref 36.0–46.0)
Hemoglobin: 13.5 g/dL (ref 12.0–15.0)
Lymphocytes Relative: 12 %
Lymphs Abs: 1.5 10*3/uL (ref 0.7–4.0)
MCH: 27.3 pg (ref 26.0–34.0)
MCHC: 32.9 g/dL (ref 30.0–36.0)
MCV: 83 fL (ref 78.0–100.0)
Monocytes Absolute: 1 10*3/uL (ref 0.1–1.0)
Monocytes Relative: 8 %
Neutro Abs: 10.2 10*3/uL — ABNORMAL HIGH (ref 1.7–7.7)
Neutrophils Relative %: 80 %
Platelets: 329 10*3/uL (ref 150–400)
RBC: 4.94 MIL/uL (ref 3.87–5.11)
RDW: 15.2 % (ref 11.5–15.5)
WBC: 12.7 10*3/uL — ABNORMAL HIGH (ref 4.0–10.5)

## 2016-09-22 LAB — BASIC METABOLIC PANEL
Anion gap: 11 (ref 5–15)
BUN: 18 mg/dL (ref 6–20)
CO2: 30 mmol/L (ref 22–32)
Calcium: 9.4 mg/dL (ref 8.9–10.3)
Chloride: 96 mmol/L — ABNORMAL LOW (ref 101–111)
Creatinine, Ser: 0.98 mg/dL (ref 0.44–1.00)
GFR calc Af Amer: 58 mL/min — ABNORMAL LOW (ref >60)
GFR calc non Af Amer: 50 mL/min — ABNORMAL LOW (ref >60)
Glucose, Bld: 131 mg/dL — ABNORMAL HIGH (ref 65–99)
Potassium: 3.6 mmol/L (ref 3.5–5.1)
Sodium: 137 mmol/L (ref 135–145)

## 2016-09-22 LAB — URINALYSIS, ROUTINE W REFLEX MICROSCOPIC
Bilirubin Urine: NEGATIVE
Glucose, UA: NEGATIVE mg/dL
Ketones, ur: NEGATIVE mg/dL
Nitrite: NEGATIVE
Protein, ur: NEGATIVE mg/dL
Specific Gravity, Urine: 1.01 (ref 1.005–1.030)
pH: 7 (ref 5.0–8.0)

## 2016-09-22 LAB — URINALYSIS, MICROSCOPIC (REFLEX)

## 2016-09-22 MED ORDER — OXYCODONE-ACETAMINOPHEN 5-325 MG PO TABS: 1.0000 | ORAL_TABLET | ORAL | 0 refills | Status: DC | PRN

## 2016-09-22 MED ORDER — OXYCODONE-ACETAMINOPHEN 5-325 MG PO TABS
1.0000 | ORAL_TABLET | Freq: Once | ORAL | Status: AC
  Administered 2016-09-22: 1 via ORAL
  Filled 2016-09-22: qty 1

## 2016-09-22 NOTE — ED Notes (Signed)
Spoke with Candace at [facility] regarding pt placement in rehab.

## 2016-09-22 NOTE — ED Triage Notes (Signed)
Pt a resident at [facility]. Reports that she woke up on the bathroom floor-unsure how long she was there. Pt took 1 tramadol this morning PTA. Pt went to the clinic at [facility], she had a C-collar placed and a steri strip on her right forehead due to a small laceration.  Pt denies feeling dizzy last night prior to falling, denies tripping. Pts daughter reports that pt has had increased difficulty finding words, denies slurred speech.

## 2016-09-22 NOTE — NC FL2 (Signed)
[place] LEVEL OF CARE SCREENING TOOL     IDENTIFICATION  Patient Name: Jamie Brown Birthdate: May 23, 1924 Sex: female Admission Date (Current Location): 09/22/2016  [facility] and [place] Number:  Herbalist and Address:   Psychologist, clinical)      Provider Number: [id number]  Attending Physician Name and Address:  Virgel Manifold, MD  Relative Name and Phone Number:       Current Level of Care: Hospital Recommended Level of Care: [place] Prior Approval Number:    Date Approved/Denied:   PASRR Number: [id number]  Discharge Plan: SNF    Current Diagnoses: Patient Active Problem List   Diagnosis Date Noted  . Sepsis ([place]) 05/28/2015  . UTI (lower urinary tract infection) 05/28/2015  . HLD (hyperlipidemia) 05/28/2015  . HTN (hypertension) 05/28/2015  . Lactic acidosis 05/28/2015  . Insomnia 05/28/2015  . Squamous cell carcinoma of skin of neck 10/29/2014    Orientation RESPIRATION BLADDER Height & Weight     Self, Time, Place, Situation  Normal  (Unknown) Weight: 154 lb (69.9 kg) Height:  5\' 1"  (154.9 cm)  BEHAVIORAL SYMPTOMS/MOOD NEUROLOGICAL BOWEL NUTRITION STATUS       (Unknown)    AMBULATORY STATUS COMMUNICATION OF NEEDS Skin   Limited Assist Verbally  (Unknown)                       Personal Care Assistance Level of Assistance  Bathing, Dressing Bathing Assistance: Limited assistance   Dressing Assistance: Limited assistance     Functional Limitations Info             SPECIAL CARE FACTORS FREQUENCY  PT (By licensed PT), OT (By licensed OT)     PT Frequency: 5x/wk OT Frequency: 5x/wk            Contractures      Additional Factors Info  Code Status, Allergies Code Status Info: full Allergies Info: Codeine, Gabapentin, Isoptin Verapamil, Remeron Mirtazapine, Trazodone And Nefazodone, Aldara Imiquimod           Current Medications (09/22/2016):  This is the current hospital active  medication list No current facility-administered medications for this encounter.    Current Outpatient Prescriptions  Medication Sig Dispense Refill  . aspirin EC 81 MG tablet Take 81 mg by mouth daily.    . cetirizine (ZYRTEC) 10 MG tablet Take 10 mg by mouth daily.    . Cholecalciferol (VITAMIN D) 2000 units tablet Take 2,000 Units by mouth at bedtime.    Marland Kitchen CRANBERRY EXTRACT PO Take 25,000 mg by mouth at bedtime.     . docusate sodium (STOOL SOFTENER) 100 MG capsule Take 100 mg by mouth daily as needed for mild constipation.    Marland Kitchen glycerin, Pediatric, 1.2 g SUPP Place 1 suppository rectally daily as needed for moderate constipation.    Marland Kitchen ibuprofen (ADVIL,MOTRIN) 200 MG tablet Take 600 mg by mouth every 6 (six) hours as needed for fever, headache, mild pain, moderate pain or cramping.    Marland Kitchen LORazepam (ATIVAN) 0.5 MG tablet Take 0.5 mg by mouth at bedtime. Usually takes between 12am and 2pm when she gets up to use restroom    . NIFEdipine (PROCARDIA XL/ADALAT-CC) 60 MG 24 hr tablet Take 60 mg by mouth daily.    . Omega-3 Fatty Acids (FISH OIL) 1200 MG CAPS Take 1,200 mg by mouth at bedtime.    . polyethylene glycol (MIRALAX / GLYCOLAX) packet Take 17 g by mouth every  evening.    . potassium chloride (K-DUR,KLOR-CON) 10 MEQ tablet Take 10 mEq by mouth daily with breakfast.    . predniSONE (DELTASONE) 2.5 MG tablet Take 2.5 mg by mouth daily with breakfast.    . psyllium (METAMUCIL) 58.6 % powder Take 1 packet by mouth daily with breakfast.    . sertraline (ZOLOFT) 50 MG tablet Take 50 mg by mouth daily with breakfast.    . Tafluprost (ZIOPTAN) 0.0015 % SOLN Place 1 drop into both eyes at bedtime.    . torsemide (DEMADEX) 10 MG tablet Take 5 mg by mouth See admin instructions. Takes on Tuesday, Thursday, Saturday, and Sunday    . traMADol (ULTRAM) 50 MG tablet Take 100 mg by mouth 2 (two) times daily.     Marland Kitchen zolpidem (AMBIEN) 10 MG tablet Take 10 mg by mouth at bedtime.       Discharge  Medications: Please see discharge summary for a list of discharge medications.  Relevant Imaging Results:  Relevant Lab Results:   Additional Information SS#: [id number]  Geralynn Ochs, LCSW

## 2016-09-22 NOTE — ED Notes (Signed)
Pt in wheelchair and neck brace(from home).

## 2016-09-22 NOTE — ED Notes (Signed)
Pt on cardiac monitor and auto VS 

## 2016-09-22 NOTE — Discharge Instructions (Signed)
You have a fracture of yours dens which is a bone high in your cervical spine (neck). The treatment of this is to wear a cervical collar at all times. Dr Ronnald Ramp, neurosurgery, wants to see you in his office in 4-6 weeks and you'll have repeat imaging at that time. This is generally not a surgical injury.

## 2016-10-13 NOTE — ED Provider Notes (Signed)
[facility] DEPT Provider Note   CSN: [id number] Arrival date & time: 09/22/16  1023     History   Chief Complaint Chief Complaint  Patient presents with  . Fall    HPI Jamie Brown is a 83 y.o. female.  HPI   83 year old presenting after a fall. Got accused bathroom and lost balance and fell." Couple of bathroom floor. Patient had been there for several hours but is not exactly sure. Small laceration to her right forehead which is Steri-Stripped prior to arrival. Denies any acute pain.  Past Medical History:  Diagnosis Date  . Coronary artery disease   . High cholesterol   . Hypertension   . Radiation 11/19/14-12/30/14   upper right neck 5000 cGy  . Squamous cell carcinoma     Patient Active Problem List   Diagnosis Date Noted  . Sepsis ([place]) 05/28/2015  . UTI (lower urinary tract infection) 05/28/2015  . HLD (hyperlipidemia) 05/28/2015  . HTN (hypertension) 05/28/2015  . Lactic acidosis 05/28/2015  . Insomnia 05/28/2015  . 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 BACK SURGERY    . CESAREAN SECTION    . ROTATOR CUFF REPAIR Bilateral     OB History    No data available       Home Medications    Prior to Admission medications   Medication Sig Start Date End Date Taking? Authorizing Provider  aspirin EC 81 MG tablet Take 81 mg by mouth daily.    [provider]  cetirizine (ZYRTEC) 10 MG tablet Take 10 mg by mouth daily.    [provider]  Cholecalciferol (VITAMIN D) 2000 units tablet Take 2,000 Units by mouth at bedtime.    [provider]  CRANBERRY EXTRACT PO Take 25,000 mg by mouth at bedtime.     [provider]  docusate sodium (STOOL SOFTENER) 100 MG capsule Take 100 mg by mouth daily as needed for mild constipation.    [provider]  glycerin, Pediatric, 1.2 g SUPP Place 1 suppository rectally daily as needed for moderate  constipation.    [provider]  ibuprofen (ADVIL,MOTRIN) 200 MG tablet Take 600 mg by mouth every 6 (six) hours as needed for fever, headache, mild pain, moderate pain or cramping.    [provider]  LORazepam (ATIVAN) 0.5 MG tablet Take 0.5 mg by mouth at bedtime. Usually takes between 12am and 2pm when she gets up to use restroom    [provider]  NIFEdipine (PROCARDIA XL/ADALAT-CC) 60 MG 24 hr tablet Take 60 mg by mouth daily.    [provider]  Omega-3 Fatty Acids (FISH OIL) 1200 MG CAPS Take 1,200 mg by mouth at bedtime.    [provider]  oxyCODONE-acetaminophen (PERCOCET/ROXICET) 5-325 MG tablet Take 1 tablet by mouth every 4 (four) hours as needed for severe pain. 09/22/16   Virgel Manifold, MD  polyethylene glycol [facility] / Floria Raveling) packet Take 17 g by mouth every evening.    [provider]  potassium chloride (K-DUR,KLOR-CON) 10 MEQ tablet Take 10 mEq by mouth daily with breakfast.    [provider]  predniSONE (DELTASONE) 2.5 MG tablet Take 2.5 mg by mouth daily with breakfast.    [provider]  psyllium (METAMUCIL) 58.6 % powder Take 1 packet by mouth daily with breakfast.    [provider]  sertraline (ZOLOFT) 50 MG tablet Take 50 mg by mouth  daily with breakfast.    [provider]  Tafluprost (ZIOPTAN) 0.0015 % SOLN Place 1 drop into both eyes at bedtime.    [provider]  torsemide (DEMADEX) 10 MG tablet Take 5 mg by mouth See admin instructions. Takes on Tuesday, Thursday, Saturday, and Sunday    [provider]  traMADol (ULTRAM) 50 MG tablet Take 100 mg by mouth 2 (two) times daily.     [provider]  zolpidem (AMBIEN) 10 MG tablet Take 10 mg by mouth at bedtime.    [provider]    Family History History reviewed. No pertinent family history.  Social History Social History  Substance Use Topics  . Smoking status: Never Smoker  .  Smokeless tobacco: Never Used  . Alcohol use No     Allergies   Codeine; Gabapentin; Isoptin [verapamil]; Remeron [mirtazapine]; Trazodone and nefazodone; and Aldara [imiquimod]   Review of Systems Review of Systems  All systems reviewed and negative, other than as noted in HPI.  Physical Exam Updated Vital Signs BP (!) 127/58   Pulse 86   Temp 98.7 F (37.1 C) (Oral)   Resp 13   Ht 5\' 1"  (1.549 m)   Wt 69.9 kg (154 lb)   SpO2 94%   BMI 29.10 kg/m   Physical Exam  Constitutional: She appears well-developed and well-nourished. No distress.  HENT:  Head: Normocephalic.  Small laceration to right forehead  Eyes: Conjunctivae are normal. Right eye exhibits no discharge. Left eye exhibits no discharge.  Neck: Neck supple.  Cardiovascular: Normal rate, regular rhythm and normal heart sounds.  Exam reveals no gallop and no friction rub.   No murmur heard. Pulmonary/Chest: Effort normal and breath sounds normal. No respiratory distress.  Abdominal: Soft. She exhibits no distension. There is no tenderness.  Musculoskeletal: She exhibits no edema or tenderness.  Upper midline cervical tenderness. Redness and tenderness to right little toe.  Neurological: She is alert. No cranial nerve deficit. She exhibits normal muscle tone. Coordination normal.  Skin: Skin is warm and dry.  Psychiatric: She has a normal mood and affect. Her behavior is normal.  Nursing note and vitals reviewed.     ED Treatments / Results  Labs (all labs ordered are listed, but only abnormal results are displayed) Labs Reviewed  URINALYSIS, ROUTINE W REFLEX MICROSCOPIC - Abnormal; Notable for the following:       Result Value   Hgb urine dipstick TRACE (*)    Leukocytes, UA SMALL (*)    All other components within normal limits  BASIC METABOLIC PANEL - Abnormal; Notable for the following:    Chloride 96 (*)    Glucose, Bld 131 (*)    GFR calc non Af Amer 50 (*)    GFR calc Af Amer 58 (*)    All  other components within normal limits  CBC WITH DIFFERENTIAL/PLATELET - Abnormal; Notable for the following:    WBC 12.7 (*)    Neutro Abs 10.2 (*)    All other components within normal limits  URINALYSIS, MICROSCOPIC (REFLEX) - Abnormal; Notable for the following:    Bacteria, UA RARE (*)    Squamous Epithelial / LPF 0-5 (*)    All other components within normal limits    EKG  EKG Interpretation  Date/Time:  Friday September 22 2016 12:15:06 EDT Ventricular Rate:  92 PR Interval:    QRS Duration: 110 QT Interval:  368 QTC Calculation: 456 R Axis:   81 Text Interpretation:  Sinus rhythm Prolonged PR interval Borderline right axis deviation Low voltage, precordial leads No significant change was found Confirmed by Jola Schmidt [phone]) on 09/23/2016 12:42:57 PM       Radiology No results found. Ct Head Wo Contrast  Result Date: 09/22/2016 CLINICAL DATA:  Minor head trauma, found down on bathroom floor, neck pain, headache EXAM: CT HEAD WITHOUT CONTRAST CT CERVICAL SPINE WITHOUT CONTRAST TECHNIQUE: Multidetector CT imaging of the head and cervical spine was performed following the standard protocol without intravenous contrast. Multiplanar CT image reconstructions of the cervical spine were also generated. COMPARISON:  Cough FINDINGS: CT HEAD FINDINGS Brain: No evidence of acute infarction, hemorrhage, hydrocephalus, extra-axial collection or mass lesion/mass effect. Subcortical white matter and periventricular small vessel ischemic changes. Mild cortical atrophy. Vascular: Intracranial atherosclerosis. Skull: Normal. Negative for fracture or focal lesion. Sinuses/Orbits: The visualized paranasal sinuses are essentially clear. The mastoid air cells are unopacified. Other: None. CT CERVICAL SPINE FINDINGS Alignment: Straightening of the cervical spine. Skull base and vertebrae: Type 2 dens fracture (series 9/image 38), nondisplaced. Soft tissues and spinal canal: No prevertebral fluid or  swelling. No visible canal hematoma. Disc levels:  Moderate multilevel degenerative changes. Spinal canal is patent. Upper chest: Visualized lung apices are clear. Other: Visualized thyroid is unremarkable. IMPRESSION: Type 2 dens fracture, nondisplaced. No evidence of acute intracranial abnormality. Atrophy with small vessel ischemic changes. These results were called by telephone at the time of interpretation on 09/22/2016 at 12:38 pm to Dr. Virgel Manifold , who verbally acknowledged these results. Electronically Signed   By: Julian Hy M.D.   On: 09/22/2016 12:38   Ct Cervical Spine Wo Contrast  Result Date: 09/22/2016 CLINICAL DATA:  Minor head trauma, found down on bathroom floor, neck pain, headache EXAM: CT HEAD WITHOUT CONTRAST CT CERVICAL SPINE WITHOUT CONTRAST TECHNIQUE: Multidetector CT imaging of the head and cervical spine was performed following the standard protocol without intravenous contrast. Multiplanar CT image reconstructions of the cervical spine were also generated. COMPARISON:  Cough FINDINGS: CT HEAD FINDINGS Brain: No evidence of acute infarction, hemorrhage, hydrocephalus, extra-axial collection or mass lesion/mass effect. Subcortical white matter and periventricular small vessel ischemic changes. Mild cortical atrophy. Vascular: Intracranial atherosclerosis. Skull: Normal. Negative for fracture or focal lesion. Sinuses/Orbits: The visualized paranasal sinuses are essentially clear. The mastoid air cells are unopacified. Other: None. CT CERVICAL SPINE FINDINGS Alignment: Straightening of the cervical spine. Skull base and vertebrae: Type 2 dens fracture (series 9/image 38), nondisplaced. Soft tissues and spinal canal: No prevertebral fluid or swelling. No visible canal hematoma. Disc levels:  Moderate multilevel degenerative changes. Spinal canal is patent. Upper chest: Visualized lung apices are clear. Other: Visualized thyroid is unremarkable. IMPRESSION: Type 2 dens fracture,  nondisplaced. No evidence of acute intracranial abnormality. Atrophy with small vessel ischemic changes. These results were called by telephone at the time of interpretation on 09/22/2016 at 12:38 pm to Dr. Virgel Manifold , who verbally acknowledged these results. Electronically Signed   By: Julian Hy M.D.   On: 09/22/2016 12:38   Dg Toe 5th Right  Result Date: 09/22/2016 CLINICAL DATA:  Fall.  Right fifth toe redness. EXAM: RIGHT FIFTH TOE COMPARISON:  No prior. FINDINGS: Partial fusion of the middle and distal phalanx appears to be present. A fracture of the distal phalanx of the right fifth digit cannot be excluded.No bony erosion. No radiopaque foreign body. IMPRESSION: Partial fusion of the mid and distal phalanx appears be present. Fracture of the distal phalanx of the right  fifth digit cannot be excluded. Electronically Signed   By: Marcello Moores  Register   On: 09/22/2016 12:21   Procedures Procedures (including critical care time)  Medications Ordered in ED Medications  oxyCODONE-acetaminophen (PERCOCET/ROXICET) 5-325 MG per tablet 1 tablet (1 tablet Oral Given 09/22/16 1140)     Initial Impression / Assessment and Plan / ED Course  I have reviewed the triage vital signs and the nursing notes.  Pertinent labs & imaging results that were available during my care of the patient were reviewed by me and considered in my medical decision making (see chart for details).     83 year old female presenting after fall. Closed odontoid fracture. Baseline neurologic function. Collar at all times. NS follow-up. As needed pain medication.  Final Clinical Impressions(s) / ED Diagnoses   Final diagnoses:  Closed odontoid fracture, initial encounter [facility])    New Prescriptions Discharge Medication List as of 09/22/2016  3:27 PM    START taking these medications   Details  oxyCODONE-acetaminophen (PERCOCET/ROXICET) 5-325 MG tablet Take 1 tablet by mouth every 4 (four) hours as needed for severe  pain., Starting Fri 09/22/2016, Print         Virgel Manifold, MD 10/13/16 [phone]

## 2016-10-22 ENCOUNTER — Inpatient Hospital Stay (HOSPITAL_COMMUNITY)
Admission: EM | Admit: 2016-10-22 | Discharge: 2016-10-26 | DRG: 378 | Disposition: A | Discharge status: Home / Self Care | Payer: Medicare Other | Attending: Internal Medicine | Admitting: Internal Medicine

## 2016-10-22 ENCOUNTER — Encounter (HOSPITAL_COMMUNITY): Payer: Self-pay | Admitting: Emergency Medicine

## 2016-10-22 DIAGNOSIS — Z923 Personal history of irradiation: Secondary | ICD-10-CM | POA: Diagnosis not present

## 2016-10-22 DIAGNOSIS — D62 Acute posthemorrhagic anemia: Secondary | ICD-10-CM | POA: Diagnosis present

## 2016-10-22 DIAGNOSIS — S12110D Anterior displaced Type II dens fracture, subsequent encounter for fracture with routine healing: Secondary | ICD-10-CM

## 2016-10-22 DIAGNOSIS — I10 Essential (primary) hypertension: Secondary | ICD-10-CM | POA: Diagnosis present

## 2016-10-22 DIAGNOSIS — D649 Anemia, unspecified: Secondary | ICD-10-CM

## 2016-10-22 DIAGNOSIS — E7800 Pure hypercholesterolemia, unspecified: Secondary | ICD-10-CM | POA: Diagnosis present

## 2016-10-22 DIAGNOSIS — B9620 Unspecified Escherichia coli [E. coli] as the cause of diseases classified elsewhere: Secondary | ICD-10-CM | POA: Diagnosis present

## 2016-10-22 DIAGNOSIS — K922 Gastrointestinal hemorrhage, unspecified: Secondary | ICD-10-CM | POA: Diagnosis not present

## 2016-10-22 DIAGNOSIS — M549 Dorsalgia, unspecified: Secondary | ICD-10-CM | POA: Diagnosis present

## 2016-10-22 DIAGNOSIS — Z85828 Personal history of other malignant neoplasm of skin: Secondary | ICD-10-CM | POA: Diagnosis not present

## 2016-10-22 DIAGNOSIS — Z7982 Long term (current) use of aspirin: Secondary | ICD-10-CM

## 2016-10-22 DIAGNOSIS — E876 Hypokalemia: Secondary | ICD-10-CM | POA: Diagnosis present

## 2016-10-22 DIAGNOSIS — Z23 Encounter for immunization: Secondary | ICD-10-CM | POA: Diagnosis not present

## 2016-10-22 DIAGNOSIS — Z9181 History of falling: Secondary | ICD-10-CM | POA: Diagnosis not present

## 2016-10-22 DIAGNOSIS — K921 Melena: Secondary | ICD-10-CM | POA: Diagnosis present

## 2016-10-22 DIAGNOSIS — F418 Other specified anxiety disorders: Secondary | ICD-10-CM | POA: Diagnosis present

## 2016-10-22 DIAGNOSIS — Z888 Allergy status to other drugs, medicaments and biological substances status: Secondary | ICD-10-CM | POA: Diagnosis not present

## 2016-10-22 DIAGNOSIS — C4442 Squamous cell carcinoma of skin of scalp and neck: Secondary | ICD-10-CM | POA: Diagnosis present

## 2016-10-22 DIAGNOSIS — Z79899 Other long term (current) drug therapy: Secondary | ICD-10-CM | POA: Diagnosis not present

## 2016-10-22 DIAGNOSIS — G4700 Insomnia, unspecified: Secondary | ICD-10-CM | POA: Diagnosis present

## 2016-10-22 DIAGNOSIS — I2510 Atherosclerotic heart disease of native coronary artery without angina pectoris: Secondary | ICD-10-CM | POA: Diagnosis present

## 2016-10-22 DIAGNOSIS — Z7952 Long term (current) use of systemic steroids: Secondary | ICD-10-CM

## 2016-10-22 DIAGNOSIS — S12100D Unspecified displaced fracture of second cervical vertebra, subsequent encounter for fracture with routine healing: Secondary | ICD-10-CM | POA: Diagnosis not present

## 2016-10-22 DIAGNOSIS — Z885 Allergy status to narcotic agent status: Secondary | ICD-10-CM

## 2016-10-22 DIAGNOSIS — S12110A Anterior displaced Type II dens fracture, initial encounter for closed fracture: Secondary | ICD-10-CM | POA: Diagnosis present

## 2016-10-22 DIAGNOSIS — N390 Urinary tract infection, site not specified: Secondary | ICD-10-CM | POA: Diagnosis present

## 2016-10-22 DIAGNOSIS — G8929 Other chronic pain: Secondary | ICD-10-CM | POA: Diagnosis present

## 2016-10-22 HISTORY — DX: Gastrointestinal hemorrhage, unspecified: K92.2

## 2016-10-22 HISTORY — DX: Unspecified osteoarthritis, unspecified site: M19.90

## 2016-10-22 HISTORY — DX: Essential (primary) hypertension: I10

## 2016-10-22 LAB — COMPREHENSIVE METABOLIC PANEL
ALBUMIN: 3.4 g/dL — AB (ref 3.5–5.0)
ALT: 18 U/L (ref 14–54)
ANION GAP: 12 (ref 5–15)
AST: 24 U/L (ref 15–41)
Alkaline Phosphatase: 64 U/L (ref 38–126)
BILIRUBIN TOTAL: 0.4 mg/dL (ref 0.3–1.2)
BUN: 18 mg/dL (ref 6–20)
CO2: 26 mmol/L (ref 22–32)
Calcium: 8.8 mg/dL — ABNORMAL LOW (ref 8.9–10.3)
Chloride: 98 mmol/L — ABNORMAL LOW (ref 101–111)
Creatinine, Ser: 0.88 mg/dL (ref 0.44–1.00)
GFR calc Af Amer: >60 mL/min (ref >60)
GFR, EST NON AFRICAN AMERICAN: 56 mL/min — AB (ref >60)
Glucose, Bld: 124 mg/dL — ABNORMAL HIGH (ref 65–99)
POTASSIUM: 3.3 mmol/L — AB (ref 3.5–5.1)
Sodium: 136 mmol/L (ref 135–145)
TOTAL PROTEIN: 6.1 g/dL — AB (ref 6.5–8.1)

## 2016-10-22 LAB — CBC
HEMATOCRIT: 21.8 % — AB (ref 36.0–46.0)
HEMOGLOBIN: 6.9 g/dL — AB (ref 12.0–15.0)
MCH: 27.3 pg (ref 26.0–34.0)
MCHC: 31.7 g/dL (ref 30.0–36.0)
MCV: 86.2 fL (ref 78.0–100.0)
Platelets: 377 10*3/uL (ref 150–400)
RBC: 2.53 MIL/uL — ABNORMAL LOW (ref 3.87–5.11)
RDW: 15.5 % (ref 11.5–15.5)
WBC: 11.8 10*3/uL — AB (ref 4.0–10.5)

## 2016-10-22 LAB — PROTIME-INR
INR: 1.04
Prothrombin Time: 13.5 seconds (ref 11.4–15.2)

## 2016-10-22 LAB — URINALYSIS, COMPLETE (UACMP) WITH MICROSCOPIC
Bilirubin Urine: NEGATIVE
Glucose, UA: NEGATIVE mg/dL
Hgb urine dipstick: NEGATIVE
Ketones, ur: NEGATIVE mg/dL
Nitrite: POSITIVE — AB
PH: 7 (ref 5.0–8.0)
Protein, ur: NEGATIVE mg/dL
SPECIFIC GRAVITY, URINE: 1.01 (ref 1.005–1.030)
Squamous Epithelial / LPF: NONE SEEN

## 2016-10-22 LAB — POC OCCULT BLOOD, ED: Fecal Occult Bld: POSITIVE — AB

## 2016-10-22 LAB — PREPARE RBC (CROSSMATCH)

## 2016-10-22 MED ORDER — LORAZEPAM 2 MG/ML IJ SOLN
0.5000 mg | Freq: Once | INTRAMUSCULAR | Status: AC
  Administered 2016-10-22: 0.5 mg via INTRAVENOUS

## 2016-10-22 MED ORDER — NIFEDIPINE ER 60 MG PO TB24
60.0000 mg | ORAL_TABLET | Freq: Every day | ORAL | Status: DC
  Administered 2016-10-23 – 2016-10-26 (×4): 60 mg via ORAL
  Filled 2016-10-22 (×5): qty 1

## 2016-10-22 MED ORDER — PANTOPRAZOLE SODIUM 40 MG IV SOLR
40.0000 mg | Freq: Once | INTRAVENOUS | Status: AC
  Administered 2016-10-23: 40 mg via INTRAVENOUS
  Filled 2016-10-22: qty 40

## 2016-10-22 MED ORDER — OMEGA-3-ACID ETHYL ESTERS 1 G PO CAPS
1.0000 g | ORAL_CAPSULE | Freq: Every day | ORAL | Status: DC
  Administered 2016-10-23 – 2016-10-26 (×4): 1 g via ORAL
  Filled 2016-10-22 (×5): qty 1

## 2016-10-22 MED ORDER — LORATADINE 10 MG PO TABS
10.0000 mg | ORAL_TABLET | Freq: Every day | ORAL | Status: DC
  Administered 2016-10-24 – 2016-10-26 (×3): 10 mg via ORAL
  Filled 2016-10-22 (×3): qty 1

## 2016-10-22 MED ORDER — LATANOPROST 0.005 % OP SOLN
1.0000 [drp] | Freq: Every day | OPHTHALMIC | Status: DC
  Administered 2016-10-23 – 2016-10-25 (×4): 1 [drp] via OPHTHALMIC
  Filled 2016-10-22: qty 2.5

## 2016-10-22 MED ORDER — PANTOPRAZOLE SODIUM 40 MG IV SOLR: 40.0000 mg | Freq: Two times a day (BID) | INTRAVENOUS | Status: DC

## 2016-10-22 MED ORDER — VITAMIN D 1000 UNITS PO TABS
2000.0000 [IU] | ORAL_TABLET | Freq: Every day | ORAL | Status: DC
  Administered 2016-10-23 – 2016-10-25 (×4): 2000 [IU] via ORAL
  Filled 2016-10-22 (×4): qty 2

## 2016-10-22 MED ORDER — SERTRALINE HCL 50 MG PO TABS
50.0000 mg | ORAL_TABLET | Freq: Every day | ORAL | Status: DC
  Administered 2016-10-23 – 2016-10-26 (×4): 50 mg via ORAL
  Filled 2016-10-22 (×5): qty 1

## 2016-10-22 MED ORDER — SODIUM CHLORIDE 0.9% FLUSH: 3.0000 mL | INTRAVENOUS | Status: DC | PRN

## 2016-10-22 MED ORDER — PANTOPRAZOLE SODIUM 40 MG IV SOLR
40.0000 mg | Freq: Once | INTRAVENOUS | Status: AC
  Administered 2016-10-22: 40 mg via INTRAVENOUS
  Filled 2016-10-22: qty 40

## 2016-10-22 MED ORDER — SODIUM CHLORIDE 0.9 % IV SOLN
8.0000 mg/h | INTRAVENOUS | Status: DC
  Administered 2016-10-22 – 2016-10-25 (×6): 8 mg/h via INTRAVENOUS
  Filled 2016-10-22 (×12): qty 80

## 2016-10-22 MED ORDER — POTASSIUM CHLORIDE CRYS ER 20 MEQ PO TBCR
40.0000 meq | EXTENDED_RELEASE_TABLET | Freq: Once | ORAL | Status: AC
  Administered 2016-10-22: 40 meq via ORAL
  Filled 2016-10-22: qty 2

## 2016-10-22 MED ORDER — SODIUM CHLORIDE 0.9% FLUSH
3.0000 mL | Freq: Two times a day (BID) | INTRAVENOUS | Status: DC
  Administered 2016-10-22 – 2016-10-24 (×4): 3 mL via INTRAVENOUS

## 2016-10-22 MED ORDER — ACETAMINOPHEN 650 MG RE SUPP
650.0000 mg | Freq: Four times a day (QID) | RECTAL | Status: DC | PRN
  Filled 2016-10-22: qty 1

## 2016-10-22 MED ORDER — SODIUM CHLORIDE 0.9 % IV SOLN
10.0000 mL/h | Freq: Once | INTRAVENOUS | Status: AC
  Administered 2016-10-22: 10 mL/h via INTRAVENOUS

## 2016-10-22 MED ORDER — ZOLPIDEM TARTRATE 5 MG PO TABS
5.0000 mg | ORAL_TABLET | Freq: Every day | ORAL | Status: DC
  Administered 2016-10-22 – 2016-10-25 (×4): 5 mg via ORAL
  Filled 2016-10-22 (×4): qty 1

## 2016-10-22 MED ORDER — DIPHENHYDRAMINE HCL 25 MG PO CAPS
25.0000 mg | ORAL_CAPSULE | Freq: Four times a day (QID) | ORAL | Status: DC | PRN
  Administered 2016-10-23 – 2016-10-26 (×3): 25 mg via ORAL
  Filled 2016-10-22 (×3): qty 1

## 2016-10-22 MED ORDER — POTASSIUM CHLORIDE CRYS ER 10 MEQ PO TBCR
10.0000 meq | EXTENDED_RELEASE_TABLET | Freq: Every day | ORAL | Status: DC
  Administered 2016-10-23 – 2016-10-26 (×4): 10 meq via ORAL
  Filled 2016-10-22 (×4): qty 1

## 2016-10-22 MED ORDER — LORAZEPAM 1 MG PO TABS: 0.5000 mg | ORAL_TABLET | Freq: Every day | ORAL | Status: DC

## 2016-10-22 MED ORDER — SODIUM CHLORIDE 0.9 % IV SOLN: 250.0000 mL | INTRAVENOUS | Status: DC | PRN

## 2016-10-22 MED ORDER — SODIUM CHLORIDE 0.9% FLUSH
3.0000 mL | Freq: Two times a day (BID) | INTRAVENOUS | Status: DC
  Administered 2016-10-22 – 2016-10-25 (×5): 3 mL via INTRAVENOUS

## 2016-10-22 MED ORDER — LORAZEPAM 0.5 MG PO TABS
0.5000 mg | ORAL_TABLET | Freq: Four times a day (QID) | ORAL | Status: DC | PRN
  Administered 2016-10-23 – 2016-10-25 (×4): 0.5 mg via ORAL
  Filled 2016-10-22 (×4): qty 1

## 2016-10-22 MED ORDER — ONDANSETRON HCL 4 MG/2ML IJ SOLN
4.0000 mg | Freq: Four times a day (QID) | INTRAMUSCULAR | Status: DC | PRN
Start: 2016-10-22 — End: 2016-10-26

## 2016-10-22 MED ORDER — MORPHINE SULFATE (PF) 4 MG/ML IV SOLN
1.0000 mg | INTRAVENOUS | Status: DC | PRN
  Administered 2016-10-23 (×2): 2 mg via INTRAVENOUS
  Filled 2016-10-22 (×3): qty 1

## 2016-10-22 MED ORDER — ONDANSETRON HCL 4 MG PO TABS: 4.0000 mg | ORAL_TABLET | Freq: Four times a day (QID) | ORAL | Status: DC | PRN

## 2016-10-22 MED ORDER — LORAZEPAM 2 MG/ML IJ SOLN
INTRAMUSCULAR | Status: AC
  Filled 2016-10-22: qty 1

## 2016-10-22 MED ORDER — ACETAMINOPHEN 325 MG PO TABS
650.0000 mg | ORAL_TABLET | Freq: Four times a day (QID) | ORAL | Status: DC | PRN
Start: 2016-10-22 — End: 2016-10-26
  Administered 2016-10-23: 650 mg via ORAL
  Filled 2016-10-22: qty 2

## 2016-10-22 MED ORDER — PREDNISONE 5 MG PO TABS
2.5000 mg | ORAL_TABLET | Freq: Every day | ORAL | Status: DC
  Administered 2016-10-23 – 2016-10-26 (×4): 2.5 mg via ORAL
  Filled 2016-10-22 (×5): qty 1

## 2016-10-22 NOTE — ED Triage Notes (Signed)
Per EMS: Pt has had dark tarry stools x 10 days.  Pt went to PCP today and was told her Hgb was 6.5. EMS could not find any paperwork that said this.  Pt A&Ox4, no complaints and VSS.

## 2016-10-22 NOTE — H&P (Signed)
History and Physical    Jamie Brown AQT:[id number] DOB: [birth year] DOA: 10/22/2016  PCP: Javier Glazier, MD   Patient coming from: [facility]  Chief Complaint: Melena, dark emesis, abd pain  HPI: Jamie Brown is a 83 y.o. 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 Patient reportedly fell approximately 1 month ago, was found to have suffered a dense fracture, has been kept in a cervical collar, and has follow-up with neurosurgery scheduled for 10/24/2016. She had been doing well until 10/19/2016, when she began to complain of feeling unwell, noting that she was having abdominal pain and nausea, and had an episode of emesis that was unwitnessed, but dark Grady vomit was noted on her cervical collar. The following day, she began to have loose black stools. Today, stools have been formed, but continue to be black. She is not anticoagulated. Advil is on her medicine list, but family does not believe she has been taking it. She does take an aspirin 81 mg and has been on a low-dose prednisone as well. She does not take H2 blocker or PPI. Denies any history of GI bleeding. Denies chest pain, headache, or confusion.  ED Course: Upon arrival to the ED, patient is found to be afebrile, saturating well on room air, and with vitals otherwise stable. Orthostatic vital signs are negative. Chemistry panel reveals a potassium of 3.3 and CBC is notable for hemoglobin of 6.9, down from 13.5 one month ago. Fecal occult blood testing was positive. Patient was given 40 mEq of oral potassium, 40 mg IV Protonix, 1 unit of packed red blood cells, and gastroenterology was consulted by the ED physician. Patient remained hemodynamically stable  and in no apparent respiratory distress in the ED, and will be admitted to the telemetry unit for ongoing evaluation and management of acute upper GI bleed.  Review of Systems:  All other systems reviewed and apart from HPI, are negative.  Past Medical History:  Diagnosis Date  . Coronary artery disease   . High cholesterol   . HTN (hypertension) 05/28/2015  . Hypertension   . Radiation 11/19/14-12/30/14   upper right neck 5000 cGy  . Squamous cell carcinoma     Past Surgical History:  Procedure Laterality Date  . BACK SURGERY    . CESAREAN SECTION    . ROTATOR CUFF REPAIR Bilateral      reports that she has never smoked. She has never used smokeless tobacco. She reports that she does not drink alcohol or use drugs.  Allergies  Allergen Reactions  . Codeine Itching    Hives  . Gabapentin Other (See Comments)    Difficulty finding words and foggy feeling  . Isoptin [Verapamil] Itching  . Remeron [Mirtazapine] Other (See Comments)    Muscle cramping and pain  . Trazodone And Nefazodone Other (See Comments)    Mental confusion  . Aldara [Imiquimod] Rash    Redness     History reviewed. No pertinent family history.   Prior to Admission medications   Medication Sig Start Date End Date Taking? Authorizing Provider  aspirin EC 81 MG tablet Take 81 mg by mouth daily.    [provider]  cetirizine (ZYRTEC) 10 MG tablet Take 10 mg by mouth daily.  [provider]  Cholecalciferol (VITAMIN D) 2000 units tablet Take 2,000 Units by mouth at bedtime.    [provider]  CRANBERRY EXTRACT PO Take 25,000 mg by mouth at bedtime.     [provider]  docusate sodium (STOOL SOFTENER) 100 MG capsule Take 100 mg by mouth daily as needed for mild constipation.    [provider]  glycerin, Pediatric, 1.2 g SUPP Place 1 suppository rectally daily as needed for moderate constipation.    [provider]  ibuprofen (ADVIL,MOTRIN) 200 MG  tablet Take 600 mg by mouth every 6 (six) hours as needed for fever, headache, mild pain, moderate pain or cramping.    [provider]  LORazepam (ATIVAN) 0.5 MG tablet Take 0.5 mg by mouth at bedtime. Usually takes between 12am and 2pm when she gets up to use restroom    [provider]  NIFEdipine (PROCARDIA XL/ADALAT-CC) 60 MG 24 hr tablet Take 60 mg by mouth daily.    [provider]  Omega-3 Fatty Acids (FISH OIL) 1200 MG CAPS Take 1,200 mg by mouth at bedtime.    [provider]  oxyCODONE-acetaminophen (PERCOCET/ROXICET) 5-325 MG tablet Take 1 tablet by mouth every 4 (four) hours as needed for severe pain. 09/22/16   Virgel Manifold, MD  polyethylene glycol [facility] / Floria Raveling) packet Take 17 g by mouth every evening.    [provider]  potassium chloride (K-DUR,KLOR-CON) 10 MEQ tablet Take 10 mEq by mouth daily with breakfast.    [provider]  predniSONE (DELTASONE) 2.5 MG tablet Take 2.5 mg by mouth daily with breakfast.    [provider]  psyllium (METAMUCIL) 58.6 % powder Take 1 packet by mouth daily with breakfast.    [provider]  sertraline (ZOLOFT) 50 MG tablet Take 50 mg by mouth daily with breakfast.    [provider]  Tafluprost (ZIOPTAN) 0.0015 % SOLN Place 1 drop into both eyes at bedtime.    [provider]  torsemide (DEMADEX) 10 MG tablet Take 5 mg by mouth See admin instructions. Takes on Tuesday, Thursday, Saturday, and Sunday    [provider]  traMADol (ULTRAM) 50 MG tablet Take 100 mg by mouth 2 (two) times daily.     [provider]  zolpidem (AMBIEN) 10 MG tablet Take 10 mg by mouth at bedtime.    [provider]    Physical Exam: Vitals:   10/22/16 2030 10/22/16 2045 10/22/16 2100 10/22/16 2115  BP: (!) 127/55 (!) 136/53 (!) 131/41 (!) 133/56  Pulse: 85 90 86 87  Resp: (!) 23 18 (!) 21 (!) 28  Temp:      TempSrc:      SpO2: 94% 96% 96%  95%  Weight:      Height:          Constitutional: NAD, calm, in apparent discomfort Eyes: PERTLA, lids and conjunctivae normal ENMT: Mucous membranes are moist. Posterior pharynx clear of any exudate or lesions.   Neck: Cervical collar in place.  Respiratory: clear to auscultation bilaterally, no wheezing, no crackles. Normal respiratory effort.   Cardiovascular: S1 & S2 heard, regular rate and rhythm. 1+ pretibial edema bilaterally.  Abdomen: No distension, no tenderness, soft, mild generalized tenderness, no rebound pain or guarding. Bowel sounds active.  Musculoskeletal: no clubbing / cyanosis. No joint deformity upper and lower extremities.  Skin: no significant rashes, lesions, ulcers. Warm, dry, well-perfused. Neurologic: CN 2-12 grossly intact. Sensation intact. Strength 5/5 in all  4 limbs. Gross hearing deficit.  Psychiatric: Alert and oriented x 3. Calm, cooperative.     Labs on Admission: I have personally reviewed following labs and imaging studies  CBC:  Recent Labs Lab 10/22/16 2013  WBC 11.8*  HGB 6.9*  HCT 21.8*  MCV 86.2  PLT 956   Basic Metabolic Panel:  Recent Labs Lab 10/22/16 2013  NA 136  K 3.3*  CL 98*  CO2 26  GLUCOSE 124*  BUN 18  CREATININE 0.88  CALCIUM 8.8*   GFR: Estimated Creatinine Clearance: 38.2 mL/min (by C-G formula based on SCr of 0.88 mg/dL). Liver Function Tests:  Recent Labs Lab 10/22/16 2013  AST 24  ALT 18  ALKPHOS 64  BILITOT 0.4  PROT 6.1*  ALBUMIN 3.4*   No results for input(s): LIPASE, AMYLASE in the last 168 hours. No results for input(s): AMMONIA in the last 168 hours. Coagulation Profile:  Recent Labs Lab 10/22/16 2053  INR 1.04   Cardiac Enzymes: No results for input(s): CKTOTAL, CKMB, CKMBINDEX, TROPONINI in the last 168 hours. BNP (last 3 results) No results for input(s): PROBNP in the last 8760 hours. HbA1C: No results for input(s): HGBA1C in the last 72 hours. CBG: No results for  input(s): GLUCAP in the last 168 hours. Lipid Profile: No results for input(s): CHOL, HDL, LDLCALC, TRIG, CHOLHDL, LDLDIRECT in the last 72 hours. Thyroid Function Tests: No results for input(s): TSH, T4TOTAL, FREET4, T3FREE, THYROIDAB in the last 72 hours. Anemia Panel: No results for input(s): VITAMINB12, FOLATE, FERRITIN, TIBC, IRON, RETICCTPCT in the last 72 hours. Urine analysis:    Component Value Date/Time   COLORURINE YELLOW 10/22/2016 2037   APPEARANCEUR CLEAR 10/22/2016 2037   LABSPEC 1.010 10/22/2016 2037   PHURINE 7.0 10/22/2016 2037   GLUCOSEU NEGATIVE 10/22/2016 2037   HGBUR NEGATIVE 10/22/2016 2037   BILIRUBINUR NEGATIVE 10/22/2016 2037   KETONESUR NEGATIVE 10/22/2016 2037   PROTEINUR NEGATIVE 10/22/2016 2037   UROBILINOGEN 0.2 08/23/2006 2210   NITRITE POSITIVE (A) 10/22/2016 2037   LEUKOCYTESUR MODERATE (A) 10/22/2016 2037   Sepsis Labs: @LABRCNTIP (procalcitonin:4,lacticidven:4) )No results found for this or any previous visit (from the past 240 hour(s)).   Radiological Exams on Admission: No results found.  EKG: Not performed.   Assessment/Plan  1. Upper GIB, symptomatic anemia  - Pt presents with mild abd pain, nausea, one episode of dark emesis on 9/27, followed by melena  - Hgb found to be 6.9 on admission, down from 13.5 one month earlier  - FOBT is positive  - She was treated with 40 mg IV Protonix and 1 unit RBC's in ED  - Plan to continue IV PPI, check post-transfusion CBC, hold ASA  - GI is consulting and much appreciated, will follow-up on recommendations   2. Dens fracture - Pt suffered dens fracture 1 month ago and has been in cervical collar since  - She has follow-up scheduled with neurosurgeon, Dr. Arnoldo Morale, on 10/24/16  - Clinically stable   3. Hypertension  - BP is at goal  - Continue nifedipine   4. Hypokalemia  - Serum potassium is 3.4 on admission - Treated with 40 mEq oral potassium in ED  - Repeat chem panel in am    5.  Depression, anxiety, insomnia  - Stable  - Continue Zoloft, Ativan, Ambien    DVT prophylaxis: SCD's  Code Status: Full  Family Communication: Children updated at bedside Disposition Plan: Admit to telemetry Consults called: Gastroenterology Admission status: Inpatient    Christia Reading  Criss Rosales, MD [facility] Pager [phone]  If 7PM-7AM, please contact night-coverage [email or web address] Password [email or web address]  10/22/2016, 10:12 PM

## 2016-10-22 NOTE — ED Notes (Signed)
Pt assisted to bedside commode with steady gait.

## 2016-10-22 NOTE — ED Provider Notes (Signed)
[facility] DEPT Provider Note   CSN: [id number] Arrival date & time: 10/22/16  2005  History   Chief Complaint Chief Complaint  Patient presents with  . Rectal Bleeding    HPI Jamie Brown is a 83 y.o. female.  The patient is a 83yo female with a past medical history significant for CAD on 81mg  ASA daily, squamous cell carcinoma of the neck, HTN, and HLD, who presents to the ED complaining of diarrhea and dark stools.  The patient had a fall ~1 month ago with a resulting dens fracture.  She is now at an assisted living facility with a cervical collar in place at all times.  This week, she had 1 episode of dark vomitus on Thursday.  On Friday, she had a ground-level fall with resultant bruising to her right knee, however no other injuries.  Last night, she began having diarrhea.  Her daughter reports her stool was dark yesterday, and dark today, however she did have a well formed stool today.  Her hemoglobin was evaluated at her nursing facility and was 6.9 prior to ED arrival.  At this time, the patient is complaining of generalized abdominal pain.  No fevers, chills, active diarrhea, nausea, or emesis.   The history is provided by the patient, a relative and medical records. No language interpreter was used.   Past Medical History:  Diagnosis Date  . Coronary artery disease   . High cholesterol   . HTN (hypertension) 05/28/2015  . Hypertension   . Radiation 11/19/14-12/30/14   upper right neck 5000 cGy  . Squamous cell carcinoma    Patient Active Problem List   Diagnosis Date Noted  . Acute upper GI bleed 10/22/2016  . Anemia 10/22/2016  . Coronary artery disease 10/22/2016  . Dens fracture ([place]) 10/22/2016  . Hypokalemia 10/22/2016  . Acute GI bleeding 10/22/2016  . Sepsis ([place]) 05/28/2015  . UTI (lower urinary tract infection) 05/28/2015  . HLD (hyperlipidemia) 05/28/2015  . HTN (hypertension) 05/28/2015  . Lactic acidosis 05/28/2015  . Insomnia 05/28/2015  . Squamous  cell carcinoma of skin of neck 10/29/2014   Past Surgical History:  Procedure Laterality Date  . BACK SURGERY    . CESAREAN SECTION    . ROTATOR CUFF REPAIR Bilateral    OB History    No data available     Home Medications    Prior to Admission medications   Medication Sig Start Date End Date Taking? Authorizing Provider  acetaminophen (TYLENOL) 325 MG tablet Take 650 mg by mouth every 6 (six) hours as needed for mild pain.   Yes [provider]  acetaminophen (TYLENOL) 500 MG tablet Take 1,000 mg by mouth 2 (two) times daily.   Yes [provider]  aspirin EC 81 MG tablet Take 81 mg by mouth daily.   Yes [provider]  bisacodyl (DULCOLAX) 10 MG suppository Place 10 mg rectally every 3 (three) days.   Yes [provider]  Calcium Carbonate-Vitamin D3 (CALCIUM 600-D) 600-400 MG-UNIT TABS Take 1 tablet by mouth daily.   Yes [provider]  cetirizine (ZYRTEC) 10 MG tablet Take 10 mg by mouth daily.   Yes [provider]  docusate sodium (STOOL SOFTENER) 100 MG capsule Take 100 mg by mouth daily as needed for mild constipation.   Yes [provider]  NIFEdipine (PROCARDIA XL/ADALAT-CC) 60 MG 24 hr tablet Take 60 mg by mouth daily.   Yes [provider]  nystatin (MYCOSTATIN/NYSTOP) powder Apply 1 g  topically 3 (three) times daily.   Yes [provider]  ondansetron (ZOFRAN) 4 MG tablet Take 4 mg by mouth every 8 (eight) hours as needed for nausea or vomiting.   Yes [provider]  oxyCODONE (OXY IR/ROXICODONE) 5 MG immediate release tablet Take 5 mg by mouth every 4 (four) hours as needed for severe pain.   Yes [provider]  potassium chloride (K-DUR,KLOR-CON) 10 MEQ tablet Take 10 mEq by mouth daily with breakfast.   Yes [provider]  predniSONE (DELTASONE) 2.5 MG tablet Take 2.5 mg by mouth daily with breakfast.   Yes [provider]  sertraline (ZOLOFT) 50 MG  tablet Take 50 mg by mouth daily with breakfast.   Yes [provider]  Tafluprost (ZIOPTAN) 0.0015 % SOLN Place 1 drop into both eyes at bedtime.   Yes [provider]  tiZANidine (ZANAFLEX) 2 MG tablet Take 2 mg by mouth every 8 (eight) hours as needed for muscle spasms.   Yes [provider]  torsemide (DEMADEX) 10 MG tablet Take 10-30 mg by mouth See admin instructions. 10 mg four times a week, 30 mg three times a week   Yes [provider]  zolpidem (AMBIEN) 10 MG tablet Take 10 mg by mouth at bedtime.   Yes [provider]   Family History History reviewed. No pertinent family history.  Social History Social History  Substance Use Topics  . Smoking status: Never Smoker  . Smokeless tobacco: Never Used  . Alcohol use No   Allergies   Codeine; Gabapentin; Isoptin [verapamil]; Remeron [mirtazapine]; Trazodone and nefazodone; and Aldara [imiquimod]  Review of Systems Review of Systems  Constitutional: Negative for chills and fever.  HENT: Negative.   Eyes: Negative for visual disturbance.  Respiratory: Negative for cough and shortness of breath.   Cardiovascular: Negative for chest pain and palpitations.  Gastrointestinal: Positive for abdominal pain, blood in stool and diarrhea. Negative for abdominal distention, nausea and vomiting.  Genitourinary: Negative for difficulty urinating, dysuria, frequency, hematuria and urgency.  Musculoskeletal: Negative for arthralgias and back pain.  Skin: Negative for color change and rash.  Neurological: Negative for seizures and syncope.  All other systems reviewed and are negative.  Physical Exam Updated Vital Signs BP (!) 124/54   Pulse 76   Temp 98.2 F (36.8 C) (Oral)   Resp 16   Ht 5\' 5"  (1.651 m)   Wt 68 kg (150 lb)   SpO2 98%   BMI 24.96 kg/m   Physical Exam  Constitutional: She is oriented to person, place, and time. She appears well-developed and well-nourished. No distress.    HENT:  Head: Normocephalic and atraumatic.  Mouth/Throat: Oropharynx is clear and moist.  Eyes: Conjunctivae are normal.  Mild conjunctival pallor  Neck: Neck supple.  c-collar place  Cardiovascular: Normal rate, regular rhythm and intact distal pulses.   Murmur heard. Pulmonary/Chest: Effort normal and breath sounds normal. No respiratory distress.  Abdominal: Soft. She exhibits no mass. There is tenderness (diffuse). There is no guarding.  Genitourinary: Rectal exam shows guaiac positive stool (maroon stool; no hemorrhoids, fissures, or fluctuance).  Musculoskeletal: She exhibits no edema.  Neurological: She is alert and oriented to person, place, and time.  Skin: Skin is warm and dry. Capillary refill takes less than 2 seconds.  Psychiatric: She has a normal mood and affect. Her behavior is normal. Judgment and thought content normal.  Nursing note and vitals reviewed.  ED Treatments / Results  Labs (all  labs ordered are listed, but only abnormal results are displayed) Labs Reviewed  COMPREHENSIVE METABOLIC PANEL - Abnormal; Notable for the following:       Result Value   Potassium 3.3 (*)    Chloride 98 (*)    Glucose, Bld 124 (*)    Calcium 8.8 (*)    Total Protein 6.1 (*)    Albumin 3.4 (*)    GFR calc non Af Amer 56 (*)    All other components within normal limits  CBC - Abnormal; Notable for the following:    WBC 11.8 (*)    RBC 2.53 (*)    Hemoglobin 6.9 (*)    HCT 21.8 (*)    All other components within normal limits  URINALYSIS, COMPLETE (UACMP) WITH MICROSCOPIC - Abnormal; Notable for the following:    Nitrite POSITIVE (*)    Leukocytes, UA MODERATE (*)    Bacteria, UA RARE (*)    All other components within normal limits  POC OCCULT BLOOD, ED - Abnormal; Notable for the following:    Fecal Occult Bld POSITIVE (*)    All other components within normal limits  PROTIME-INR  BASIC METABOLIC PANEL  TYPE AND SCREEN  PREPARE RBC (CROSSMATCH)  ABO/RH   EKG   EKG Interpretation None      Radiology No results found.  Procedures Procedures (including critical care time)  Medications Ordered in ED Medications  loratadine (CLARITIN) tablet 10 mg (not administered)  predniSONE (DELTASONE) tablet 2.5 mg (not administered)  cholecalciferol (VITAMIN D) tablet 2,000 Units (2,000 Units Oral Given 10/23/16 0021)  omega-3 acid ethyl esters (LOVAZA) capsule 1 g (not administered)  potassium chloride (K-DUR,KLOR-CON) CR tablet 10 mEq (not administered)  sertraline (ZOLOFT) tablet 50 mg (not administered)  latanoprost (XALATAN) 0.005 % ophthalmic solution 1 drop (1 drop Both Eyes Given 10/23/16 0022)  zolpidem (AMBIEN) tablet 5 mg (5 mg Oral Given 10/22/16 2346)  NIFEdipine (PROCARDIA-XL/ADALAT CC) 24 hr tablet 60 mg (not administered)  sodium chloride flush (NS) 0.9 % injection 3 mL (3 mLs Intravenous Given 10/22/16 2357)  sodium chloride flush (NS) 0.9 % injection 3 mL (3 mLs Intravenous Given 10/22/16 2357)  sodium chloride flush (NS) 0.9 % injection 3 mL (not administered)  0.9 %  sodium chloride infusion (not administered)  acetaminophen (TYLENOL) tablet 650 mg (not administered)    Or  acetaminophen (TYLENOL) suppository 650 mg (not administered)  ondansetron (ZOFRAN) tablet 4 mg (not administered)    Or  ondansetron (ZOFRAN) injection 4 mg (not administered)  pantoprazole (PROTONIX) 80 mg in sodium chloride 0.9 % 250 mL (0.32 mg/mL) infusion (8 mg/hr Intravenous New Bag/Given 10/22/16 2359)  pantoprazole (PROTONIX) injection 40 mg (not administered)  morphine 4 MG/ML injection 1-3 mg (not administered)  diphenhydrAMINE (BENADRYL) capsule 25 mg (not administered)  LORazepam (ATIVAN) tablet 0.5 mg (not administered)  potassium chloride SA (K-DUR,KLOR-CON) CR tablet 40 mEq (40 mEq Oral Given 10/22/16 2154)  0.9 %  sodium chloride infusion (0 mL/hr Intravenous Stopped 10/23/16 0024)  pantoprazole (PROTONIX) injection 40 mg (40 mg Intravenous Given  10/22/16 2154)  pantoprazole (PROTONIX) injection 40 mg (40 mg Intravenous Given 10/23/16 0002)  LORazepam (ATIVAN) injection 0.5 mg (0.5 mg Intravenous Given 10/22/16 2254)   Initial Impression / Assessment and Plan / ED Course  I have reviewed the triage vital signs and the nursing notes.  Pertinent labs & imaging results that were available during my care of the patient were reviewed by me and considered in my medical decision  making (see chart for details).    Initial differential diagnosis included UGIB, LGIB, hemorrhoids, diverticulitis, diverticulosis, UTI, and anemia.  Notable labs included mild leukocytosis and normocytic anemia with a hemoglobin 6.9.  CMP notable for hypokalemia and a normal creatinine.  UA positive for nitrates, rare bacteria, and leukocytes.  Normal coags. EKG with 1st degree AV block; no evidence of arrhythmia, ischemia, or infarct.  No imaging studies indicated at this time.    The patient was consented for and administered 1u pRBCs as well as IV protonix.  I spoke with the gastroenterology team who agreed to evaluate the patient tomorrow morning.    Based on the above findings and given the color of her stool, I believe an upper GI bleed is the most likely cause of her symptoms.  No evidence of hemorrhoid or rectal fissure.  She is hemodynamically stable.  Although she has mild diffuse abdominal tenderness, she denies pain in the suprapubic region and has no urinary symptoms, decreasing my suspicion for UTI (despite UA results).    The patient was admitted to the hospitalist service for further evaluation and treatment.  The patient was in stable condition at the time of admission.  Final Clinical Impressions(s) / ED Diagnoses   Final diagnoses:  Acute GI bleeding  Anemia, unspecified type  Hypokalemia   New Prescriptions New Prescriptions   No medications on file     Charisse March, MD 10/23/16 0626    Elnora Morrison, MD 10/27/16 [phone]

## 2016-10-22 NOTE — Progress Notes (Signed)
GI Preliminary note--we have been advised of the patient's admission and will plan to see her in the morning, but please call us in the meantime if more immediate input is needed.  From chart review, I would wonder about diverticular bleeding b/o maroon stool on rectal exam (per d/w EDP), pt's age, the history of bleeding going on for ?days, and her relative clinical stability.   On the other hand, she is on ASA and not on PPI, so we have to wonder about possible UGIB from ASA-induced gastropathy.  Cleotis Nipper, M.D. Pager [phone] If no answer or after 5 PM call [phone]

## 2016-10-23 ENCOUNTER — Encounter (HOSPITAL_COMMUNITY): Payer: Self-pay | Admitting: General Practice

## 2016-10-23 DIAGNOSIS — S12100D Unspecified displaced fracture of second cervical vertebra, subsequent encounter for fracture with routine healing: Secondary | ICD-10-CM

## 2016-10-23 DIAGNOSIS — K922 Gastrointestinal hemorrhage, unspecified: Secondary | ICD-10-CM

## 2016-10-23 DIAGNOSIS — E876 Hypokalemia: Secondary | ICD-10-CM

## 2016-10-23 DIAGNOSIS — D649 Anemia, unspecified: Secondary | ICD-10-CM

## 2016-10-23 HISTORY — DX: Gastrointestinal hemorrhage, unspecified: K92.2

## 2016-10-23 LAB — BASIC METABOLIC PANEL
ANION GAP: 7 (ref 5–15)
BUN: 16 mg/dL (ref 6–20)
CO2: 24 mmol/L (ref 22–32)
Calcium: 7.6 mg/dL — ABNORMAL LOW (ref 8.9–10.3)
Chloride: 109 mmol/L (ref 101–111)
Creatinine, Ser: 0.81 mg/dL (ref 0.44–1.00)
GFR calc Af Amer: >60 mL/min (ref >60)
GLUCOSE: 105 mg/dL — AB (ref 65–99)
POTASSIUM: 3.4 mmol/L — AB (ref 3.5–5.1)
Sodium: 140 mmol/L (ref 135–145)

## 2016-10-23 LAB — HEMOGLOBIN AND HEMATOCRIT, BLOOD
HCT: 19.9 % — ABNORMAL LOW (ref 36.0–46.0)
HEMATOCRIT: 24.1 % — AB (ref 36.0–46.0)
HEMATOCRIT: 29.1 % — AB (ref 36.0–46.0)
Hemoglobin: 6.5 g/dL — CL (ref 12.0–15.0)
Hemoglobin: 7.8 g/dL — ABNORMAL LOW (ref 12.0–15.0)
Hemoglobin: 9.4 g/dL — ABNORMAL LOW (ref 12.0–15.0)

## 2016-10-23 LAB — ABO/RH: ABO/RH(D): A POS

## 2016-10-23 LAB — PREPARE RBC (CROSSMATCH)

## 2016-10-23 LAB — MRSA PCR SCREENING: MRSA BY PCR: POSITIVE — AB

## 2016-10-23 MED ORDER — SODIUM CHLORIDE 0.9 % IV SOLN
Freq: Once | INTRAVENOUS | Status: AC
  Administered 2016-10-23: 09:00:00 via INTRAVENOUS

## 2016-10-23 MED ORDER — INFLUENZA VAC SPLIT HIGH-DOSE 0.5 ML IM SUSY
0.5000 mL | PREFILLED_SYRINGE | INTRAMUSCULAR | Status: AC
  Administered 2016-10-24: 0.5 mL via INTRAMUSCULAR
  Filled 2016-10-23: qty 0.5

## 2016-10-23 NOTE — Consult Note (Signed)
[facility]astroenterology Consultation Note  Referring Provider: Dr. Eulogio Bear [facility]) Primary Care Physician:  Javier Glazier, MD  Reason for Consultation:  GI bleeding  HPI: Jamie Brown is a 83 y.o. female in rehab facility after fall with neck injury 4 weeks ago (previously at ALF) presenting with few day history of dark red blood.  No abdominal pain. No hematemesis.  Takes baby ASA and low-dose steroids chronically without prophylactic PPI.  No prior GI bleeding.  No recent endoscopy or colonoscopy.     Past Medical History:  Diagnosis Date  . Arthritis    OA  . Coronary artery disease   . GI bleeding 10/23/2016  . High cholesterol   . HTN (hypertension) 05/28/2015  . Hypertension   . Radiation 11/19/14-12/30/14   upper right neck 5000 cGy  . Squamous cell carcinoma     Past Surgical History:  Procedure Laterality Date  . BACK SURGERY    . CESAREAN SECTION    . ROTATOR CUFF REPAIR Bilateral     Prior to Admission medications   Medication Sig Start Date End Date Taking? Authorizing Provider  acetaminophen (TYLENOL) 325 MG tablet Take 650 mg by mouth every 6 (six) hours as needed for mild pain.   Yes [provider]  acetaminophen (TYLENOL) 500 MG tablet Take 1,000 mg by mouth 2 (two) times daily.   Yes [provider]  aspirin EC 81 MG tablet Take 81 mg by mouth daily.   Yes [provider]  bisacodyl (DULCOLAX) 10 MG suppository Place 10 mg rectally every 3 (three) days.   Yes [provider]  Calcium Carbonate-Vitamin D3 (CALCIUM 600-D) 600-400 MG-UNIT TABS Take 1 tablet by mouth daily.   Yes [provider]  cetirizine (ZYRTEC) 10 MG tablet Take 10 mg by mouth daily.   Yes [provider]  docusate sodium (STOOL SOFTENER) 100 MG capsule Take 100 mg by mouth daily as needed for mild constipation.   Yes [provider]  NIFEdipine (PROCARDIA XL/ADALAT-CC) 60 MG 24 hr tablet Take 60 mg by mouth daily.   Yes  [provider]  nystatin (MYCOSTATIN/NYSTOP) powder Apply 1 g topically 3 (three) times daily.   Yes [provider]  ondansetron (ZOFRAN) 4 MG tablet Take 4 mg by mouth every 8 (eight) hours as needed for nausea or vomiting.   Yes [provider]  oxyCODONE (OXY IR/ROXICODONE) 5 MG immediate release tablet Take 5 mg by mouth every 4 (four) hours as needed for severe pain.   Yes [provider]  potassium chloride (K-DUR,KLOR-CON) 10 MEQ tablet Take 10 mEq by mouth daily with breakfast.   Yes [provider]  predniSONE (DELTASONE) 2.5 MG tablet Take 2.5 mg by mouth daily with breakfast.   Yes [provider]  sertraline (ZOLOFT) 50 MG tablet Take 50 mg by mouth daily with breakfast.   Yes [provider]  Tafluprost (ZIOPTAN) 0.0015 % SOLN Place 1 drop into both eyes at bedtime.   Yes [provider]  tiZANidine (ZANAFLEX) 2 MG tablet Take 2 mg by mouth every 8 (eight) hours as needed for muscle spasms.   Yes [provider]  torsemide (DEMADEX) 10 MG tablet Take 10-30 mg by mouth See admin instructions. 10 mg four times a week, 30 mg three times a week   Yes [provider]  zolpidem (AMBIEN) 10 MG tablet Take 10 mg by mouth at bedtime.   Yes [provider]    Current  Facility-Administered Medications  Medication Dose Route Frequency Provider Last Rate Last Dose  . 0.9 %  sodium chloride infusion  250 mL Intravenous PRN Opyd, Ilene Qua, MD      . acetaminophen (TYLENOL) tablet 650 mg  650 mg Oral Q6H PRN Opyd, Ilene Qua, MD   650 mg at 10/23/16 9892   Or  . acetaminophen (TYLENOL) suppository 650 mg  650 mg Rectal Q6H PRN Opyd, Ilene Qua, MD      . cholecalciferol (VITAMIN D) tablet 2,000 Units  2,000 Units Oral QHS Vianne Bulls, MD   2,000 Units at 10/23/16 0021  . diphenhydrAMINE (BENADRYL) capsule 25 mg  25 mg Oral Q6H PRN Opyd, Ilene Qua, MD   25 mg at 10/23/16 0455  . [START ON  10/24/2016] Influenza vac split quadrivalent PF (FLUZONE HIGH-DOSE) injection 0.5 mL  0.5 mL Intramuscular Tomorrow-1000 Vann, Jessica U, DO      . latanoprost (XALATAN) 0.005 % ophthalmic solution 1 drop  1 drop Both Eyes QHS Opyd, Ilene Qua, MD   1 drop at 10/23/16 0022  . loratadine (CLARITIN) tablet 10 mg  10 mg Oral Daily Opyd, Ilene Qua, MD      . LORazepam (ATIVAN) tablet 0.5 mg  0.5 mg Oral Q6H PRN Opyd, Ilene Qua, MD      . morphine 4 MG/ML injection 1-3 mg  1-3 mg Intravenous Q4H PRN Opyd, Ilene Qua, MD   2 mg at 10/23/16 0938  . NIFEdipine (PROCARDIA-XL/ADALAT CC) 24 hr tablet 60 mg  60 mg Oral Daily Opyd, Ilene Qua, MD      . omega-3 acid ethyl esters (LOVAZA) capsule 1 g  1 g Oral Daily Opyd, Ilene Qua, MD   1 g at 10/23/16 1200  . ondansetron (ZOFRAN) tablet 4 mg  4 mg Oral Q6H PRN Opyd, Ilene Qua, MD       Or  . ondansetron (ZOFRAN) injection 4 mg  4 mg Intravenous Q6H PRN Opyd, Ilene Qua, MD      . pantoprazole (PROTONIX) 80 mg in sodium chloride 0.9 % 250 mL (0.32 mg/mL) infusion  8 mg/hr Intravenous Continuous Opyd, Ilene Qua, MD   Stopped at 10/23/16 1157  . [START ON 10/26/2016] pantoprazole (PROTONIX) injection 40 mg  40 mg Intravenous Q12H Opyd, Timothy S, MD      . potassium chloride (K-DUR,KLOR-CON) CR tablet 10 mEq  10 mEq Oral Q breakfast Opyd, Ilene Qua, MD   10 mEq at 10/23/16 1194  . predniSONE (DELTASONE) tablet 2.5 mg  2.5 mg Oral Q breakfast Opyd, Ilene Qua, MD   2.5 mg at 10/23/16 1233  . sertraline (ZOLOFT) tablet 50 mg  50 mg Oral Q breakfast Opyd, Ilene Qua, MD   50 mg at 10/23/16 1233  . sodium chloride flush (NS) 0.9 % injection 3 mL  3 mL Intravenous Q12H Opyd, Ilene Qua, MD   3 mL at 10/23/16 0940  . sodium chloride flush (NS) 0.9 % injection 3 mL  3 mL Intravenous Q12H Opyd, Ilene Qua, MD   3 mL at 10/23/16 0300  . sodium chloride flush (NS) 0.9 % injection 3 mL  3 mL Intravenous PRN Opyd, Ilene Qua, MD      . zolpidem (AMBIEN) tablet 5 mg  5 mg Oral QHS Opyd,  Ilene Qua, MD   5 mg at 10/22/16 2346    Allergies as of 10/22/2016 - Review Complete 10/22/2016  Allergen Reaction Noted  . Codeine Itching 10/14/2014  . Gabapentin Other (See  Comments) 11/19/2014  . Isoptin [verapamil] Itching 10/14/2014  . Remeron [mirtazapine] Other (See Comments) 11/04/2014  . Trazodone and nefazodone Other (See Comments) 11/19/2014  . Aldara [imiquimod] Rash 05/28/2015    History reviewed. No pertinent family history.  Social History   Social History  . Marital status: Widowed    Spouse name: N/A  . Number of children: N/A  . Years of education: N/A   Occupational History  . Not on file.   Social History Main Topics  . Smoking status: Never Smoker  . Smokeless tobacco: Never Used  . Alcohol use No  . Drug use: No  . Sexual activity: No   Other Topics Concern  . Not on file   Social History Narrative  . No narrative on file    Review of Systems: As per HPI, all others negative  Physical Exam: Vital signs in last 24 hours: Temp:  [98.2 F (36.8 C)-98.7 F (37.1 C)] 98.4 F (36.9 C) (10/01 1225) Pulse Rate:  [70-90] 74 (10/01 1225) Resp:  [16-28] 18 (10/01 1225) BP: (108-143)/(41-97) 131/48 (10/01 1225) SpO2:  [90 %-100 %] 97 % (10/01 1225) Weight:  [150 lb (68 kg)] 150 lb (68 kg) (09/30 2008) Last BM Date: 10/21/16 General:   Alert,  Elderly, pleasant and cooperative in NAD Head:  Normocephalic and atraumatic. Eyes:  Sclera clear, no icterus.   Conjunctiva pink. Ears:  Normal auditory acuity. Nose:  No deformity, discharge,  or lesions. Mouth:  No deformity or lesions.  Oropharynx pink & moist. Neck:  Neck brace; no masses or thyromegaly. Abdomen:  Soft, nontender and nondistended. No masses, hepatosplenomegaly or hernias noted. Normal bowel sounds, without guarding, and without rebound.     Msk:  Symmetrical without gross deformities. Normal posture. Pulses:  Normal pulses noted. Extremities:  Without clubbing or  edema. Neurologic:  Alert and  oriented x4;  grossly normal neurologically. Skin:  Scattered ecchymoses, otherwise intact without significant lesions or rashes. Psych:  Alert and cooperative. Normal mood and affect.   Lab Results:  Recent Labs  10/22/16 2013 10/23/16 0252 10/23/16 0802  WBC 11.8*  --   --   HGB 6.9* 6.5* 7.8*  HCT 21.8* 19.9* 24.1*  PLT 377  --   --    BMET  Recent Labs  10/22/16 2013 10/23/16 0252  NA 136 140  K 3.3* 3.4*  CL 98* 109  CO2 26 24  GLUCOSE 124* 105*  BUN 18 16  CREATININE 0.88 0.81  CALCIUM 8.8* 7.6*   LFT  Recent Labs  10/22/16 2013  PROT 6.1*  ALBUMIN 3.4*  AST 24  ALT 18  ALKPHOS 64  BILITOT 0.4   PT/INR  Recent Labs  10/22/16 2053  LABPROT 13.5  INR 1.04    Studies/Results: No results found.  Impression:  1.  GI bleeding, UGI versus LGI.  Normal BUN, but ASA and steroids without PPI.  Not destabilizing, no overt bleeding for the past few hours. 2.  Recent fall (unrelated to current bleeding), with cervical spinal injury (dens fracture) and neck collar in place. 3.  Acute blood loss anemia.  Plan:  1.  IV PPI for the next 2-3 days.  Regardless of clinical course, will need indefinite PPI as outpatient. 2.  Hold ASA for the next day or two, to ensure bleeding cessation. 3.  Patient/family requests inpatient neurosurgical input regarding patient's neck collar and specifically regarding any instructions for neck positioning and mobility, should endoscopy ultimately be required. 4.  Plan  for medical management at this time, and only endoscopic evaluation if patient's bleeding persists despite medical therapy. 5.  Clear liquid diet ok today. 6.  [facility] will follow.   LOS: 1 day   Ivyana Locey M  10/23/2016, 2:03 PM  Cell [phone] If no answer or after 5 PM call [phone]

## 2016-10-23 NOTE — ED Notes (Addendum)
Took pt to the [facility] with one assisting.

## 2016-10-23 NOTE — ED Notes (Signed)
Pt was able to use bedside toilet, tolerated well.

## 2016-10-23 NOTE — Progress Notes (Signed)
PROGRESS NOTE    Jamie Brown  ZOX:[id number] DOB: 14-Mar-1924 DOA: 10/22/2016 PCP: Javier Glazier, MD   Outpatient Specialists:     Brief Narrative:  Jamie Brown is a 83 y.o. 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 Patient reportedly fell approximately 1 month ago, was found to have suffered a dense fracture, has been kept in a cervical collar, and has follow-up with neurosurgery scheduled for 10/24/2016. She had been doing well until 10/19/2016, when she began to complain of feeling unwell, noting that she was having abdominal pain and nausea, and had an episode of emesis that was unwitnessed, but dark  vomit was noted on her cervical collar. The following day, she began to have loose black stools. Today, stools have been formed, but continue to be black. She is not anticoagulated. Advil is on her medicine list, but family does not believe she has been taking it. She does take an aspirin 81 mg and has been on a low-dose prednisone as well. She does not take H2 blocker or PPI. Denies any history of GI bleeding. Denies chest pain, headache, or confusion.   Assessment & Plan:   Principal Problem:   Acute upper GI bleed Active Problems:   Squamous cell carcinoma of skin of neck   HTN (hypertension)   Anemia   Coronary artery disease   Dens fracture (HCC)   Hypokalemia   Acute GI bleeding   upper GIB, symptomatic anemia  - Pt presents with mild abd pain, nausea, one episode of dark emesis on 9/27, followed by melena  - Hgb found to be 6.9 on admission, down from 13.5 one month earlier  - FOBT is positive  - She was treated with 40 mg IV Protonix and 1 unit RBC's in ED  - IV PPI, transfuse 2  units PRBC - GI is consulting and much appreciated, will follow-up on recommendations  -patient does have back pain which is chronic-- no sign of flank bruising- may need CT abd/pelvis if stool unremarkable  Dens fracture - Pt suffered dens fracture 1 month ago and has been in cervical collar since  - She has follow-up scheduled with neurosurgeon, Dr. Arnoldo Morale, on 10/24/16- have placed call to office and LM for Dr. Arnoldo Morale as doubt patient will make appointment  - Clinically stable   Hypertension  - BP is at goal  - Continue nifedipine    Hypokalemia  - replete  Fall x 2 -? From anemia -PT eval when able   Depression, anxiety, insomnia  - Stable  - Continue Zoloft, Ativan, Ambien   ? UTI -culture urine    DVT prophylaxis:  SCD's  Code Status: Full Code   Family Communication:   Disposition Plan:     Consultants:   GI     Subjective: No abdominal pain No SOB Chronic back pain  Objective: Vitals:   10/23/16 0943 10/23/16 1000 10/23/16 1100 10/23/16 1141  BP: (!) 136/56 (!) 130/55 (!) 142/60 (!) 142/60  Pulse: 76 72 74 85  Resp: 18 19 18 20   Temp: 98.6 F (37 C)   98.7 F (37.1 C)  TempSrc: Oral   Oral  SpO2:  93% 94% 100%  Weight:      Height:  Intake/Output Summary (Last 24 hours) at 10/23/16 1212 Last data filed at 10/23/16 0940  Gross per 24 hour  Intake              683 ml  Output                0 ml  Net              683 ml   Filed Weights   10/22/16 2008  Weight: 68 kg (150 lb)    Examination:  General exam: Appears calm and comfortable, in neck collar Respiratory system: Clear to auscultation. Respiratory effort normal. Cardiovascular system: S1 & S2 heard, RRR. No JVD, murmurs, rubs, gallops or clicks. No pedal edema. Gastrointestinal system: Abdomen is nondistended, soft and nontender. No organomegaly or masses felt. Normal bowel sounds heard. Central nervous system: Alert and oriented. No focal neurological  deficits. Extremities: Symmetric 5 x 5 power. Skin: bruising on right knee Psychiatry: Judgement and insight appear normal. Mood & affect appropriate-- hard of hearing    Data Reviewed: I have personally reviewed following labs and imaging studies  CBC:  Recent Labs Lab 10/22/16 2013 10/23/16 0252 10/23/16 0802  WBC 11.8*  --   --   HGB 6.9* 6.5* 7.8*  HCT 21.8* 19.9* 24.1*  MCV 86.2  --   --   PLT 377  --   --    Basic Metabolic Panel:  Recent Labs Lab 10/22/16 2013 10/23/16 0252  NA 136 140  K 3.3* 3.4*  CL 98* 109  CO2 26 24  GLUCOSE 124* 105*  BUN 18 16  CREATININE 0.88 0.81  CALCIUM 8.8* 7.6*   GFR: Estimated Creatinine Clearance: 41.5 mL/min (by C-G formula based on SCr of 0.81 mg/dL). Liver Function Tests:  Recent Labs Lab 10/22/16 2013  AST 24  ALT 18  ALKPHOS 64  BILITOT 0.4  PROT 6.1*  ALBUMIN 3.4*   No results for input(s): LIPASE, AMYLASE in the last 168 hours. No results for input(s): AMMONIA in the last 168 hours. Coagulation Profile:  Recent Labs Lab 10/22/16 2053  INR 1.04   Cardiac Enzymes: No results for input(s): CKTOTAL, CKMB, CKMBINDEX, TROPONINI in the last 168 hours. BNP (last 3 results) No results for input(s): PROBNP in the last 8760 hours. HbA1C: No results for input(s): HGBA1C in the last 72 hours. CBG: No results for input(s): GLUCAP in the last 168 hours. Lipid Profile: No results for input(s): CHOL, HDL, LDLCALC, TRIG, CHOLHDL, LDLDIRECT in the last 72 hours. Thyroid Function Tests: No results for input(s): TSH, T4TOTAL, FREET4, T3FREE, THYROIDAB in the last 72 hours. Anemia Panel: No results for input(s): VITAMINB12, FOLATE, FERRITIN, TIBC, IRON, RETICCTPCT in the last 72 hours. Urine analysis:    Component Value Date/Time   COLORURINE YELLOW 10/22/2016 2037   APPEARANCEUR CLEAR 10/22/2016 2037   LABSPEC 1.010 10/22/2016 2037   PHURINE 7.0 10/22/2016 2037   GLUCOSEU NEGATIVE 10/22/2016 2037   HGBUR  NEGATIVE 10/22/2016 2037   BILIRUBINUR NEGATIVE 10/22/2016 2037   [place] NEGATIVE 10/22/2016 2037   PROTEINUR NEGATIVE 10/22/2016 2037   UROBILINOGEN 0.2 08/23/2006 2210   NITRITE POSITIVE (A) 10/22/2016 2037   LEUKOCYTESUR MODERATE (A) 10/22/2016 2037     )No results found for this or any previous visit (from the past 240 hour(s)).    Anti-infectives    None       Radiology Studies: No results found.      Scheduled Meds: . cholecalciferol  2,000 Units Oral  QHS  . latanoprost  1 drop Both Eyes QHS  . loratadine  10 mg Oral Daily  . NIFEdipine  60 mg Oral Daily  . omega-3 acid ethyl esters  1 g Oral Daily  . [START ON 10/26/2016] pantoprazole  40 mg Intravenous Q12H  . potassium chloride  10 mEq Oral Q breakfast  . predniSONE  2.5 mg Oral Q breakfast  . sertraline  50 mg Oral Q breakfast  . sodium chloride flush  3 mL Intravenous Q12H  . sodium chloride flush  3 mL Intravenous Q12H  . zolpidem  5 mg Oral QHS   Continuous Infusions: . sodium chloride    . pantoprozole (PROTONIX) infusion Stopped (10/23/16 1157)     LOS: 1 day    Time spent: 28 min    [place], DO [facility] Pager [phone]  If 7PM-7AM, please contact night-coverage [email or web address] Password [email or web address] 10/23/2016, 12:12 PM

## 2016-10-23 NOTE — ED Notes (Signed)
Nurse currently drawing labs 

## 2016-10-23 NOTE — Progress Notes (Signed)
New Admission Note:   Arrival Method: from ED on stretcher  Mental Orientation: alert and oitented x4  Telemetry: box 30  Assessment: Completed Skin:see flow sheet  IV: Pain:denies  Tubes: Safety Measures: Safety Fall Prevention Plan has been given, discussed and signed Admission: Completed [facility] Orientation: Patient has been orientated to the room, unit and staff.  Family:  Orders have been reviewed and implemented. Will continue to monitor the patient. Call light has been placed within reach and bed alarm has been activated.   Emilio Math, RN [facility]  Phone number: [phone]

## 2016-10-24 DIAGNOSIS — N390 Urinary tract infection, site not specified: Secondary | ICD-10-CM

## 2016-10-24 LAB — TYPE AND SCREEN
ABO/RH(D): A POS
ANTIBODY SCREEN: NEGATIVE
UNIT DIVISION: 0
Unit division: 0

## 2016-10-24 LAB — CBC
HCT: 29.1 % — ABNORMAL LOW (ref 36.0–46.0)
HEMOGLOBIN: 9.4 g/dL — AB (ref 12.0–15.0)
MCH: 27.4 pg (ref 26.0–34.0)
MCHC: 32.3 g/dL (ref 30.0–36.0)
MCV: 84.8 fL (ref 78.0–100.0)
PLATELETS: 341 10*3/uL (ref 150–400)
RBC: 3.43 MIL/uL — AB (ref 3.87–5.11)
RDW: 16.2 % — ABNORMAL HIGH (ref 11.5–15.5)
WBC: 11.9 10*3/uL — ABNORMAL HIGH (ref 4.0–10.5)

## 2016-10-24 LAB — BASIC METABOLIC PANEL
Anion gap: 7 (ref 5–15)
BUN: 10 mg/dL (ref 6–20)
CHLORIDE: 107 mmol/L (ref 101–111)
CO2: 25 mmol/L (ref 22–32)
Calcium: 8.8 mg/dL — ABNORMAL LOW (ref 8.9–10.3)
Creatinine, Ser: 0.76 mg/dL (ref 0.44–1.00)
GFR calc Af Amer: >60 mL/min (ref >60)
GFR calc non Af Amer: >60 mL/min (ref >60)
Glucose, Bld: 103 mg/dL — ABNORMAL HIGH (ref 65–99)
POTASSIUM: 3.5 mmol/L (ref 3.5–5.1)
SODIUM: 139 mmol/L (ref 135–145)

## 2016-10-24 LAB — BPAM RBC
BLOOD PRODUCT EXPIRATION DATE: 201810072359
Blood Product Expiration Date: 201810162359
ISSUE DATE / TIME: 201809302147
ISSUE DATE / TIME: 201810010907
UNIT TYPE AND RH: 6200
Unit Type and Rh: 6200

## 2016-10-24 MED ORDER — DEXTROSE 5 % IV SOLN
1.0000 g | INTRAVENOUS | Status: DC
  Administered 2016-10-24: 1 g via INTRAVENOUS
  Filled 2016-10-24 (×2): qty 10

## 2016-10-24 MED ORDER — POLYETHYLENE GLYCOL 3350 17 G PO PACK
17.0000 g | PACK | Freq: Every day | ORAL | Status: DC
  Administered 2016-10-24 – 2016-10-26 (×2): 17 g via ORAL
  Filled 2016-10-24 (×3): qty 1

## 2016-10-24 MED ORDER — TIZANIDINE HCL 2 MG PO TABS: 2.0000 mg | ORAL_TABLET | Freq: Three times a day (TID) | ORAL | Status: DC | PRN

## 2016-10-24 MED ORDER — OXYCODONE HCL 5 MG PO TABS
5.0000 mg | ORAL_TABLET | ORAL | Status: DC | PRN
  Administered 2016-10-24 – 2016-10-26 (×8): 5 mg via ORAL
  Filled 2016-10-24 (×9): qty 1

## 2016-10-24 NOTE — NC FL2 (Signed)
[place] MEDICAID FL2 LEVEL OF CARE SCREENING TOOL     IDENTIFICATION  Patient Name: Jamie Brown Birthdate: 07-20-1924 Sex: female Admission Date (Current Location): 10/22/2016  [facility] and [place] Number:  Herbalist and Address:  The [place], [place] [address], [place], [place] [address]      Provider Number: [id number]  Attending Physician Name and Address:  Geradine Girt, DO  Relative Name and Phone Number:       Current Level of Care: Hospital Recommended Level of Care: [place] Prior Approval Number:    Date Approved/Denied:   PASRR Number: [id number]  Discharge Plan: SNF    Current Diagnoses: Patient Active Problem List   Diagnosis Date Noted  . Acute upper GI bleed 10/22/2016  . Anemia 10/22/2016  . Coronary artery disease 10/22/2016  . Dens fracture ([place]) 10/22/2016  . Hypokalemia 10/22/2016  . Acute GI bleeding 10/22/2016  . Sepsis ([place]) 05/28/2015  . UTI (lower urinary tract infection) 05/28/2015  . HLD (hyperlipidemia) 05/28/2015  . HTN (hypertension) 05/28/2015  . Lactic acidosis 05/28/2015  . Insomnia 05/28/2015  . Squamous cell carcinoma of skin of neck 10/29/2014    Orientation RESPIRATION BLADDER Height & Weight     Self, Time, Situation, Place  Normal Continent Weight: 160 lb 4.8 oz (72.7 kg) Height:  5\' 1"  (154.9 cm)  BEHAVIORAL SYMPTOMS/MOOD NEUROLOGICAL BOWEL NUTRITION STATUS      Continent Diet (please see DC summary)  AMBULATORY STATUS COMMUNICATION OF NEEDS Skin   Limited Assist Verbally Normal                       Personal Care Assistance Level of Assistance  Bathing, Feeding, Dressing Bathing Assistance: Limited assistance Feeding assistance: Independent Dressing Assistance: Limited assistance     Functional Limitations Info  Sight, Hearing, Speech Sight Info: Adequate Hearing Info: Adequate Speech Info: Adequate    SPECIAL CARE FACTORS FREQUENCY  PT (By  licensed PT)     PT Frequency: 5x/week              Contractures Contractures Info: Not present    Additional Factors Info  Code Status, Allergies, Psychotropic Code Status Info: DNR Allergies Info: Codeine, Gabapentin, Isoptin Verapamil, Remeron Mirtazapine, Trazodone And Nefazodone, [place] Imiquimod Psychotropic Info: zoloft, ambien         Current Medications (10/24/2016):  This is the current hospital active medication list Current Facility-Administered Medications  Medication Dose Route Frequency Provider Last Rate Last Dose  . 0.9 %  sodium chloride infusion  250 mL Intravenous PRN Opyd, Ilene Qua, MD      . acetaminophen (TYLENOL) tablet 650 mg  650 mg Oral Q6H PRN Opyd, Ilene Qua, MD   650 mg at 10/23/16 1950   Or  . acetaminophen (TYLENOL) suppository 650 mg  650 mg Rectal Q6H PRN Opyd, Ilene Qua, MD      . cefTRIAXone (ROCEPHIN) 1 g in dextrose 5 % 50 mL IVPB  1 g Intravenous Q24H Eulogio Bear U, DO   Stopped at 10/24/16 1435  . cholecalciferol (VITAMIN D) tablet 2,000 Units  2,000 Units Oral QHS Vianne Bulls, MD   2,000 Units at 10/23/16 2120  . diphenhydrAMINE (BENADRYL) capsule 25 mg  25 mg Oral Q6H PRN Opyd, Ilene Qua, MD   25 mg at 10/23/16 0455  . latanoprost (XALATAN) 0.005 % ophthalmic solution 1 drop  1 drop Both Eyes QHS Opyd, Ilene Qua, MD  1 drop at 10/23/16 2137  . loratadine (CLARITIN) tablet 10 mg  10 mg Oral Daily Opyd, Ilene Qua, MD   10 mg at 10/24/16 1029  . LORazepam (ATIVAN) tablet 0.5 mg  0.5 mg Oral Q6H PRN Opyd, Ilene Qua, MD   0.5 mg at 10/24/16 0406  . NIFEdipine (PROCARDIA-XL/ADALAT CC) 24 hr tablet 60 mg  60 mg Oral Daily Opyd, Ilene Qua, MD   60 mg at 10/24/16 1029  . omega-3 acid ethyl esters (LOVAZA) capsule 1 g  1 g Oral Daily Opyd, Ilene Qua, MD   1 g at 10/24/16 1029  . ondansetron (ZOFRAN) tablet 4 mg  4 mg Oral Q6H PRN Opyd, Ilene Qua, MD       Or  . ondansetron (ZOFRAN) injection 4 mg  4 mg Intravenous Q6H PRN Opyd, Ilene Qua,  MD      . oxyCODONE (Oxy IR/ROXICODONE) immediate release tablet 5 mg  5 mg Oral Q4H PRN Eulogio Bear U, DO   5 mg at 10/24/16 1543  . pantoprazole (PROTONIX) 80 mg in sodium chloride 0.9 % 250 mL (0.32 mg/mL) infusion  8 mg/hr Intravenous Continuous Opyd, Ilene Qua, MD 25 mL/hr at 10/24/16 1434 8 mg/hr at 10/24/16 1434  . [START ON 10/26/2016] pantoprazole (PROTONIX) injection 40 mg  40 mg Intravenous Q12H Opyd, Timothy S, MD      . polyethylene glycol (MIRALAX / GLYCOLAX) packet 17 g  17 g Oral Daily Vann, Jessica U, DO   17 g at 10/24/16 1028  . potassium chloride (K-DUR,KLOR-CON) CR tablet 10 mEq  10 mEq Oral Q breakfast Opyd, Ilene Qua, MD   10 mEq at 10/24/16 1029  . predniSONE (DELTASONE) tablet 2.5 mg  2.5 mg Oral Q breakfast Opyd, Ilene Qua, MD   2.5 mg at 10/24/16 1030  . sertraline (ZOLOFT) tablet 50 mg  50 mg Oral Q breakfast Opyd, Ilene Qua, MD   50 mg at 10/24/16 1029  . sodium chloride flush (NS) 0.9 % injection 3 mL  3 mL Intravenous Q12H Opyd, Ilene Qua, MD   3 mL at 10/23/16 2136  . sodium chloride flush (NS) 0.9 % injection 3 mL  3 mL Intravenous Q12H Opyd, Ilene Qua, MD   3 mL at 10/24/16 1033  . sodium chloride flush (NS) 0.9 % injection 3 mL  3 mL Intravenous PRN Opyd, Ilene Qua, MD      . tiZANidine (ZANAFLEX) tablet 2 mg  2 mg Oral Q8H PRN Vann, Jessica U, DO      . zolpidem (AMBIEN) tablet 5 mg  5 mg Oral QHS Opyd, Ilene Qua, MD   5 mg at 10/23/16 2120     Discharge Medications: Please see discharge summary for a list of discharge medications.  Relevant Imaging Results:  Relevant Lab Results:   Additional Information SSN: [id number]  Estanislado Emms, LCSW

## 2016-10-24 NOTE — Evaluation (Signed)
Physical Therapy Evaluation Patient Details Name: Jamie Brown MRN: [id number] DOB: 22-Dec-1924 Today's Date: 10/24/2016   History of Present Illness  Jamie Brown is a 83 y.o. 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  Clinical Impression  Pt admitted with above diagnosis. Pt currently with functional limitations due to the deficits listed below (see PT Problem List). Very pleasant and motivated to get up and walk; presents with decr activity tolerance, generalized weakness; Recommend return to [facility] level for continued rehab;  Pt will benefit from skilled PT to increase their independence and safety with mobility to allow discharge to the venue listed below.       Follow Up Recommendations SNF    Equipment Recommendations  Rolling walker with 5" wheels;3in1 (PT)    Recommendations for Other Services       Precautions / Restrictions Precautions Precautions: Fall;Cervical      Mobility  Bed Mobility Overal bed mobility: Needs Assistance Bed Mobility: Supine to Sit     Supine to sit: Min assist     General bed mobility comments: Cues for technique, min assist to help square off hips at EOB  Transfers Overall transfer level: Needs assistance Equipment used: Rolling walker (2 wheeled) Transfers: Sit to/from Stand Sit to Stand: Min guard         General transfer comment: good rise; cues for hand poacement and to control descent from stand to sit  Ambulation/Gait Ambulation/Gait assistance: Min guard Ambulation Distance (Feet): 40 Feet Assistive device: Rolling walker (2 wheeled) Gait Pattern/deviations: Step-through pattern;Decreased step length - right;Decreased  step length - left     General Gait Details: Cues to self-monitor for activity tolerance  Stairs            Wheelchair Mobility    Modified Rankin (Stroke Patients Only)       Balance                                             Pertinent Vitals/Pain Pain Assessment: Faces Faces Pain Scale: Hurts even more Pain Location: Neck pain post activity Pain Descriptors / Indicators: Aching Pain Intervention(s): Monitored during session;Patient requesting pain meds-RN notified    Home Living Family/patient expects to be discharged to:: Skilled nursing facility                 Additional Comments: Back to SNF level at [facility] to continue rehab    Prior Function Level of Independence: Needs assistance   Gait / Transfers Assistance Needed: working on Gait with RW at rehab           Hand Dominance        Extremity/Trunk Assessment   Upper Extremity Assessment Upper Extremity Assessment: Generalized weakness; L hand pain due to IV, RN aware    Lower Extremity Assessment Lower Extremity Assessment: Generalized weakness       Communication   Communication: HOH  Cognition Arousal/Alertness: Awake/alert Behavior During Therapy: WFL for tasks assessed/performed Overall Cognitive Status: Within Functional Limits for tasks assessed  General Comments      Exercises     Assessment/Plan    PT Assessment Patient needs continued PT services  PT Problem List Decreased strength;Decreased range of motion;Decreased activity tolerance;Decreased balance;Decreased mobility;Decreased knowledge of use of DME;Decreased knowledge of precautions;Pain       PT Treatment Interventions DME instruction;Gait training;Functional mobility training;Therapeutic activities;Therapeutic exercise;Balance training;Patient/family education    PT Goals (Current goals can be found in the Care Plan section)   Acute Rehab PT Goals Patient Stated Goal: Would like to eat solid food PT Goal Formulation: With patient Time For Goal Achievement: 11/07/16 Potential to Achieve Goals: Good    Frequency Min 3X/week   Barriers to discharge        Co-evaluation               AM-PAC PT "6 Clicks" Daily Activity  Outcome Measure Difficulty turning over in bed (including adjusting bedclothes, sheets and blankets)?: A Lot Difficulty moving from lying on back to sitting on the side of the bed? : A Lot Difficulty sitting down on and standing up from a chair with arms (e.g., wheelchair, bedside commode, etc,.)?: A Little Help needed moving to and from a bed to chair (including a wheelchair)?: A Little Help needed walking in hospital room?: A Little Help needed climbing 3-5 steps with a railing? : A Lot 6 Click Score: 15    End of Session Equipment Utilized During Treatment: Cervical collar Activity Tolerance: Patient tolerated treatment well Patient left: in chair;with call bell/phone within reach;with nursing/sitter in room Nurse Communication: Mobility status PT Visit Diagnosis: Unsteadiness on feet (R26.81);Other abnormalities of gait and mobility (R26.89);Pain Pain - part of body:  (Neck)    Time: 6433-2951 PT Time Calculation (min) (ACUTE ONLY): 36 min   Charges:   PT Evaluation $PT Eval Moderate Complexity: 1 Mod PT Treatments $Gait Training: 8-22 mins   PT G Codes:        Roney [place], PT  Acute Rehabilitation Services Pager [phone] Office [phone]   [place] 10/24/2016, 12:15 PM

## 2016-10-24 NOTE — Consult Note (Signed)
Reason for Consult: Type II odontoid fracture Referring Physician: Dr. Leticia Penna Jamie Brown is an 83 y.o. female.  HPI: The patient is a 83 year old white female who fell about a month ago suffering a type II odontoid fracture. I was on call and contact by the ER doctor. I recommend she be placed in a hard collar and follow-up with me in the office. In fact the patient had an appointment today to see me.  In the meantime she has developed a GI bleed and was admitted. I'm asked to see the patient regarding her type II odontoid fracture.  Presently the patient is alert and pleasant. She denies neck pain. She doesn't like wearing the Aspen collar. She denies numbness tingling weakness etc.  Past Medical History:  Diagnosis Date  . Arthritis    OA  . Coronary artery disease   . GI bleeding 10/23/2016  . High cholesterol   . HTN (hypertension) 05/28/2015  . Hypertension   . Radiation 11/19/14-12/30/14   upper right neck 5000 cGy  . Squamous cell carcinoma     Past Surgical History:  Procedure Laterality Date  . BACK SURGERY    . CESAREAN SECTION    . ROTATOR CUFF REPAIR Bilateral     History reviewed. No pertinent family history.  Social History:  reports that she has never smoked. She has never used smokeless tobacco. She reports that she does not drink alcohol or use drugs.  Allergies:  Allergies  Allergen Reactions  . Codeine Itching    Hives  . Gabapentin Other (See Comments)    Difficulty finding words and foggy feeling  . Isoptin [Verapamil] Itching  . Remeron [Mirtazapine] Other (See Comments)    Muscle cramping and pain  . Trazodone And Nefazodone Other (See Comments)    Mental confusion  . Aldara [Imiquimod] Rash    Redness     Medications:  I have reviewed the patient's current medications. Prior to Admission:  Prescriptions Prior to Admission  Medication Sig Dispense Refill Last Dose  . acetaminophen (TYLENOL) 325 MG tablet Take 650 mg by mouth every 6 (six)  hours as needed for mild pain.   unk  . acetaminophen (TYLENOL) 500 MG tablet Take 1,000 mg by mouth 2 (two) times daily.   unk  . aspirin EC 81 MG tablet Take 81 mg by mouth daily.   unk  . bisacodyl (DULCOLAX) 10 MG suppository Place 10 mg rectally every 3 (three) days.   unk  . Calcium Carbonate-Vitamin D3 (CALCIUM 600-D) 600-400 MG-UNIT TABS Take 1 tablet by mouth daily.   unk  . cetirizine (ZYRTEC) 10 MG tablet Take 10 mg by mouth daily.   unk  . docusate sodium (STOOL SOFTENER) 100 MG capsule Take 100 mg by mouth daily as needed for mild constipation.   unk  . NIFEdipine (PROCARDIA XL/ADALAT-CC) 60 MG 24 hr tablet Take 60 mg by mouth daily.   unk  . nystatin (MYCOSTATIN/NYSTOP) powder Apply 1 g topically 3 (three) times daily.   unk  . ondansetron (ZOFRAN) 4 MG tablet Take 4 mg by mouth every 8 (eight) hours as needed for nausea or vomiting.   unk  . oxyCODONE (OXY IR/ROXICODONE) 5 MG immediate release tablet Take 5 mg by mouth every 4 (four) hours as needed for severe pain.   unk  . potassium chloride (K-DUR,KLOR-CON) 10 MEQ tablet Take 10 mEq by mouth daily with breakfast.   unk  . predniSONE (DELTASONE) 2.5 MG tablet Take 2.5 mg  by mouth daily with breakfast.   unk  . sertraline (ZOLOFT) 50 MG tablet Take 50 mg by mouth daily with breakfast.   unk  . Tafluprost (ZIOPTAN) 0.0015 % SOLN Place 1 drop into both eyes at bedtime.   unk  . tiZANidine (ZANAFLEX) 2 MG tablet Take 2 mg by mouth every 8 (eight) hours as needed for muscle spasms.   unk  . torsemide (DEMADEX) 10 MG tablet Take 10-30 mg by mouth See admin instructions. 10 mg four times a week, 30 mg three times a week   unk  . zolpidem (AMBIEN) 10 MG tablet Take 10 mg by mouth at bedtime.   unk   Scheduled: . cholecalciferol  2,000 Units Oral QHS  . Influenza vac split quadrivalent PF  0.5 mL Intramuscular Tomorrow-1000  . latanoprost  1 drop Both Eyes QHS  . loratadine  10 mg Oral Daily  . NIFEdipine  60 mg Oral Daily  . omega-3  acid ethyl esters  1 g Oral Daily  . [START ON 10/26/2016] pantoprazole  40 mg Intravenous Q12H  . potassium chloride  10 mEq Oral Q breakfast  . predniSONE  2.5 mg Oral Q breakfast  . sertraline  50 mg Oral Q breakfast  . sodium chloride flush  3 mL Intravenous Q12H  . sodium chloride flush  3 mL Intravenous Q12H  . zolpidem  5 mg Oral QHS   Continuous: . sodium chloride    . pantoprozole (PROTONIX) infusion 8 mg/hr (10/24/16 0459)   UVO:ZDGUYQ chloride, acetaminophen **OR** acetaminophen, diphenhydrAMINE, LORazepam, morphine injection, ondansetron **OR** ondansetron (ZOFRAN) IV, sodium chloride flush Anti-infectives    None       Results for orders placed or performed during the hospital encounter of 10/22/16 (from the past 48 hour(s))  Comprehensive metabolic panel     Status: Abnormal   Collection Time: 10/22/16  8:13 PM  Result Value Ref Range   Sodium 136 135 - 145 mmol/L   Potassium 3.3 (L) 3.5 - 5.1 mmol/L   Chloride 98 (L) 101 - 111 mmol/L   CO2 26 22 - 32 mmol/L   Glucose, Bld 124 (H) 65 - 99 mg/dL   BUN 18 6 - 20 mg/dL   Creatinine, Ser 0.88 0.44 - 1.00 mg/dL   Calcium 8.8 (L) 8.9 - 10.3 mg/dL   Total Protein 6.1 (L) 6.5 - 8.1 g/dL   Albumin 3.4 (L) 3.5 - 5.0 g/dL   AST 24 15 - 41 U/L   ALT 18 14 - 54 U/L   Alkaline Phosphatase 64 38 - 126 U/L   Total Bilirubin 0.4 0.3 - 1.2 mg/dL   GFR calc non Af Amer 56 (L) >60 mL/min   GFR calc Af Amer >60 >60 mL/min    Comment: (NOTE) The eGFR has been calculated using the CKD EPI equation. This calculation has not been validated in all clinical situations. eGFR's persistently <60 mL/min signify possible Chronic Kidney Disease.    Anion gap 12 5 - 15  CBC     Status: Abnormal   Collection Time: 10/22/16  8:13 PM  Result Value Ref Range   WBC 11.8 (H) 4.0 - 10.5 K/uL   RBC 2.53 (L) 3.87 - 5.11 MIL/uL   Hemoglobin 6.9 (LL) 12.0 - 15.0 g/dL    Comment: REPEATED TO VERIFY CRITICAL RESULT CALLED TO, READ BACK BY AND  VERIFIED WITH: C.HAMANN,RN [id number] 10/22/16 M.CAMPBELL    HCT 21.8 (L) 36.0 - 46.0 %   MCV 86.2 78.0 -  100.0 fL   MCH 27.3 26.0 - 34.0 pg   MCHC 31.7 30.0 - 36.0 g/dL   RDW 15.5 11.5 - 15.5 %   Platelets 377 150 - 400 K/uL  Type and screen [place]     Status: None (Preliminary result)   Collection Time: 10/22/16  8:15 PM  Result Value Ref Range   ABO/RH(D) A POS    Antibody Screen NEG    Sample Expiration 10/25/2016    Unit Number [id number]    Blood Component Type RED CELLS,LR    Unit division 00    Status of Unit ISSUED,FINAL    Transfusion Status OK TO TRANSFUSE    Crossmatch Result Compatible    Unit Number [id number]    Blood Component Type RBC, LR IRR    Unit division 00    Status of Unit ISSUED    Transfusion Status OK TO TRANSFUSE    Crossmatch Result Compatible   ABO/Rh     Status: None   Collection Time: 10/22/16  8:15 PM  Result Value Ref Range   ABO/RH(D) A POS   Urinalysis, Complete w Microscopic     Status: Abnormal   Collection Time: 10/22/16  8:37 PM  Result Value Ref Range   Color, Urine YELLOW YELLOW   APPearance CLEAR CLEAR   Specific Gravity, Urine 1.010 1.005 - 1.030   pH 7.0 5.0 - 8.0   Glucose, UA NEGATIVE NEGATIVE mg/dL   Hgb urine dipstick NEGATIVE NEGATIVE   Bilirubin Urine NEGATIVE NEGATIVE   Ketones, ur NEGATIVE NEGATIVE mg/dL   Protein, ur NEGATIVE NEGATIVE mg/dL   Nitrite POSITIVE (A) NEGATIVE   Leukocytes, UA MODERATE (A) NEGATIVE   RBC / HPF 0-5 0 - 5 RBC/hpf   WBC, UA 6-30 0 - 5 WBC/hpf   Bacteria, UA RARE (A) NONE SEEN   Squamous Epithelial / LPF NONE SEEN NONE SEEN  Protime-INR     Status: None   Collection Time: 10/22/16  8:53 PM  Result Value Ref Range   Prothrombin Time 13.5 11.4 - 15.2 seconds   INR 1.04   POC occult blood, ED     Status: Abnormal   Collection Time: 10/22/16  9:29 PM  Result Value Ref Range   Fecal Occult Bld POSITIVE (A) NEGATIVE  Prepare RBC     Status: None   Collection Time:  10/22/16  9:29 PM  Result Value Ref Range   Order Confirmation ORDER PROCESSED BY BLOOD BANK   Basic metabolic panel     Status: Abnormal   Collection Time: 10/23/16  2:52 AM  Result Value Ref Range   Sodium 140 135 - 145 mmol/L   Potassium 3.4 (L) 3.5 - 5.1 mmol/L   Chloride 109 101 - 111 mmol/L   CO2 24 22 - 32 mmol/L   Glucose, Bld 105 (H) 65 - 99 mg/dL   BUN 16 6 - 20 mg/dL   Creatinine, Ser 0.81 0.44 - 1.00 mg/dL   Calcium 7.6 (L) 8.9 - 10.3 mg/dL   GFR calc non Af Amer >60 >60 mL/min   GFR calc Af Amer >60 >60 mL/min    Comment: (NOTE) The eGFR has been calculated using the CKD EPI equation. This calculation has not been validated in all clinical situations. eGFR's persistently <60 mL/min signify possible Chronic Kidney Disease.    Anion gap 7 5 - 15  Hemoglobin and hematocrit, blood     Status: Abnormal   Collection Time: 10/23/16  2:52 AM  Result Value Ref Range   Hemoglobin 6.5 (LL) 12.0 - 15.0 g/dL    Comment: REPEATED TO VERIFY CRITICAL VALUE NOTED.  VALUE IS CONSISTENT WITH PREVIOUSLY REPORTED AND CALLED VALUE.    HCT 19.9 (L) 36.0 - 46.0 %  Hemoglobin and hematocrit, blood     Status: Abnormal   Collection Time: 10/23/16  8:02 AM  Result Value Ref Range   Hemoglobin 7.8 (L) 12.0 - 15.0 g/dL   HCT 24.1 (L) 36.0 - 46.0 %  Prepare RBC     Status: None   Collection Time: 10/23/16  8:43 AM  Result Value Ref Range   Order Confirmation ORDER PROCESSED BY BLOOD BANK   Hemoglobin and hematocrit, blood     Status: Abnormal   Collection Time: 10/23/16  4:31 PM  Result Value Ref Range   Hemoglobin 9.4 (L) 12.0 - 15.0 g/dL   HCT 29.1 (L) 36.0 - 46.0 %  MRSA PCR Screening     Status: Abnormal   Collection Time: 10/23/16  4:37 PM  Result Value Ref Range   MRSA by PCR POSITIVE (A) NEGATIVE    Comment:        The GeneXpert MRSA Assay (FDA approved for NASAL specimens only), is one component of a comprehensive MRSA colonization surveillance program. It is  not intended to diagnose MRSA infection nor to guide or monitor treatment for MRSA infections. RESULT CALLED TO, READ BACK BY AND VERIFIED WITH: C MARSHALL RN 2258 10/23/16 A BROWNING   CBC     Status: Abnormal   Collection Time: 10/24/16  4:05 AM  Result Value Ref Range   WBC 11.9 (H) 4.0 - 10.5 K/uL   RBC 3.43 (L) 3.87 - 5.11 MIL/uL   Hemoglobin 9.4 (L) 12.0 - 15.0 g/dL   HCT 29.1 (L) 36.0 - 46.0 %   MCV 84.8 78.0 - 100.0 fL   MCH 27.4 26.0 - 34.0 pg   MCHC 32.3 30.0 - 36.0 g/dL   RDW 16.2 (H) 11.5 - 15.5 %   Platelets 341 150 - 400 K/uL  Basic metabolic panel     Status: Abnormal   Collection Time: 10/24/16  4:05 AM  Result Value Ref Range   Sodium 139 135 - 145 mmol/L   Potassium 3.5 3.5 - 5.1 mmol/L   Chloride 107 101 - 111 mmol/L   CO2 25 22 - 32 mmol/L   Glucose, Bld 103 (H) 65 - 99 mg/dL   BUN 10 6 - 20 mg/dL   Creatinine, Ser 0.76 0.44 - 1.00 mg/dL   Calcium 8.8 (L) 8.9 - 10.3 mg/dL   GFR calc non Af Amer >60 >60 mL/min   GFR calc Af Amer >60 >60 mL/min    Comment: (NOTE) The eGFR has been calculated using the CKD EPI equation. This calculation has not been validated in all clinical situations. eGFR's persistently <60 mL/min signify possible Chronic Kidney Disease.    Anion gap 7 5 - 15    No results found.  ROS: As above. Blood pressure 132/86, pulse 71, temperature 98.2 F (36.8 C), temperature source Oral, resp. rate 18, height _0  (1.549 m), weight 72.7 kg (160 lb 4.8 oz), SpO2 96 %. Physical Exam  General: An alert and pleasant 83 year old white female wearing an Aspen collar in no apparent distress  HEENT: Normocephalic, atraumatic, extraocular muscles are intact.  Neck: No obvious deformities. No tenderness. She is wearing an [employer].  Thorax: Symmetric  Abdomen: Soft  Extremities: Unremarkable  Neurologic exam:  The patient is alert and oriented 3. Cranial nerves II through XII were examined bilaterally and grossly normal except for  some presbycusis. Her motor strength is 5 over 5 in her bilateral deltoid, bicep, tricep, hand grip, gastrocnemius, dorsiflexors. Sensation is intact to light touch sensation in all tested dermatomes bilaterally. Cerebellar function is intact to rapid alternating movements of the upper extremities bilaterally.  Imaging studies: I reviewed the patient's cervical CT performed at [facility] on 09/22/2016. She has a nondisplaced type II odontoid fracture and some diffuse degenerative changes.  Assessment/Plan: Type II odontoid fracture: I have discussed the situation with the patient. At her age she is not a good surgical candidate and therefore I have recommended she wear a hard collar for approximately 12 weeks. At that point she can follow-up with me in the office for flexion-extension x-rays. I doubt she'll heal this fracture, but hopefully at that point it'll be stable enough to remove the collar. I have answered all her questions. Please have her follow-up with me in the office in about 8 weeks. Please call if I can be of further assistance.  Debbie Yearick D 10/24/2016, 7:17 AM

## 2016-10-24 NOTE — Progress Notes (Signed)
Subjective: No abdominal pain. Tolerating clear liquid diet. No blood in stool.  Objective: Vital signs in last 24 hours: Temp:  [98.1 F (36.7 C)-98.6 F (37 C)] 98.1 F (36.7 C) (10/02 0958) Pulse Rate:  [71-95] 95 (10/02 0958) Resp:  [18] 18 (10/02 0958) BP: (119-144)/(47-86) 119/47 (10/02 0958) SpO2:  [95 %-98 %] 95 % (10/02 0958) Weight:  [160 lb 4.8 oz (72.7 kg)] 160 lb 4.8 oz (72.7 kg) (10/01 2043) Weight change: 10 lb 4.8 oz (4.672 kg) Last BM Date: 10/21/16  PE: GEN:  Neck brace in place, younger-appearing than stated age, NAD ABD:  Soft, non-tender  Lab Results: CBC    Component Value Date/Time   WBC 11.9 (H) 10/24/2016 0405   RBC 3.43 (L) 10/24/2016 0405   HGB 9.4 (L) 10/24/2016 0405   HCT 29.1 (L) 10/24/2016 0405   PLT 341 10/24/2016 0405   MCV 84.8 10/24/2016 0405   MCH 27.4 10/24/2016 0405   MCHC 32.3 10/24/2016 0405   RDW 16.2 (H) 10/24/2016 0405   LYMPHSABS 1.5 09/22/2016 1115   MONOABS 1.0 09/22/2016 1115   EOSABS 0.0 09/22/2016 1115   BASOSABS 0.0 09/22/2016 1115   CMP     Component Value Date/Time   NA 139 10/24/2016 0405   K 3.5 10/24/2016 0405   CL 107 10/24/2016 0405   CO2 25 10/24/2016 0405   GLUCOSE 103 (H) 10/24/2016 0405   BUN 10 10/24/2016 0405   BUN 14.1 07/25/2016 1040   CREATININE 0.76 10/24/2016 0405   CREATININE 0.9 07/25/2016 1040   CALCIUM 8.8 (L) 10/24/2016 0405   PROT 6.1 (L) 10/22/2016 2013   ALBUMIN 3.4 (L) 10/22/2016 2013   AST 24 10/22/2016 2013   ALT 18 10/22/2016 2013   ALKPHOS 64 10/22/2016 2013   BILITOT 0.4 10/22/2016 2013   GFRNONAA >60 10/24/2016 0405   GFRAA >60 10/24/2016 0405    Assessment:  1.  GI bleeding, UGI versus LGI.  Normal BUN, but ASA and steroids without PPI.  Not destabilizing, no overt bleeding for the past 24 hours. 2.  Recent fall (unrelated to current bleeding), with cervical spinal injury (dens fracture) and neck collar in place. 3.  Acute blood loss anemia.  Hgb stable after blood  transfusion yesterday.  Plan:  1.  IV PPI another 24 hours, then consider transition to PO. 2.  Advance diet. 3.  In absence of overt/destabilizing bleeding, will treat patient's GI bleed medically and try to avoid endoscopic evaluation (given patient's age and cervical vertebral fracture). 4.  Will follow.   Jamie Brown 10/24/2016, 12:12 PM   Cell [phone] If no answer or after 5 PM call [phone]

## 2016-10-24 NOTE — Clinical Social Work Note (Signed)
Clinical Social Work Assessment  Patient Details  Name: Jamie Brown MRN: [id number] Date of Birth: [birth year]  Date of referral:  10/24/16               Reason for consult:  Facility Placement                Permission sought to share information with:  Facility Sport and exercise psychologist, Family Supports Permission granted to share information::  Yes, Verbal Permission Granted  Name::     Jamie Brown  Agency::  [facility]  Relationship::  daughter  Contact Information:  [phone]  Housing/Transportation Living arrangements for the past 2 months:  [place], [place] of Information:  Patient, Adult Children Patient Interpreter Needed:  None Criminal Activity/Legal Involvement Pertinent to Current Situation/Hospitalization:  No - Comment as needed Significant Relationships:  Adult Children Lives with:  Facility Resident Do you feel safe going back to the place where you live?  Yes Need for family participation in patient care:  No (Coment)  Care giving concerns: Patient from [facility]; before that was in [employer].    Social Worker assessment / plan: CSW met with patient and family at bedside. Patient had fall and neck injury a month ago and since then has been in [facility]. Plan to return to SNF. CSW to follow up with SNF and confirm patient's bed there. CSW to support with discharge.  Employment status:  Retired Forensic scientist:  Medicare PT Recommendations:  [place] / Referral to community resources:  [place]  Patient/Family's Response to care: Patient and family appreciative of care.  Patient/Family's Understanding of and Emotional Response to Diagnosis, Current Treatment, and Prognosis: Patient and family with good understanding of condition and prognosis.  Emotional Assessment Appearance:  Appears stated age Attitude/Demeanor/Rapport:  Other  (appropriate) Affect (typically observed):  Calm Orientation:  Oriented to Self, Oriented to Place, Oriented to Situation Alcohol / Substance use:  Not Applicable Psych involvement (Current and /or in the community):  No (Comment)  Discharge Needs  Concerns to be addressed:  Discharge Planning Concerns Readmission within the last 30 days:  Yes Current discharge risk:  Physical Impairment Barriers to Discharge:  Continued Medical Work up   Estanislado Emms, LCSW 10/24/2016, 4:36 PM

## 2016-10-24 NOTE — Progress Notes (Signed)
PROGRESS NOTE    Jamie Brown  XHB:[id number] DOB: 1924/09/11 DOA: 10/22/2016 PCP: Javier Glazier, MD   Outpatient Specialists:     Brief Narrative:  Jamie Brown is a 83 y.o. 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 Patient reportedly fell approximately 1 month ago, was found to have suffered a dense fracture, has been kept in a cervical collar, and has follow-up with neurosurgery scheduled for 10/24/2016. She had been doing well until 10/19/2016, when she began to complain of feeling unwell, noting that she was having abdominal pain and nausea, and had an episode of emesis that was unwitnessed, but dark  vomit was noted on her cervical collar. The following day, she began to have loose black stools. Today, stools have been formed, but continue to be black. She is not anticoagulated. Advil is on her medicine list, but family does not believe she has been taking it. She does take an aspirin 81 mg and has been on a low-dose prednisone as well. She does not take H2 blocker or PPI. Denies any history of GI bleeding. Denies chest pain, headache, or confusion.   Assessment & Plan:   Principal Problem:   Acute upper GI bleed Active Problems:   Squamous cell carcinoma of skin of neck   HTN (hypertension)   Anemia   Coronary artery disease   Dens fracture (HCC)   Hypokalemia   Acute GI bleeding   upper GIB, symptomatic anemia  - Pt presents with mild abd pain, nausea, one episode of dark emesis on 9/27, followed by melena  - Hgb found to be 6.9 on admission, down from 13.5 one month earlier-- given 2 units PRBC and now stable at 9.4 - FOBT is positive  - GI is consulting and much appreciated: PPI  gtt and then PPI for prolonged period after -patient does have back pain which is chronic-- no sign of flank bruising or retroperitoneal bleed  Dens fracture - Pt suffered dens fracture 1 month ago and has been in cervical collar since  - Dr. Arnoldo Morale follow up appreciated: hard collar for approximately 12 weeks. At that point she can follow-up with me in the office for flexion-extension x-rays. I doubt she'll heal this fracture, but hopefully at that point it'll be stable enough to remove the collar.  Hypertension  - BP is at goal  - Continue nifedipine    Hypokalemia  - replete  Fall x 2 -? From anemia -PT eval    Depression, anxiety, insomnia  - Stable  - Continue Zoloft, Ativan, Ambien   ? UTI- gram negative rods -final culture urine pending -IV rocephin    DVT prophylaxis:  SCD's  Code Status: Full Code   Family Communication:   Disposition Plan:     Consultants:   GI  Neurosurgery     Subjective: Bed alarm went off overnight when ever she would move  Objective: Vitals:   10/23/16 1758 10/23/16 2043 10/24/16 0516 10/24/16 0958  BP: (!) 138/51 (!) 144/51 132/86 (!) 119/47  Pulse: 77 85 71 95  Resp: 18 18 18 18   Temp: 98.6 F (37 C) 98.3 F (36.8 C) 98.2 F (36.8 C) 98.1 F (36.7 C)  TempSrc: Oral Oral Oral Oral  SpO2: 98% 98%  96% 95%  Weight:  72.7 kg (160 lb 4.8 oz)    Height:        Intake/Output Summary (Last 24 hours) at 10/24/16 1207 Last data filed at 10/24/16 1049  Gross per 24 hour  Intake          1713.08 ml  Output             2500 ml  Net          -786.92 ml   Filed Weights   10/22/16 2008 10/23/16 2043  Weight: 68 kg (150 lb) 72.7 kg (160 lb 4.8 oz)    Examination:  General exam: in bed, NAD Respiratory system: clear Cardiovascular system: rrr Gastrointestinal system: +Bs, soft Central nervous system: hard of hearing, no neuro deficits Extremities: moves all 4 ext     Data Reviewed: I have personally  reviewed following labs and imaging studies  CBC:  Recent Labs Lab 10/22/16 2013 10/23/16 0252 10/23/16 0802 10/23/16 1631 10/24/16 0405  WBC 11.8*  --   --   --  11.9*  HGB 6.9* 6.5* 7.8* 9.4* 9.4*  HCT 21.8* 19.9* 24.1* 29.1* 29.1*  MCV 86.2  --   --   --  84.8  PLT 377  --   --   --  259   Basic Metabolic Panel:  Recent Labs Lab 10/22/16 2013 10/23/16 0252 10/24/16 0405  NA 136 140 139  K 3.3* 3.4* 3.5  CL 98* 109 107  CO2 26 24 25   GLUCOSE 124* 105* 103*  BUN 18 16 10   CREATININE 0.88 0.81 0.76  CALCIUM 8.8* 7.6* 8.8*   GFR: Estimated Creatinine Clearance: 42.6 mL/min (by C-G formula based on SCr of 0.76 mg/dL). Liver Function Tests:  Recent Labs Lab 10/22/16 2013  AST 24  ALT 18  ALKPHOS 64  BILITOT 0.4  PROT 6.1*  ALBUMIN 3.4*   No results for input(s): LIPASE, AMYLASE in the last 168 hours. No results for input(s): AMMONIA in the last 168 hours. Coagulation Profile:  Recent Labs Lab 10/22/16 2053  INR 1.04   Cardiac Enzymes: No results for input(s): CKTOTAL, CKMB, CKMBINDEX, TROPONINI in the last 168 hours. BNP (last 3 results) No results for input(s): PROBNP in the last 8760 hours. HbA1C: No results for input(s): HGBA1C in the last 72 hours. CBG: No results for input(s): GLUCAP in the last 168 hours. Lipid Profile: No results for input(s): CHOL, HDL, LDLCALC, TRIG, CHOLHDL, LDLDIRECT in the last 72 hours. Thyroid Function Tests: No results for input(s): TSH, T4TOTAL, FREET4, T3FREE, THYROIDAB in the last 72 hours. Anemia Panel: No results for input(s): VITAMINB12, FOLATE, FERRITIN, TIBC, IRON, RETICCTPCT in the last 72 hours. Urine analysis:    Component Value Date/Time   COLORURINE YELLOW 10/22/2016 2037   APPEARANCEUR CLEAR 10/22/2016 2037   LABSPEC 1.010 10/22/2016 2037   PHURINE 7.0 10/22/2016 2037   GLUCOSEU NEGATIVE 10/22/2016 2037   HGBUR NEGATIVE 10/22/2016 2037   BILIRUBINUR NEGATIVE 10/22/2016 2037   KETONESUR NEGATIVE  10/22/2016 2037   PROTEINUR NEGATIVE 10/22/2016 2037   UROBILINOGEN 0.2 08/23/2006 2210   NITRITE POSITIVE (A) 10/22/2016 2037   LEUKOCYTESUR MODERATE (A) 10/22/2016 2037     ) Recent Results (from the past 240 hour(s))  Culture, Urine     Status: Abnormal (Preliminary result)   Collection Time: 10/22/16  8:37 PM  Result Value Ref Range Status   Specimen Description URINE, CATHETERIZED  Final   Special Requests NONE  Final   Culture >=100,000  COLONIES/mL GRAM NEGATIVE RODS (A)  Final   Report Status PENDING  Incomplete  MRSA PCR Screening     Status: Abnormal   Collection Time: 10/23/16  4:37 PM  Result Value Ref Range Status   MRSA by PCR POSITIVE (A) NEGATIVE Final    Comment:        The GeneXpert MRSA Assay (FDA approved for NASAL specimens only), is one component of a comprehensive MRSA colonization surveillance program. It is not intended to diagnose MRSA infection nor to guide or monitor treatment for MRSA infections. RESULT CALLED TO, READ BACK BY AND VERIFIED WITHLoletha Grayer MARSHALL RN [phone] 10/23/16 A BROWNING       Anti-infectives    None       Radiology Studies: No results found.      Scheduled Meds: . cholecalciferol  2,000 Units Oral QHS  . latanoprost  1 drop Both Eyes QHS  . loratadine  10 mg Oral Daily  . NIFEdipine  60 mg Oral Daily  . omega-3 acid ethyl esters  1 g Oral Daily  . [START ON 10/26/2016] pantoprazole  40 mg Intravenous Q12H  . polyethylene glycol  17 g Oral Daily  . potassium chloride  10 mEq Oral Q breakfast  . predniSONE  2.5 mg Oral Q breakfast  . sertraline  50 mg Oral Q breakfast  . sodium chloride flush  3 mL Intravenous Q12H  . sodium chloride flush  3 mL Intravenous Q12H  . zolpidem  5 mg Oral QHS   Continuous Infusions: . sodium chloride    . pantoprozole (PROTONIX) infusion 8 mg/hr (10/24/16 0459)     LOS: 2 days    Time spent: 25 min    [place], DO [facility] Pager [phone]  If 7PM-7AM,  please contact night-coverage [email or web address] Password [email or web address] 10/24/2016, 12:07 PM

## 2016-10-25 ENCOUNTER — Inpatient Hospital Stay (HOSPITAL_COMMUNITY): Payer: Medicare Other

## 2016-10-25 LAB — BASIC METABOLIC PANEL
ANION GAP: 8 (ref 5–15)
BUN: 8 mg/dL (ref 6–20)
CALCIUM: 8.7 mg/dL — AB (ref 8.9–10.3)
CHLORIDE: 105 mmol/L (ref 101–111)
CO2: 25 mmol/L (ref 22–32)
CREATININE: 0.83 mg/dL (ref 0.44–1.00)
GFR calc non Af Amer: >60 mL/min (ref >60)
GLUCOSE: 112 mg/dL — AB (ref 65–99)
Potassium: 3.5 mmol/L (ref 3.5–5.1)
SODIUM: 138 mmol/L (ref 135–145)

## 2016-10-25 LAB — URINE CULTURE

## 2016-10-25 LAB — CBC
HCT: 25.4 % — ABNORMAL LOW (ref 36.0–46.0)
Hemoglobin: 8.3 g/dL — ABNORMAL LOW (ref 12.0–15.0)
MCH: 28.4 pg (ref 26.0–34.0)
MCHC: 32.7 g/dL (ref 30.0–36.0)
MCV: 87 fL (ref 78.0–100.0)
PLATELETS: 296 10*3/uL (ref 150–400)
RBC: 2.92 MIL/uL — AB (ref 3.87–5.11)
RDW: 16.7 % — ABNORMAL HIGH (ref 11.5–15.5)
WBC: 9.7 10*3/uL (ref 4.0–10.5)

## 2016-10-25 LAB — HEMOGLOBIN AND HEMATOCRIT, BLOOD
HEMATOCRIT: 30 % — AB (ref 36.0–46.0)
Hemoglobin: 9.6 g/dL — ABNORMAL LOW (ref 12.0–15.0)

## 2016-10-25 MED ORDER — MUPIROCIN 2 % EX OINT
1.0000 "application " | TOPICAL_OINTMENT | Freq: Two times a day (BID) | CUTANEOUS | Status: DC
  Administered 2016-10-25 – 2016-10-26 (×3): 1 via NASAL
  Filled 2016-10-25 (×3): qty 22

## 2016-10-25 MED ORDER — CEFUROXIME AXETIL 500 MG PO TABS
500.0000 mg | ORAL_TABLET | Freq: Two times a day (BID) | ORAL | Status: DC
  Administered 2016-10-25 – 2016-10-26 (×2): 500 mg via ORAL
  Filled 2016-10-25 (×2): qty 1

## 2016-10-25 MED ORDER — PANTOPRAZOLE SODIUM 40 MG PO TBEC
40.0000 mg | DELAYED_RELEASE_TABLET | Freq: Two times a day (BID) | ORAL | Status: DC
  Administered 2016-10-25 – 2016-10-26 (×2): 40 mg via ORAL
  Filled 2016-10-25 (×2): qty 1

## 2016-10-25 MED ORDER — CHLORHEXIDINE GLUCONATE CLOTH 2 % EX PADS
6.0000 | MEDICATED_PAD | Freq: Every day | CUTANEOUS | Status: DC
  Administered 2016-10-26: 6 via TOPICAL

## 2016-10-25 NOTE — Progress Notes (Signed)
Patients daughter is at bedside and would like to speak with the MD. MD notified. Will continue to monitor.

## 2016-10-25 NOTE — Progress Notes (Signed)
Subjective: No overt bleeding. 1-gram drop Hgb overnight. Denies abdominal pain.  Objective: Vital signs in last 24 hours: Temp:  [98 F (36.7 C)-98.4 F (36.9 C)] 98.4 F (36.9 C) (10/03 0839) Pulse Rate:  [75-98] 79 (10/03 0839) Resp:  [18] 18 (10/03 0839) BP: (119-134)/(47-53) 134/52 (10/03 0839) SpO2:  [95 %-96 %] 95 % (10/03 0839) Weight:  [134 lb 7.7 oz (61 kg)] 134 lb 7.7 oz (61 kg) (10/02 2117) Weight change: -25 lb 13.1 oz (-11.712 kg) Last BM Date: 10/21/16  PE: GEN:  NAD HEENT:  Neck brace in place ABD:  Soft, non-tender  Lab Results: CBC    Component Value Date/Time   WBC 9.7 10/25/2016 0228   RBC 2.92 (L) 10/25/2016 0228   HGB 8.3 (L) 10/25/2016 0228   HCT 25.4 (L) 10/25/2016 0228   PLT 296 10/25/2016 0228   MCV 87.0 10/25/2016 0228   MCH 28.4 10/25/2016 0228   MCHC 32.7 10/25/2016 0228   RDW 16.7 (H) 10/25/2016 0228   LYMPHSABS 1.5 09/22/2016 1115   MONOABS 1.0 09/22/2016 1115   EOSABS 0.0 09/22/2016 1115   BASOSABS 0.0 09/22/2016 1115   CMP     Component Value Date/Time   NA 138 10/25/2016 0228   K 3.5 10/25/2016 0228   CL 105 10/25/2016 0228   CO2 25 10/25/2016 0228   GLUCOSE 112 (H) 10/25/2016 0228   BUN 8 10/25/2016 0228   BUN 14.1 07/25/2016 1040   CREATININE 0.83 10/25/2016 0228   CREATININE 0.9 07/25/2016 1040   CALCIUM 8.7 (L) 10/25/2016 0228   PROT 6.1 (L) 10/22/2016 2013   ALBUMIN 3.4 (L) 10/22/2016 2013   AST 24 10/22/2016 2013   ALT 18 10/22/2016 2013   ALKPHOS 64 10/22/2016 2013   BILITOT 0.4 10/22/2016 2013   GFRNONAA >60 10/25/2016 0228   GFRAA >60 10/25/2016 0228   Assessment:  1. GI bleeding, UGI versus LGI. Normal BUN, but ASA and steroids without PPI. Not destabilizing, no overt bleeding for the past 48 hours. 2. Recent fall (unrelated to current bleeding), with cervical spinal injury (dens fracture) and neck collar in place. 3. Acute blood loss anemia.  Hgb drop one gram without overt bleeding.  Plan:  1.   CT planned to assess for retroperitoneal bleeding. 2.  OK from GI perspective to advance diet. 3.  After CT scan, ok to transition to oral PPI. 4.  Will follow.   Landry Dyke 10/25/2016, 9:53 AM   Cell [phone] If no answer or after 5 PM call [phone]

## 2016-10-25 NOTE — Progress Notes (Addendum)
Physical Therapy Treatment Patient Details Name: MARIETA MARKOV MRN: [id number] DOB: 1925/01/02 Today's Date: 10/25/2016    History of Present Illness [place] AMBREE FRANCES is a 83 y.o. 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    PT Comments    Continuing work on functional mobility and activity tolerance;  Able to incr amb distance, and overall looking more robust today; Noted some DOE (consistent with lower Hgb); She was happy to be able to eat solid food today;   Would definitely recommend re-starting PT services at SNF  Follow Up Recommendations  SNF     Equipment Recommendations  Rolling walker with 5" wheels;3in1 (PT)    Recommendations for Other Services       Precautions / Restrictions Precautions Precautions: Fall;Cervical    Mobility  Bed Mobility Overal bed mobility: Needs Assistance Bed Mobility: Rolling;Sidelying to Sit Rolling: Min guard Sidelying to sit: Min assist       General bed mobility comments: Cues for technique, min assist to elevate trunk and  help square off hips at EOB  Transfers Overall transfer level: Needs assistance Equipment used: Rolling walker (2 wheeled) Transfers: Sit to/from Stand Sit to Stand: Min guard         General transfer comment: good rise; cues for hand placement and to control descent from stand to sit  Ambulation/Gait Ambulation/Gait assistance: Min guard Ambulation Distance (Feet): 80 Feet Assistive device: Rolling walker (2 wheeled) Gait Pattern/deviations: Step-through pattern;Decreased step length - right;Decreased step length - left     General Gait Details: Cues to self-monitor for activity tolerance; DOE 3/4 with walk on  Room Air; O2 sats 97% after seated rest post amb   Stairs            Wheelchair Mobility    Modified Rankin (Stroke Patients Only)       Balance                                            Cognition Arousal/Alertness: Awake/alert Behavior During Therapy: WFL for tasks assessed/performed Overall Cognitive Status: Within Functional Limits for tasks assessed                                        Exercises      General Comments        Pertinent Vitals/Pain Pain Assessment: Faces Faces Pain Scale: Hurts even more Pain Location: Neck pain post activity Pain Descriptors / Indicators: Aching Pain Intervention(s): Monitored during session;Repositioned    Home Living                      Prior Function            PT Goals (current goals can now be found in the care plan section) Acute Rehab PT Goals Patient Stated Goal: Hopes to be back to [facility] soon PT Goal Formulation: With patient Time For Goal Achievement: 11/07/16 Potential to Achieve Goals: Good Progress towards PT goals: Progressing toward goals    Frequency    Min  3X/week      PT Plan Current plan remains appropriate    Co-evaluation              AM-PAC PT "6 Clicks" Daily Activity  Outcome Measure  Difficulty turning over in bed (including adjusting bedclothes, sheets and blankets)?: A Little Difficulty moving from lying on back to sitting on the side of the bed? : Unable Difficulty sitting down on and standing up from a chair with arms (e.g., wheelchair, bedside commode, etc,.)?: None Help needed moving to and from a bed to chair (including a wheelchair)?: None Help needed walking in hospital room?: A Little Help needed climbing 3-5 steps with a railing? : A Lot 6 Click Score: 17    End of Session Equipment Utilized During Treatment: Cervical collar Activity Tolerance: Patient tolerated treatment well Patient left: in chair;with  call bell/phone within reach;with nursing/sitter in room Nurse Communication: Mobility status PT Visit Diagnosis: Unsteadiness on feet (R26.81);Other abnormalities of gait and mobility (R26.89);Pain Pain - part of body:  (Neck)     Time: 2863-8177 PT Time Calculation (min) (ACUTE ONLY): 28 min  Charges:  $Gait Training: 23-37 mins                    G Codes:       Roney Marion, PT  Acute Rehabilitation Services Pager [phone] Office [place] 10/25/2016, 4:27 PM

## 2016-10-25 NOTE — Progress Notes (Signed)
Patient currently has no diet order. MD notified. Order placed. Will continue to monitor.

## 2016-10-25 NOTE — Progress Notes (Signed)
PROGRESS NOTE    Jamie Brown  TGG:[id number] DOB: 25-Nov-1924 DOA: 10/22/2016 PCP: Javier Glazier, MD   Outpatient Specialists:     Brief Narrative:  Jamie Brown is a 83 y.o. 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 Patient reportedly fell approximately 1 month ago, was found to have suffered a dense fracture, has been kept in a cervical collar, and has follow-up with neurosurgery scheduled for 10/24/2016. She had been doing well until 10/19/2016, when she began to complain of feeling unwell, noting that she was having abdominal pain and nausea, and had an episode of emesis that was unwitnessed, but dark  vomit was noted on her cervical collar. The following day, she began to have loose black stools. Today, stools have been formed, but continue to be black. She is not anticoagulated. Advil is on her medicine list, but family does not believe she has been taking it. She does take an aspirin 81 mg and has been on a low-dose prednisone as well. She does not take H2 blocker or PPI. Denies any history of GI bleeding. Denies chest pain, headache, or confusion.   Assessment & Plan:   Principal Problem:   Acute upper GI bleed Active Problems:   Squamous cell carcinoma of skin of neck   HTN (hypertension)   Anemia   Coronary artery disease   Dens fracture (HCC)   Hypokalemia   Acute GI bleeding   upper GIB, symptomatic anemia  - Pt presents with mild abd pain, nausea, one episode of dark emesis on 9/27, followed by melena  - Hgb found to be 6.9 on admission, down from 13.5 one month earlier-- given 2 units PRBC  -overnight, trended down 1 gram-- no overt bleeding -CT scan abd/pelvis negative for  bleed - GI is consulting and much appreciated: PPI gtt and then PPI PO -h/h this PM -heme test all stools   Dens fracture - Pt suffered dens fracture 1 month ago and has been in cervical collar since  - Dr. Arnoldo Morale follow up appreciated: hard collar for approximately 12 weeks. At that point she can follow-up with me in the office for flexion-extension x-rays. I doubt she'll heal this fracture, but hopefully at that point it'll be stable enough to remove the collar.  Hypertension  - BP is at goal  - Continue nifedipine    Hypokalemia  - replete  Fall x 2 -? From anemia -PT eval - back to SNF   Depression, anxiety, insomnia  - Stable  - Continue Zoloft, Ativan, Ambien   ? UTI-ecoli -final culture urine pending -IV rocephin- change to PO ceftin    DVT prophylaxis:  SCD's  Code Status: Full Code   Family Communication: Called daughter, no answer  Disposition Plan:  SNF once h/h stable   Consultants:   GI  Neurosurgery     Subjective: No stools overnight No CP, no SOB  Objective: Vitals:   10/24/16 1733 10/24/16 2117 10/25/16 0530 10/25/16 0839  BP: (!) 124/52 (!) 129/52 (!) 123/53 (!) 134/52  Pulse: 98 81 75 79  Resp: 18 18 18 18   Temp: 98.2 F (36.8 C) 98.4 F (36.9 C) 98 F (36.7 C) 98.4 F (36.9 C)  TempSrc: Oral Oral Oral Oral  SpO2:  95% 96% 96% 95%  Weight:  61 kg (134 lb 7.7 oz)    Height:        Intake/Output Summary (Last 24 hours) at 10/25/16 1141 Last data filed at 10/25/16 0745  Gross per 24 hour  Intake          1353.76 ml  Output              800 ml  Net           553.76 ml   Filed Weights   10/22/16 2008 10/23/16 2043 10/24/16 2117  Weight: 68 kg (150 lb) 72.7 kg (160 lb 4.8 oz) 61 kg (134 lb 7.7 oz)    Examination:  General exam: NAD- with cervical collar on Respiratory system: clear Cardiovascular system: rrr Gastrointestinal system: +BS, soft Central nervous system: no deficits Extremities: min LE  edema     Data Reviewed: I have personally reviewed following labs and imaging studies  CBC:  Recent Labs Lab 10/22/16 2013 10/23/16 0252 10/23/16 0802 10/23/16 1631 10/24/16 0405 10/25/16 0228  WBC 11.8*  --   --   --  11.9* 9.7  HGB 6.9* 6.5* 7.8* 9.4* 9.4* 8.3*  HCT 21.8* 19.9* 24.1* 29.1* 29.1* 25.4*  MCV 86.2  --   --   --  84.8 87.0  PLT 377  --   --   --  341 269   Basic Metabolic Panel:  Recent Labs Lab 10/22/16 2013 10/23/16 0252 10/24/16 0405 10/25/16 0228  NA 136 140 139 138  K 3.3* 3.4* 3.5 3.5  CL 98* 109 107 105  CO2 26 24 25 25   GLUCOSE 124* 105* 103* 112*  BUN 18 16 10 8   CREATININE 0.88 0.81 0.76 0.83  CALCIUM 8.8* 7.6* 8.8* 8.7*   GFR: Estimated Creatinine Clearance: 37.8 mL/min (by C-G formula based on SCr of 0.83 mg/dL). Liver Function Tests:  Recent Labs Lab 10/22/16 2013  AST 24  ALT 18  ALKPHOS 64  BILITOT 0.4  PROT 6.1*  ALBUMIN 3.4*   No results for input(s): LIPASE, AMYLASE in the last 168 hours. No results for input(s): AMMONIA in the last 168 hours. Coagulation Profile:  Recent Labs Lab 10/22/16 2053  INR 1.04   Cardiac Enzymes: No results for input(s): CKTOTAL, CKMB, CKMBINDEX, TROPONINI in the last 168 hours. BNP (last 3 results) No results for input(s): PROBNP in the last 8760 hours. HbA1C: No results for input(s): HGBA1C in the last 72 hours. CBG: No results for input(s): GLUCAP in the last 168 hours. Lipid Profile: No results for input(s): CHOL, HDL, LDLCALC, TRIG, CHOLHDL, LDLDIRECT in the last 72 hours. Thyroid Function Tests: No results for input(s): TSH, T4TOTAL, FREET4, T3FREE, THYROIDAB in the last 72 hours. Anemia Panel: No results for input(s): VITAMINB12, FOLATE, FERRITIN, TIBC, IRON, RETICCTPCT in the last 72 hours. Urine analysis:    Component Value Date/Time   COLORURINE YELLOW 10/22/2016 2037   APPEARANCEUR CLEAR 10/22/2016 2037   LABSPEC 1.010 10/22/2016 2037   PHURINE 7.0 10/22/2016 2037    GLUCOSEU NEGATIVE 10/22/2016 2037   HGBUR NEGATIVE 10/22/2016 2037   BILIRUBINUR NEGATIVE 10/22/2016 2037   KETONESUR NEGATIVE 10/22/2016 2037   PROTEINUR NEGATIVE 10/22/2016 2037   UROBILINOGEN 0.2 08/23/2006 2210   NITRITE POSITIVE (A) 10/22/2016 2037   LEUKOCYTESUR MODERATE (A) 10/22/2016 2037     ) Recent Results (from the past 240 hour(s))  Culture, Urine     Status: Abnormal   Collection Time: 10/22/16  8:37 PM  Result Value Ref Range Status   Specimen Description URINE, CATHETERIZED  Final   Special Requests NONE  Final   Culture >=100,000 COLONIES/mL ESCHERICHIA COLI (A)  Final   Report Status 10/25/2016 FINAL  Final   Organism ID, Bacteria ESCHERICHIA COLI (A)  Final      Susceptibility   Escherichia coli - MIC*    AMPICILLIN 8 SENSITIVE Sensitive     CEFAZOLIN <=4 SENSITIVE Sensitive     CEFTRIAXONE <=1 SENSITIVE Sensitive     CIPROFLOXACIN <=0.25 SENSITIVE Sensitive     GENTAMICIN <=1 SENSITIVE Sensitive     IMIPENEM <=0.25 SENSITIVE Sensitive     NITROFURANTOIN <=16 SENSITIVE Sensitive     TRIMETH/SULFA <=20 SENSITIVE Sensitive     AMPICILLIN/SULBACTAM <=2 SENSITIVE Sensitive     PIP/TAZO <=4 SENSITIVE Sensitive     Extended ESBL NEGATIVE Sensitive     * >=100,000 COLONIES/mL ESCHERICHIA COLI  MRSA PCR Screening     Status: Abnormal   Collection Time: 10/23/16  4:37 PM  Result Value Ref Range Status   MRSA by PCR POSITIVE (A) NEGATIVE Final    Comment:        The GeneXpert MRSA Assay (FDA approved for NASAL specimens only), is one component of a comprehensive MRSA colonization surveillance program. It is not intended to diagnose MRSA infection nor to guide or monitor treatment for MRSA infections. RESULT CALLED TO, READ BACK BY AND VERIFIED WITH: C MARSHALL RN [id number] 10/23/16 A BROWNING       Anti-infectives    Start     Dose/Rate Route Frequency Ordered Stop   10/24/16 1330  cefTRIAXone (ROCEPHIN) 1 g in dextrose 5 % 50 mL IVPB     1 g 100 mL/hr  over 30 Minutes Intravenous Every 24 hours 10/24/16 1214         Radiology Studies: Ct Abdomen Pelvis Wo Contrast  Result Date: 10/25/2016 CLINICAL DATA:  Anemia.  GI bleed. EXAM: CT ABDOMEN AND PELVIS WITHOUT CONTRAST TECHNIQUE: Multidetector CT imaging of the abdomen and pelvis was performed following the standard protocol without IV contrast. COMPARISON:  None. FINDINGS: Lower chest: Small bilateral pleural effusions, right greater than left with overlying atelectasis. The heart is upper limits of normal in size for age. Three-vessel coronary artery calcifications, mitral valve annular calcifications and aortic calcifications. Hepatobiliary: No focal hepatic lesions or intrahepatic biliary dilatation. The gallbladder is distended and contains numerous layering small gallstones. No definite CT findings for acute cholecystitis. No common bile duct dilatation. Pancreas: No mass, inflammation or ductal dilatation. Spleen: Normal size.  No focal lesions. Adrenals/Urinary Tract: The adrenal glands are normal. There are multiple hyperdense/ hemorrhagic right renal cyst. No hydronephrosis. The bladder is moderately distended but no mass or calculi. Stomach/Bowel: The stomach, duodenum, small bowel and colon are grossly normal without oral contrast. No acute inflammatory process, mass lesions or obstructive findings. Scattered sigmoid diverticulosis without findings for acute diverticulitis. The terminal ileum is normal. The appendix is not identified. Pills are noted in the small bowel and near the ileocecal valve. Vascular/Lymphatic: Advanced atherosclerotic calcifications involving the aorta and branch vessels. No focal aneurysm. No mesenteric or retroperitoneal mass or adenopathy. Reproductive: Uterine fibroids are noted. The ovaries appear normal. Other: No pelvic mass or free pelvic fluid collections. Pelvic or inguinal adenopathy. No abdominal wall hernia or subcutaneous lesions. Musculoskeletal: Lumbar  fusion hardware noted. No acute bony findings. IMPRESSION: 1. No acute abdominal/pelvic findings, mass lesions or lymphadenopathy. 2. No obvious bowel abnormality given limitation of no  IV or oral contrast. 3. Hyperdense/hemorrhagic cyst associate with the right kidney. 4. Distended gallbladder with numerous gallstones. 5. Small bilateral pleural effusions with overlying atelectasis, right greater than left. 6. Advanced atherosclerotic calcifications involving the aorta and branch vessels. 7. Uterine fibroids. Electronically Signed   By: Marijo Sanes M.D.   On: 10/25/2016 10:36        Scheduled Meds: . cholecalciferol  2,000 Units Oral QHS  . latanoprost  1 drop Both Eyes QHS  . loratadine  10 mg Oral Daily  . NIFEdipine  60 mg Oral Daily  . omega-3 acid ethyl esters  1 g Oral Daily  . [START ON 10/26/2016] pantoprazole  40 mg Intravenous Q12H  . polyethylene glycol  17 g Oral Daily  . potassium chloride  10 mEq Oral Q breakfast  . predniSONE  2.5 mg Oral Q breakfast  . sertraline  50 mg Oral Q breakfast  . sodium chloride flush  3 mL Intravenous Q12H  . sodium chloride flush  3 mL Intravenous Q12H  . zolpidem  5 mg Oral QHS   Continuous Infusions: . sodium chloride    . cefTRIAXone (ROCEPHIN)  IV Stopped (10/24/16 1435)  . pantoprozole (PROTONIX) infusion 8 mg/hr (10/25/16 0124)     LOS: 3 days    Time spent: 25 min    [place], DO [facility] Pager [phone]  If 7PM-7AM, please contact night-coverage [email or web address] Password [email or web address] 10/25/2016, 11:41 AM

## 2016-10-25 NOTE — Progress Notes (Signed)
Patient's Hgb dropped again overnight, no dark stools or frank blood per nursing.  Will get CT abd/pelvis to r/o retro bleed as patient has had multiple falls.  Jamie Brown

## 2016-10-26 LAB — CBC
HCT: 28.9 % — ABNORMAL LOW (ref 36.0–46.0)
HEMOGLOBIN: 9.1 g/dL — AB (ref 12.0–15.0)
MCH: 27.2 pg (ref 26.0–34.0)
MCHC: 31.5 g/dL (ref 30.0–36.0)
MCV: 86.5 fL (ref 78.0–100.0)
Platelets: 351 10*3/uL (ref 150–400)
RBC: 3.34 MIL/uL — AB (ref 3.87–5.11)
RDW: 16.2 % — ABNORMAL HIGH (ref 11.5–15.5)
WBC: 10.4 10*3/uL (ref 4.0–10.5)

## 2016-10-26 LAB — OCCULT BLOOD X 1 CARD TO LAB, STOOL: Fecal Occult Bld: NEGATIVE

## 2016-10-26 MED ORDER — MUPIROCIN 2 % EX OINT: 1.0000 "application " | TOPICAL_OINTMENT | Freq: Two times a day (BID) | CUTANEOUS | 0 refills | Status: DC

## 2016-10-26 MED ORDER — POLYETHYLENE GLYCOL 3350 17 G PO PACK: 17.0000 g | PACK | Freq: Every day | ORAL | 0 refills | Status: DC | PRN

## 2016-10-26 MED ORDER — OXYCODONE HCL 5 MG PO TABS: 5.0000 mg | ORAL_TABLET | ORAL | 0 refills | Status: DC | PRN

## 2016-10-26 MED ORDER — PANTOPRAZOLE SODIUM 40 MG PO TBEC: 40.0000 mg | DELAYED_RELEASE_TABLET | Freq: Two times a day (BID) | ORAL | Status: DC

## 2016-10-26 MED ORDER — CEFUROXIME AXETIL 500 MG PO TABS: 500.0000 mg | ORAL_TABLET | Freq: Two times a day (BID) | ORAL | Status: DC

## 2016-10-26 MED ORDER — TIZANIDINE HCL 2 MG PO TABS: 2.0000 mg | ORAL_TABLET | Freq: Three times a day (TID) | ORAL | 0 refills | Status: DC | PRN

## 2016-10-26 MED ORDER — CHLORHEXIDINE GLUCONATE CLOTH 2 % EX PADS: 6.0000 | MEDICATED_PAD | Freq: Every day | CUTANEOUS | Status: DC

## 2016-10-26 NOTE — Clinical Social Work Placement (Signed)
   CLINICAL SOCIAL WORK PLACEMENT  NOTE  Date:  10/26/2016  Patient Details  Name: Jamie Brown MRN: [id number] Date of Birth: [birth year]  Clinical Social Work is seeking post-discharge placement for this patient at the [place] level of care (*CSW will initial, date and re-position this form in  chart as items are completed):  Yes   Patient/family provided with [place] Work Department's list of facilities offering this level of care within the geographic area requested by the patient (or if unable, by the patient's family).  Yes   Patient/family informed of their freedom to choose among providers that offer the needed level of care, that participate in Medicare, Medicaid or managed care program needed by the patient, have an available bed and are willing to accept the patient.  Yes   Patient/family informed of [facility]'s ownership interest in [facility] and [facility], as well as of the fact that they are under no obligation to receive care at these facilities.  PASRR submitted to EDS on 10/24/16     PASRR number received on       Existing PASRR number confirmed on 10/24/16     FL2 transmitted to all facilities in geographic area requested by pt/family on 10/24/16     FL2 transmitted to all facilities within larger geographic area on       Patient informed that his/her managed care company has contracts with or will negotiate with certain facilities, including the following:  [place] [facility] informed of bed offers received.  Patient chooses bed at [facility]     Physician recommends and patient chooses bed at      Patient to be transferred to [facility] [place] on 10/26/16.  Patient to be transferred to facility by family will drive     Patient family notified on 10/26/16 of transfer.  Name of family member notified:  Izora Gala, daughter     PHYSICIAN Please  prepare priority discharge summary, including medications     Additional Comment:    _______________________________________________ Estanislado Emms, LCSW 10/26/2016, 12:46 PM

## 2016-10-26 NOTE — Progress Notes (Signed)
No blood in stool.  CT yesterday showed no retroperitoneal bleeding.  Suggest PPI daily indefinitely.  OK to follow-up with me in 4 weeks as outpatient; suggest CBC in rehab in 1 week. Will sign-off; please call with questions; thank you for the consultation.

## 2016-10-26 NOTE — Progress Notes (Signed)
Attempted to call report x2, number left with receptionist at [facility]

## 2016-10-26 NOTE — Discharge Summary (Signed)
Physician Discharge Summary  Jamie Brown JKK:[id number] DOB: [birth year] DOA: 10/22/2016  PCP: Jamie Glazier, Jamie Brown  Admit date: 10/22/2016 Discharge date: 10/26/2016   Recommendations for Outpatient Follow-Up:   1. Soft diet for 1 week then regular 2. Cbc 1 week 3. Follow in 6-7 weeks with Jamie Brown re dens fracture 4. DNR 5. Wear neck brace until seen by Jamie Brown 6. 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  Diet recommendation: Low sodium, heart healthy  Wound care: None.   History of Present Illness:   Jamie Brown is a 83 y.o. 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 Patient reportedly fell approximately 1 month ago, was found to have suffered a dense fracture, has been kept in a cervical collar, and has follow-up with neurosurgery scheduled for 10/24/2016. She had been doing well until 10/19/2016, when she began to complain of feeling unwell, noting that she was having abdominal pain and nausea, and had an episode of emesis that was unwitnessed, but dark Grady vomit was noted on her cervical collar. The following day, she began to have loose black stools. Today, stools have been formed, but continue to be black. She is not anticoagulated. Advil is on her medicine list, but family does not believe she has been taking it. She does take an aspirin 81 mg and has been on a low-dose prednisone as well.  She does not take H2 blocker or PPI. Denies any history of GI bleeding. Denies chest pain, headache, or confusion.  ED Course: Upon arrival to the ED, patient is found to be afebrile, saturating well on room air, and with vitals otherwise stable. Orthostatic vital signs are negative. Chemistry panel reveals a potassium of 3.3 and CBC is notable for hemoglobin of 6.9, down from 13.5 one month ago. Fecal occult blood testing was positive. Patient was given 40 mEq of oral potassium, 40 mg IV Protonix, 1 unit of packed red blood cells, and gastroenterology was consulted by the ED physician. Patient remained hemodynamically stable and in no apparent respiratory distress in the ED, and will be admitted to the telemetry unit for ongoing evaluation and management of acute upper GI bleed.   Hospital Course by Problem:   upper GIB, symptomatic anemia  - Pt presents with mild abd pain, nausea, one episode of dark emesis on 9/27, followed by melena  - Hgb found to be 6.9 on admission, down from 13.5 one month earlier-- given 2 units PRBC  -overnight, trended down 1 gram-- no overt bleeding -CT scan abd/pelvis negative for bleed - GI is consulting and much appreciated: PPI gtt and then PPI PO -last stool was occult negative  Dens fracture - Pt suffered dens fracture 1 month ago and has been in cervical collar since  - Jamie Brown follow up appreciated: hard collar for approximately 12 weeks. At that point she can follow-up with me in the office for flexion-extension x-rays. I  doubt she'll heal this fracture, but hopefully at that point it'll be stable enough to remove the collar.  Hypertension  - BP is at goal  - Continue nifedipine    Hypokalemia  - replete  Fall x 2 -? From anemia -PT eval - back to SNF then ALF   Depression, anxiety, insomnia  - Stable  - Continue Zoloft, Ativan, Ambien   ? UTI-ecoli -final culture urine pending -IV rocephin- change to PO ceftin    Medical  Consultants:    GI   Discharge Exam:   Vitals:   10/26/16 0625 10/26/16 0753  BP: (!) 132/116 (!) 114/48  Pulse: 76   Resp: 16   Temp: 97.8 F (36.6 C)   SpO2: 97%    Vitals:   10/25/16 1803 10/25/16 2118 10/26/16 0625 10/26/16 0753  BP: (!) 142/42 (!) 146/40 (!) 132/116 (!) 114/48  Pulse: 78 86 76   Resp: 18 18 16    Temp: 98.2 F (36.8 C) 98 F (36.7 C) 97.8 F (36.6 C)   TempSrc: Oral Oral Oral   SpO2: 96% 97% 97%   Weight:      Height:        Gen:  NAD  The results of significant diagnostics from this hospitalization (including imaging, microbiology, ancillary and laboratory) are listed below for reference.     Procedures and Diagnostic Studies:   No results found.   Labs:   Basic Metabolic Panel:  Recent Labs Lab 10/22/16 2013 10/23/16 0252 10/24/16 0405 10/25/16 0228  NA 136 140 139 138  K 3.3* 3.4* 3.5 3.5  CL 98* 109 107 105  CO2 26 24 25 25   GLUCOSE 124* 105* 103* 112*  BUN 18 16 10 8   CREATININE 0.88 0.81 0.76 0.83  CALCIUM 8.8* 7.6* 8.8* 8.7*   GFR Estimated Creatinine Clearance: 37.8 mL/min (by C-G formula based on SCr of 0.83 mg/dL). Liver Function Tests:  Recent Labs Lab 10/22/16 2013  AST 24  ALT 18  ALKPHOS 64  BILITOT 0.4  PROT 6.1*  ALBUMIN 3.4*   No results for input(s): LIPASE, AMYLASE in the last 168 hours. No results for input(s): AMMONIA in the last 168 hours. Coagulation profile  Recent Labs Lab 10/22/16 2053  INR 1.04    CBC:  Recent Labs Lab 10/22/16 2013  10/23/16 1631 10/24/16 0405 10/25/16 0228 10/25/16 1452 10/26/16 0237  WBC 11.8*  --   --  11.9* 9.7  --  10.4  HGB 6.9*  < > 9.4* 9.4* 8.3* 9.6* 9.1*  HCT 21.8*  < > 29.1* 29.1* 25.4* 30.0* 28.9*  MCV 86.2  --   --  84.8 87.0  --  86.5  PLT 377  --   --  341 296  --  351  < > = values in this interval not displayed. Cardiac Enzymes: No results for input(s): CKTOTAL, CKMB, CKMBINDEX, TROPONINI in the last 168 hours. BNP: Invalid  input(s): POCBNP CBG: No results for input(s): GLUCAP in the last 168 hours. D-Dimer No results for input(s): DDIMER in the last 72 hours. Hgb A1c No results for input(s): HGBA1C in the last 72 hours. Lipid Profile No results for input(s): CHOL, HDL, LDLCALC, TRIG, CHOLHDL, LDLDIRECT in the last 72 hours. Thyroid function studies No results for input(s): TSH, T4TOTAL, T3FREE, THYROIDAB in the last 72 hours.  Invalid input(s): FREET3 Anemia work up No results for input(s): VITAMINB12, FOLATE, FERRITIN, TIBC, IRON, RETICCTPCT in the last 72 hours. Microbiology Recent Results (from the past  240 hour(s))  Culture, Urine     Status: Abnormal   Collection Time: 10/22/16  8:37 PM  Result Value Ref Range Status   Specimen Description URINE, CATHETERIZED  Final   Special Requests NONE  Final   Culture >=100,000 COLONIES/mL ESCHERICHIA COLI (A)  Final   Report Status 10/25/2016 FINAL  Final   Organism ID, Bacteria ESCHERICHIA COLI (A)  Final      Susceptibility   Escherichia coli - MIC*    AMPICILLIN 8 SENSITIVE Sensitive     CEFAZOLIN <=4 SENSITIVE Sensitive     CEFTRIAXONE <=1 SENSITIVE Sensitive     CIPROFLOXACIN <=0.25 SENSITIVE Sensitive     GENTAMICIN <=1 SENSITIVE Sensitive     IMIPENEM <=0.25 SENSITIVE Sensitive     NITROFURANTOIN <=16 SENSITIVE Sensitive     TRIMETH/SULFA <=20 SENSITIVE Sensitive     AMPICILLIN/SULBACTAM <=2 SENSITIVE Sensitive     PIP/TAZO <=4 SENSITIVE Sensitive     Extended ESBL NEGATIVE Sensitive     * >=100,000 COLONIES/mL ESCHERICHIA COLI  MRSA PCR Screening     Status: Abnormal   Collection Time: 10/23/16  4:37 PM  Result Value Ref Range Status   MRSA by PCR POSITIVE (A) NEGATIVE Final    Comment:        The GeneXpert MRSA Assay (FDA approved for NASAL specimens only), is one component of a comprehensive MRSA colonization surveillance program. It is not intended to diagnose MRSA infection nor to guide or monitor treatment for MRSA  infections. RESULT CALLED TO, READ BACK BY AND VERIFIED WITH: C MARSHALL RN [id number] 10/23/16 A BROWNING      Discharge Instructions:   Discharge Instructions    Discharge instructions    Complete by:  As directed    Soft diet for 1 week then regular Cbc 1 week   Increase activity slowly    Complete by:  As directed      Allergies as of 10/26/2016      Reactions   Codeine Itching   Hives   Gabapentin Other (See Comments)   Difficulty finding words and foggy feeling   Isoptin [verapamil] Itching   Remeron [mirtazapine] Other (See Comments)   Muscle cramping and pain   Trazodone And Nefazodone Other (See Comments)   Mental confusion   Aldara [imiquimod] Rash   Redness       Medication List    STOP taking these medications   aspirin EC 81 MG tablet     TAKE these medications   acetaminophen 325 MG tablet Commonly known as:  TYLENOL Take 650 mg by mouth every 6 (six) hours as needed for mild pain.   acetaminophen 500 MG tablet Commonly known as:  TYLENOL Take 1,000 mg by mouth 2 (two) times daily.   bisacodyl 10 MG suppository Commonly known as:  DULCOLAX Place 10 mg rectally every 3 (three) days.   CALCIUM 600-D 600-400 MG-UNIT Tabs Generic drug:  Calcium Carbonate-Vitamin D3 Take 1 tablet by mouth daily.   cefUROXime 500 MG tablet Commonly known as:  CEFTIN Take 1 tablet (500 mg total) by mouth 2 (two) times daily with a meal.   cetirizine 10 MG tablet Commonly known as:  ZYRTEC Take 10 mg by mouth daily.   Chlorhexidine Gluconate Cloth 2 % Pads Apply 6 each topically daily at 6 (six) AM.   mupirocin ointment 2 % Commonly known as:  BACTROBAN Place 1 application into the nose 2 (two) times daily.   NIFEdipine 60 MG 24 hr tablet Commonly  known as:  PROCARDIA XL/ADALAT-CC Take 60 mg by mouth daily.   nystatin powder Commonly known as:  MYCOSTATIN/NYSTOP Apply 1 g topically 3 (three) times daily.   ondansetron 4 MG tablet Commonly known as:   ZOFRAN Take 4 mg by mouth every 8 (eight) hours as needed for nausea or vomiting.   oxyCODONE 5 MG immediate release tablet Commonly known as:  Oxy IR/ROXICODONE Take 1 tablet (5 mg total) by mouth every 4 (four) hours as needed for severe pain.   pantoprazole 40 MG tablet Commonly known as:  PROTONIX Take 1 tablet (40 mg total) by mouth 2 (two) times daily before a meal.   polyethylene glycol packet Commonly known as:  MIRALAX / GLYCOLAX Take 17 g by mouth daily as needed.   potassium chloride 10 MEQ tablet Commonly known as:  K-DUR,KLOR-CON Take 10 mEq by mouth daily with breakfast.   predniSONE 2.5 MG tablet Commonly known as:  DELTASONE Take 2.5 mg by mouth daily with breakfast.   sertraline 50 MG tablet Commonly known as:  ZOLOFT Take 50 mg by mouth daily with breakfast.   STOOL SOFTENER 100 MG capsule Generic drug:  docusate sodium Take 100 mg by mouth daily as needed for mild constipation.   tiZANidine 2 MG tablet Commonly known as:  ZANAFLEX Take 1 tablet (2 mg total) by mouth every 8 (eight) hours as needed for muscle spasms.   torsemide 10 MG tablet Commonly known as:  DEMADEX Take 10-30 mg by mouth See admin instructions. 10 mg four times a week, 30 mg three times a week   ZIOPTAN 0.0015 % Soln Generic drug:  Tafluprost Place 1 drop into both eyes at bedtime.   zolpidem 10 MG tablet Commonly known as:  AMBIEN Take 10 mg by mouth at bedtime.      Follow-up Information    Jamie Glazier, Jamie Brown Follow up in 1 week(s).   Specialty:  Internal Medicine Why:  Jamie Bang, Jamie Brown Follow up in 7 week(s).   Specialty:  Neurosurgery Contact information: [address] [place] [address] [phone]            Time coordinating discharge: 35 min  Signed:  JESSICA Alison Stalling   [facility] 10/26/2016, 11:01 AM

## 2016-10-26 NOTE — Progress Notes (Signed)
Patient discharged to SNF, AVS reviewed with family and patient at bedside. IV removed.

## 2016-10-26 NOTE — Progress Notes (Signed)
Patient will discharge back to [facility]. Anticipated discharge date: 10/26/16 Family notified: Izora Gala, daughter Transportation by: Izora Gala, daughter   Nurse to call report to [phone].   CSW signing off.  Estanislado Emms, [place]  Clinical Social Worker

## 2016-10-27 ENCOUNTER — Ambulatory Visit: Payer: Medicare Other

## 2016-10-27 ENCOUNTER — Inpatient Hospital Stay: Admission: RE | Admit: 2016-10-27 | Payer: Self-pay | Source: Ambulatory Visit | Admitting: Radiation Oncology

## 2017-03-10 ENCOUNTER — Other Ambulatory Visit
Admission: RE | Admit: 2017-03-10 | Discharge: 2017-03-10 | Disposition: A | Discharge status: Home / Self Care | Payer: Medicare Other | Source: Intra-hospital | Attending: Internal Medicine | Admitting: Internal Medicine

## 2017-03-10 LAB — URINALYSIS, COMPLETE (UACMP) WITH MICROSCOPIC
BILIRUBIN URINE: NEGATIVE
Bacteria, UA: NONE SEEN
Glucose, UA: NEGATIVE mg/dL
HGB URINE DIPSTICK: NEGATIVE
Ketones, ur: NEGATIVE mg/dL
Nitrite: NEGATIVE
PROTEIN: NEGATIVE mg/dL
Specific Gravity, Urine: 1.011 (ref 1.005–1.030)
pH: 6 (ref 5.0–8.0)

## 2018-12-04 NOTE — Progress Notes (Deleted)
[place] Consult Note Telephone: [phone]  Fax: [phone]  PATIENT NAME: Jamie Brown DOB: [birth year] MRN: [id number]  PRIMARY CARE PROVIDER:   Javier Glazier, MD  REFERRING PROVIDER:  Javier Glazier, MD [place],  [place] [address]  RESPONSIBLE PARTY:     ASSESSMENT:        RECOMMENDATIONS and PLAN:  1.  I spent *** minutes providing this consultation,  from *** to ***. More than 50% of the time in this consultation was spent coordinating communication.   HISTORY OF PRESENT ILLNESS:  Jamie Brown is a 85 y.o. year old female with multiple medical problems including ***. Palliative Care was asked to help address goals of care.   CODE STATUS:   PPS: 0% HOSPICE ELIGIBILITY/DIAGNOSIS: TBD  PAST MEDICAL HISTORY:  Past Medical History:  Diagnosis Date  . Arthritis    OA  . Coronary artery disease   . GI bleeding 10/23/2016  . High cholesterol   . HTN (hypertension) 05/28/2015  . Hypertension   . Radiation 11/19/14-12/30/14   upper right neck 5000 cGy  . Squamous cell carcinoma     SOCIAL HX:  Social History   Tobacco Use  . Smoking status: Never Smoker  . Smokeless tobacco: Never Used  Substance Use Topics  . Alcohol use: No    ALLERGIES:  Allergies  Allergen Reactions  . Codeine Itching    Hives  . Gabapentin Other (See Comments)    Difficulty finding words and foggy feeling  . Isoptin [Verapamil] Itching  . Remeron [Mirtazapine] Other (See Comments)    Muscle cramping and pain  . Trazodone And Nefazodone Other (See Comments)    Mental confusion  . Aldara [Imiquimod] Rash    Redness      PERTINENT MEDICATIONS:  Outpatient Encounter Medications as of 12/05/2018  Medication Sig  . acetaminophen (TYLENOL) 325 MG tablet Take 650 mg by mouth every 6 (six) hours as needed for mild pain.  Marland Kitchen acetaminophen (TYLENOL) 500 MG tablet Take 1,000 mg by mouth 2 (two) times daily.  . bisacodyl  (DULCOLAX) 10 MG suppository Place 10 mg rectally every 3 (three) days.  . Calcium Carbonate-Vitamin D3 (CALCIUM 600-D) 600-400 MG-UNIT TABS Take 1 tablet by mouth daily.  . cefUROXime (CEFTIN) 500 MG tablet Take 1 tablet (500 mg total) by mouth 2 (two) times daily with a meal.  . cetirizine (ZYRTEC) 10 MG tablet Take 10 mg by mouth daily.  . Chlorhexidine Gluconate Cloth 2 % PADS Apply 6 each topically daily at 6 (six) AM.  . docusate sodium (STOOL SOFTENER) 100 MG capsule Take 100 mg by mouth daily as needed for mild constipation.  . mupirocin ointment (BACTROBAN) 2 % Place 1 application into the nose 2 (two) times daily.  Marland Kitchen NIFEdipine (PROCARDIA XL/ADALAT-CC) 60 MG 24 hr tablet Take 60 mg by mouth daily.  Marland Kitchen nystatin (MYCOSTATIN/NYSTOP) powder Apply 1 g topically 3 (three) times daily.  . ondansetron (ZOFRAN) 4 MG tablet Take 4 mg by mouth every 8 (eight) hours as needed for nausea or vomiting.  Marland Kitchen oxyCODONE (OXY IR/ROXICODONE) 5 MG immediate release tablet Take 1 tablet (5 mg total) by mouth every 4 (four) hours as needed for severe pain.  . pantoprazole (PROTONIX) 40 MG tablet Take 1 tablet (40 mg total) by mouth 2 (two) times daily before a meal.  . polyethylene glycol (MIRALAX / GLYCOLAX) packet Take 17 g by mouth daily as needed.  Marland Kitchen  potassium chloride (K-DUR,KLOR-CON) 10 MEQ tablet Take 10 mEq by mouth daily with breakfast.  . predniSONE (DELTASONE) 2.5 MG tablet Take 2.5 mg by mouth daily with breakfast.  . sertraline (ZOLOFT) 50 MG tablet Take 50 mg by mouth daily with breakfast.  . Tafluprost (ZIOPTAN) 0.0015 % SOLN Place 1 drop into both eyes at bedtime.  Marland Kitchen tiZANidine (ZANAFLEX) 2 MG tablet Take 1 tablet (2 mg total) by mouth every 8 (eight) hours as needed for muscle spasms.  Marland Kitchen torsemide (DEMADEX) 10 MG tablet Take 10-30 mg by mouth See admin instructions. 10 mg four times a week, 30 mg three times a week  . zolpidem (AMBIEN) 10 MG tablet Take 10 mg by mouth at bedtime.   No  facility-administered encounter medications on file as of 12/05/2018.     PHYSICAL EXAM:   General: NAD, frail appearing, thin Cardiovascular: regular rate and rhythm Pulmonary: clear ant fields Abdomen: soft, nontender, + bowel sounds GU: no suprapubic tenderness Extremities: no edema, no joint deformities Skin: no rashes Neurological: Weakness but otherwise nonfocal  Julianne Handler, NP

## 2018-12-05 ENCOUNTER — Encounter: Payer: Medicare Other | Admitting: Internal Medicine

## 2018-12-05 ENCOUNTER — Other Ambulatory Visit: Payer: Self-pay

## 2018-12-10 ENCOUNTER — Non-Acute Institutional Stay: Payer: Medicare Other | Admitting: Internal Medicine

## 2018-12-10 ENCOUNTER — Other Ambulatory Visit: Payer: Self-pay

## 2018-12-10 ENCOUNTER — Encounter: Payer: Self-pay | Admitting: Internal Medicine

## 2018-12-10 DIAGNOSIS — I509 Heart failure, unspecified: Secondary | ICD-10-CM

## 2018-12-10 DIAGNOSIS — Z515 Encounter for palliative care: Secondary | ICD-10-CM

## 2018-12-10 NOTE — Progress Notes (Addendum)
Nov 17th, 2020 [facility] Palliative Care Consult Note Telephone: [phone]  Fax: [phone]  PATIENT NAME: Jamie Brown DOB: [birth year] MRN: [id number] [facility] [id number] (move in date 11/07/2016)  PRIMARY CARE PROVIDER:    Javier Glazier, MD [address] [place] [address]  Dr. Paulita Fujita [facility] GI)  REFERRING PROVIDER:  Delle Reining, NP  RESPONSIBLE PARTY: (dtr/HCPOA) Massie Kluver ([phone] (M) [phone] or [phone] [email or web address]. Address: [address] [place]  (granddaughter) Mahala Menghini [phone]  ASSESSMENT / RECOMMENDATIONS:  1. Advance Care Planning:  A. Directives: (05/06/2003) HCPOA dtr Nancy)/Living Will. Present in [facility] EMR. DNR current in chart. Will upload to [facility]. MOST form discussed in brief with Izora Gala. At first blush, decision for DNR/DNI. Determine use of IVFs and antibiotics at time of need. No tube feeding. I will mail form to Halchita for her review and signature. She will mail back to me and I will upload into [facility] EMR and place original in patient's facility chart.  B. Goals of Care: Greatly enjoys reading, and conversations with her family. 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 Patient states she would not wish to pursue any diagnostic w/u or treatment of this mass. We also discussed her fluid accumulation, and would she be interested in a cardiology w/u (such as an echocardiogram) to further evaluate. She defers. She is okay with symptomatic treatment only.  2. Symptom Management: A .Fluid volume accumulation: Managed by PCP with augmented diuretics; some improvement in symptoms of dyspnea.  B. Fatigue/dyspnea setting; deconditioning possible pulmonary congestion: Sedentary d/t COVID restrictions and quarantine (confined to room) after community doctor visits.  3.Cognitive /  Functional status: Patient is A & O; sometimes doesn't recognize staff by name. HOH. Has hearing aids but dislikes wearing. Staff report better spirits as she went outside facility last week (dentist) and yesterday (ophthalmologist) for appointments and was able to see her daughter. Independent in transfers, toileting, feeding, and dressing. She ambulates in room with rolling walker. Assist with showers. Slipped to floor recently while showering, no injuries. Staff report patient more easily fatigued and dyspneic with these activities, gradually progressive over the last 3-4 months. Recent evaluation for continuous oxygen but patient did not meet criteria. Noticeable fluid volume accumulation over these last few months, with current weight 166 lbs, a gain of 10 lbs over 3 months. Treated by PCP with increase of diuretic (changed from Lasix to torsemide 15mg  qd), and bilateral LE compression wraps. Patient reports some improvement in dyspnea over these last few weeks. Recent CBC, TSH, BMET (11/13) unremarkable, but elevated glucose of 132. Patient is followed by Roxan Hockey (NP psych) for h/o MDD treated with Zoloft (stable). No recent Chronic insomnia managed with Ambien and Melatonin. 4. Family Supports: Previously at [facility]; likes [facility] better.  Two sons in [place] ([place] and [place]). Eddie called while I was here; he calls qd. 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 Follow up Palliative Care Visit: within the next month or so. TC to daughter Izora Gala with updated. I left her my contact information. I spent 60 minutes providing this consultation from 10am-11am. More than 50% of the time in this consultation was spent coordinating communication.   HISTORY OF PRESENT ILLNESS:  Jamie Brown is a  85 y.o. 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  Acute post hemorrhagic anemia. Hospital admission 9/30-10/04/2016 for UGIB (Hgb 6.9). Tx 2 u's PRBCs. CT negative for retroperitoneal bleeding. Plan chronic PPI.  Palliative Care was asked to help address goals of care.   CODE STATUS: DNR  PPS: 40%  HOSPICE ELIGIBILITY/DIAGNOSIS: TBD  PAST MEDICAL HISTORY:  Past Medical History:  Diagnosis Date  . Arthritis    OA  . Coronary artery disease   . GI bleeding 10/23/2016  . High cholesterol   . HTN (hypertension) 05/28/2015  . Hypertension   . Radiation 11/19/14-12/30/14   upper right neck 5000 cGy  . Squamous cell carcinoma     SOCIAL HX:  Social History   Tobacco Use  . Smoking status: Never Smoker  . Smokeless tobacco: Never Used  Substance Use Topics  . Alcohol use: No    ALLERGIES:  Allergies  Allergen Reactions  . Codeine Itching    Hives  . Gabapentin Other (See Comments)    Difficulty finding words and foggy feeling  . Isoptin [Verapamil] Itching  . Remeron [Mirtazapine] Other (See Comments)    Muscle cramping and pain  . Trazodone And Nefazodone Other (See Comments)    Mental confusion  . Aldara [Imiquimod] Rash    Redness      PERTINENT MEDICATIONS:  Outpatient Encounter Medications as of 12/10/2018  Medication Sig  . acetaminophen (TYLENOL) 325 MG tablet Take 650 mg by mouth every 6 (six) hours as needed for mild pain.  Marland Kitchen albuterol (VENTOLIN HFA) 108 (90 Base) MCG/ACT inhaler Inhale 1 puff into the lungs 2 (two) times daily.  . Carboxymethylcellulose Sodium (ARTIFICIAL TEARS OP) Apply 1 drop to eye 3 (three) times daily.  . cetirizine (ZYRTEC) 10 MG tablet Take 10 mg by mouth daily.  . Cholecalciferol (VITAMIN D3) 30 MCG/15ML LIQD Take by mouth. Vitamin D 3 2000 IU cap ; 2 caps qd  . Cranberry 425 MG CAPS Take by mouth daily.  . Cyanocobalamin (VITAMIN B 12 PO) Take 1,000 mcg by mouth daily.  . iron polysaccharides (NIFEREX) 150 MG capsule Take 150 mg by  mouth 2 (two) times daily.  Marland Kitchen lactobacillus acidophilus (BACID) TABS tablet Take 2 tablets by mouth daily.  Marland Kitchen latanoprost (XALATAN) 0.005 % ophthalmic solution Place 1 drop into both eyes at bedtime.  . Melatonin 10 MG TABS Take 10 mg by mouth at bedtime.  . mineral oil light external liquid Apply 2 application topically once a week. Two gtts each ear weekly  . NIFEdipine (PROCARDIA XL/ADALAT-CC) 60 MG 24 hr tablet Take 60 mg by mouth daily.  Marland Kitchen nystatin (MYCOSTATIN/NYSTOP) powder Apply 1 g topically 3 (three) times daily.  . Omega-3 Fatty Acids (FISH OIL) 1200 MG CAPS Take 1 capsule by mouth daily.  . pantoprazole (PROTONIX) 40 MG tablet Take 1 tablet (40 mg total) by mouth 2 (two) times daily before a meal.  . polyethylene glycol (MIRALAX / GLYCOLAX) packet Take 17 g by mouth daily as needed. (Patient taking differently: Take 17 g by mouth daily. )  . potassium chloride (K-DUR,KLOR-CON) 10 MEQ tablet Take 10 mEq by mouth daily with breakfast.  . sertraline (ZOLOFT) 50 MG tablet Take 50 mg by mouth daily with breakfast.  . tiZANidine (ZANAFLEX) 2 MG tablet Take 1 tablet (2 mg total) by mouth every 8 (eight) hours as needed for muscle  spasms.  Marland Kitchen torsemide (DEMADEX) 10 MG tablet Take 15 mg by mouth See admin instructions. 1.5 tabs (15mg ) qd  . zolpidem (AMBIEN) 10 MG tablet Take 10 mg by mouth at bedtime.  . bisacodyl (DULCOLAX) 10 MG suppository Place 10 mg rectally every 3 (three) days.  . Calcium Carbonate-Vitamin D3 (CALCIUM 600-D) 600-400 MG-UNIT TABS Take 1 tablet by mouth daily.  . cefUROXime (CEFTIN) 500 MG tablet Take 1 tablet (500 mg total) by mouth 2 (two) times daily with a meal.  . Chlorhexidine Gluconate Cloth 2 % PADS Apply 6 each topically daily at 6 (six) AM.  . docusate sodium (STOOL SOFTENER) 100 MG capsule Take 100 mg by mouth daily as needed for mild constipation.  . mupirocin ointment (BACTROBAN) 2 % Place 1 application into the nose 2 (two) times daily.  . [DISCONTINUED]  acetaminophen (TYLENOL) 500 MG tablet Take 1,000 mg by mouth 2 (two) times daily.  . [DISCONTINUED] ondansetron (ZOFRAN) 4 MG tablet Take 4 mg by mouth every 8 (eight) hours as needed for nausea or vomiting.  . [DISCONTINUED] oxyCODONE (OXY IR/ROXICODONE) 5 MG immediate release tablet Take 1 tablet (5 mg total) by mouth every 4 (four) hours as needed for severe pain.  . [DISCONTINUED] predniSONE (DELTASONE) 2.5 MG tablet Take 2.5 mg by mouth daily with breakfast.  . [DISCONTINUED] Tafluprost (ZIOPTAN) 0.0015 % SOLN Place 1 drop into both eyes at bedtime.   No facility-administered encounter medications on file as of 12/10/2018.     PHYSICAL EXAM:   BP 130/54, HR 76, RR 26  General: NAD, well nourished. A & O. Pleasantly conversant. Good historian.   Cardiovascular: regular rate and rhythm. Soft systolic murmur. She has a soft tissue mass about 1.5inches by 1 inch right anterior lower neck. Non tender to palpation.  Pulmonary: Bibasilar inspiratory crackles that clear a little with cough Abdomen: soft, nontender, + bowel sounds GU: no suprapubic tenderness Extremities: Lower extremity compression wraps in place. No joint deformities Skin: exposed skin without rashes Neurological: non-focall  Julianne Handler, NP

## 2019-01-15 ENCOUNTER — Non-Acute Institutional Stay: Payer: Medicare Other | Admitting: Internal Medicine

## 2019-01-15 DIAGNOSIS — Z515 Encounter for palliative care: Secondary | ICD-10-CM

## 2019-01-16 ENCOUNTER — Other Ambulatory Visit: Payer: Self-pay

## 2019-01-20 ENCOUNTER — Non-Acute Institutional Stay: Payer: Medicare Other | Admitting: Internal Medicine

## 2019-01-20 ENCOUNTER — Encounter: Payer: Self-pay | Admitting: Internal Medicine

## 2019-01-20 DIAGNOSIS — Z7189 Other specified counseling: Secondary | ICD-10-CM

## 2019-01-20 DIAGNOSIS — Z515 Encounter for palliative care: Secondary | ICD-10-CM

## 2019-01-20 NOTE — Progress Notes (Signed)
Dec 28th, 2020 [facility] Palliative Care Consult Note Telephone: [phone]  Fax: [phone]   PATIENT NAME: Jamie Brown DOB: November 23, 1924 MRN: [id number] [facility] [id number] (move in date 11/07/2016)   PRIMARY CARE PROVIDER:    Javier Glazier, MD [address] [place] [address]   Dr. Paulita Fujita [facility] GI)   REFERRING PROVIDER:  Delle Reining, NP   RESPONSIBLE PARTY: (dtr/HCPOA) Massie Kluver ([phone] (M) [phone] or [phone] [email or web address]. Address: [address] [place]  (granddaughter) Mahala Menghini [phone]   ASSESSMENT / RECOMMENDATIONS:  1. Advance Care Planning:  A. Directives: Received signed MOST form, from daughter/HCPOA Izora Gala. DNR/DNI. Limited use of Additional Interventions. Determine use of IVFs and antibiotics (antbx for rx of UTI or as comfort measure) at time of need. No tube feeding.   B. Goals of Care: Continues happy at [facility]. Has weathered facility COVID infection well. She continues enjoys reading, and daily conversations with her family. The soft t tissue mass in her R neck appears unchanged (context of h/o squamous cell carcinoma of the upper R neck; past radiation; in remission). There is no sign of impingement of this mass on her airway. Patient has previously stated that she would not wish to pursue any diagnostic w/u or treatment of this mass. She denies dyspnea; has occasional mild arthritic type pain L shoulder..  2. Family Supports: Patient states she likes [facility].  Two sons in [place] ([place] and [place]). 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 Follow up Palliative Care Visit: within the next 2 months or so. TC to daughter Izora Gala with updates. She has my contact information and can call me on a prn basis.   I spent 30 minutes providing this consultation from 3:30pm - 4pm. More than 50% of the time in  this consultation was spent coordinating communication.    HISTORY OF PRESENT ILLNESS:  Jamie Brown is a 85 y.o. 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  Acute post hemorrhagic anemia. Hospital admission 9/30-10/04/2016 for UGIB (Hgb 6.9). Tx 2 u's PRBCs. CT negative for retroperitoneal bleeding. Plan chronic PPI.   This is a f/u Palliative Care visit form 12/10/2018, at daughter's request.    CODE STATUS: DNR   PPS: 40%   HOSPICE ELIGIBILITY/DIAGNOSIS: TBD  PAST MEDICAL HISTORY:  Past Medical History:  Diagnosis Date  . Arthritis    OA  . Coronary artery disease   . GI bleeding 10/23/2016  . High cholesterol   . HTN (hypertension) 05/28/2015  . Hypertension   . Radiation 11/19/14-12/30/14   upper right neck 5000 cGy  . Squamous cell carcinoma     SOCIAL HX:  Social History   Tobacco Use  . Smoking status: Never Smoker  . Smokeless tobacco: Never Used  Substance Use Topics  . Alcohol use: No    ALLERGIES:  Allergies  Allergen Reactions  . Codeine Itching    Hives  . Gabapentin Other (See Comments)    Difficulty finding words and foggy feeling  . Isoptin [Verapamil] Itching  . Remeron [Mirtazapine] Other (See Comments)    Muscle cramping and pain  . Trazodone And Nefazodone Other (See Comments)    Mental confusion  . Aldara [Imiquimod] Rash    Redness  PERTINENT MEDICATIONS:  Outpatient Encounter Medications as of 01/20/2019  Medication Sig  . acetaminophen (TYLENOL) 325 MG tablet Take 650 mg by mouth every 6 (six) hours as needed for mild pain.  Marland Kitchen albuterol (VENTOLIN HFA) 108 (90 Base) MCG/ACT inhaler Inhale 1 puff into the lungs 2 (two) times daily.  . bisacodyl (DULCOLAX) 10 MG suppository Place 10 mg rectally every 3 (three) days.  . Calcium Carbonate-Vitamin D3 (CALCIUM 600-D) 600-400 MG-UNIT TABS Take 1 tablet by mouth daily.    . Carboxymethylcellulose Sodium (ARTIFICIAL TEARS OP) Apply 1 drop to eye 3 (three) times daily.  . cefUROXime (CEFTIN) 500 MG tablet Take 1 tablet (500 mg total) by mouth 2 (two) times daily with a meal.  . cetirizine (ZYRTEC) 10 MG tablet Take 10 mg by mouth daily.  . Chlorhexidine Gluconate Cloth 2 % PADS Apply 6 each topically daily at 6 (six) AM.  . Cholecalciferol (VITAMIN D3) 30 MCG/15ML LIQD Take by mouth. Vitamin D 3 2000 IU cap ; 2 caps qd  . Cranberry 425 MG CAPS Take by mouth daily.  . Cyanocobalamin (VITAMIN B 12 PO) Take 1,000 mcg by mouth daily.  Marland Kitchen docusate sodium (STOOL SOFTENER) 100 MG capsule Take 100 mg by mouth daily as needed for mild constipation.  . iron polysaccharides (NIFEREX) 150 MG capsule Take 150 mg by mouth 2 (two) times daily.  Marland Kitchen lactobacillus acidophilus (BACID) TABS tablet Take 2 tablets by mouth daily.  Marland Kitchen latanoprost (XALATAN) 0.005 % ophthalmic solution Place 1 drop into both eyes at bedtime.  . Melatonin 10 MG TABS Take 10 mg by mouth at bedtime.  . mineral oil light external liquid Apply 2 application topically once a week. Two gtts each ear weekly  . mupirocin ointment (BACTROBAN) 2 % Place 1 application into the nose 2 (two) times daily.  Marland Kitchen NIFEdipine (PROCARDIA XL/ADALAT-CC) 60 MG 24 hr tablet Take 60 mg by mouth daily.  Marland Kitchen nystatin (MYCOSTATIN/NYSTOP) powder Apply 1 g topically 3 (three) times daily.  . Omega-3 Fatty Acids (FISH OIL) 1200 MG CAPS Take 1 capsule by mouth daily.  . pantoprazole (PROTONIX) 40 MG tablet Take 1 tablet (40 mg total) by mouth 2 (two) times daily before a meal.  . polyethylene glycol (MIRALAX / GLYCOLAX) packet Take 17 g by mouth daily as needed. (Patient taking differently: Take 17 g by mouth daily. )  . potassium chloride (K-DUR,KLOR-CON) 10 MEQ tablet Take 10 mEq by mouth daily with breakfast.  . sertraline (ZOLOFT) 50 MG tablet Take 50 mg by mouth daily with breakfast.  . tiZANidine (ZANAFLEX) 2 MG tablet Take 1 tablet (2  mg total) by mouth every 8 (eight) hours as needed for muscle spasms.  Marland Kitchen torsemide (DEMADEX) 10 MG tablet Take 15 mg by mouth See admin instructions. 1.5 tabs (15mg ) qd  . zolpidem (AMBIEN) 10 MG tablet Take 10 mg by mouth at bedtime.   No facility-administered encounter medications on file as of 01/20/2019.    PHYSICAL EXAM:    O2 SATS 92% RA, HR 70, RR 16 General: NAD, well nourished. A & O. Pleasantly conversant. HOH. Cardiovascular: regular rate and rhythm. Soft systolic murmur. She has a soft tissue mass about 1.5inches by 1 inch right anterior lower neck. Non tender to palpation; stable Pulmonary: Clear anterior/posterior lung fields Abdomen: soft, nontender, + bowel sounds GU: no suprapubic tenderness Extremities: Lower extremity compression wraps in place. No joint deformities Skin: exposed skin without rashes Neurological: non-focal  Julianne Handler, NP

## 2019-01-21 ENCOUNTER — Other Ambulatory Visit: Payer: Self-pay

## 2019-01-22 NOTE — Progress Notes (Signed)
Appointment scheduled in error.  Patient not seen; no charge. Janijah Symons NP-C 

## 2019-02-04 ENCOUNTER — Encounter: Payer: Self-pay | Admitting: Internal Medicine

## 2019-02-04 ENCOUNTER — Non-Acute Institutional Stay: Payer: Medicare Other | Admitting: Internal Medicine

## 2019-02-04 DIAGNOSIS — Z7189 Other specified counseling: Secondary | ICD-10-CM

## 2019-02-04 DIAGNOSIS — Z515 Encounter for palliative care: Secondary | ICD-10-CM

## 2019-02-04 NOTE — Progress Notes (Signed)
Jan 12th, 2021 [facility] Palliative Care Consult Note Telephone: [phone]  Fax: [phone]   PATIENT NAME: Jamie Brown DOB: Dec 05, 1924 MRN: [id number] [facility] [id number] (move in date 11/07/2016)   PRIMARY CARE PROVIDER:    Javier Glazier, MD [address] [place] [address]   Dr. Paulita Brown [facility] GI)   REFERRING PROVIDER:  Delle Reining, NP   RESPONSIBLE PARTY: (dtr/HCPOA) Jamie Brown ([phone] (M) [phone] or [phone] [email or web address]. Address: [address] [place]  (granddaughter) Jamie Brown [phone]   ASSESSMENT / RECOMMENDATIONS:  1. Advance Care Planning:  A. Directives: MOST: DNR/DNI. Limited use of Additional Interventions. Determine use of IVFs and antibiotics (antbx for rx of UTI or as comfort measure) at time of need. No tube feeding.    B. Goals of Care: Continues happy at [facility], despite COVID restrictions. 2. Fall: Patient fell eve of Jan 1st beside the bed, setting of having had an Ambien tab 1 hour earlier. She sustained a raised bump on the back of her head. Her speech was a little delayed from baseline. Daughter did not wish patient to be transferred to ER for evaluation. The next morning patient was at her cognitive baseline. Schedule dosing of Ambien changed to 9pm (from 8pm). She's had no further falls.    3. Family Supports: Daughter Jamie Brown in close contact. Patient has two sons in [place] ([place] and [place]). 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   4. Follow up Palliative Care Visit: within the next 2 months or so. TC to daughter Jamie Brown with updates. She has my contact information and can call me on a prn basis.    I spent 30 minutes providing this consultation from 3:30pm - 4pm. More than 50% of the time in this consultation was spent coordinating communication.    HISTORY OF PRESENT ILLNESS:  Jamie Brown is a 86 y.o. 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  Acute post hemorrhagic anemia. Hospital admission 9/30-10/04/2016 for UGIB (Hgb 6.9). Tx 2 u's PRBCs. CT negative for retroperitoneal bleeding. Plan chronic PPI.   This is a f/u Palliative Care visit form 01/20/2019, at daughter's request.    CODE STATUS: DNR   PPS: 40%   HOSPICE ELIGIBILITY/DIAGNOSIS: TBD  PAST MEDICAL HISTORY:  Past Medical History:  Diagnosis Date  . Arthritis    OA  . Coronary artery disease   . GI bleeding 10/23/2016  . High cholesterol   . HTN (hypertension) 05/28/2015  . Hypertension   . Radiation 11/19/14-12/30/14   upper right neck 5000 cGy  . Squamous cell carcinoma     SOCIAL HX:  Social History   Tobacco Use  . Smoking status: Never Smoker  . Smokeless tobacco: Never Used  Substance Use Topics  . Alcohol use: No    ALLERGIES:  Allergies  Allergen Reactions  . Codeine Itching    Hives  . Gabapentin Other (See Comments)    Difficulty finding words and foggy feeling  . Isoptin [Verapamil] Itching  . Remeron [Mirtazapine] Other (See Comments)    Muscle cramping and pain  . Trazodone And Nefazodone Other (See Comments)    Mental confusion  . Aldara [Imiquimod] Rash    Redness      PERTINENT MEDICATIONS:  Outpatient  Encounter Medications as of 02/04/2019  Medication Sig  . acetaminophen (TYLENOL) 325 MG tablet Take 650 mg by mouth every 6 (six) hours as needed for mild pain.  Marland Kitchen albuterol (VENTOLIN HFA) 108 (90 Base) MCG/ACT inhaler Inhale 1 puff into the lungs 2 (two) times daily.  . bisacodyl (DULCOLAX) 10 MG suppository Place 10 mg rectally every 3 (three) days.  . Calcium Carbonate-Vitamin D3 (CALCIUM 600-D) 600-400 MG-UNIT TABS Take 1 tablet by mouth daily.  . Carboxymethylcellulose Sodium (ARTIFICIAL TEARS OP) Apply 1 drop to eye 3 (three) times daily.   . cefUROXime (CEFTIN) 500 MG tablet Take 1 tablet (500 mg total) by mouth 2 (two) times daily with a meal.  . cetirizine (ZYRTEC) 10 MG tablet Take 10 mg by mouth daily.  . Chlorhexidine Gluconate Cloth 2 % PADS Apply 6 each topically daily at 6 (six) AM.  . Cholecalciferol (VITAMIN D3) 30 MCG/15ML LIQD Take by mouth. Vitamin D 3 2000 IU cap ; 2 caps qd  . Cranberry 425 MG CAPS Take by mouth daily.  . Cyanocobalamin (VITAMIN B 12 PO) Take 1,000 mcg by mouth daily.  Marland Kitchen docusate sodium (STOOL SOFTENER) 100 MG capsule Take 100 mg by mouth daily as needed for mild constipation.  . iron polysaccharides (NIFEREX) 150 MG capsule Take 150 mg by mouth 2 (two) times daily.  Marland Kitchen lactobacillus acidophilus (BACID) TABS tablet Take 2 tablets by mouth daily.  Marland Kitchen latanoprost (XALATAN) 0.005 % ophthalmic solution Place 1 drop into both eyes at bedtime.  . Melatonin 10 MG TABS Take 10 mg by mouth at bedtime.  . mineral oil light external liquid Apply 2 application topically once a week. Two gtts each ear weekly  . mupirocin ointment (BACTROBAN) 2 % Place 1 application into the nose 2 (two) times daily.  Marland Kitchen NIFEdipine (PROCARDIA XL/ADALAT-CC) 60 MG 24 hr tablet Take 60 mg by mouth daily.  Marland Kitchen nystatin (MYCOSTATIN/NYSTOP) powder Apply 1 g topically 3 (three) times daily.  . Omega-3 Fatty Acids (FISH OIL) 1200 MG CAPS Take 1 capsule by mouth daily.  . pantoprazole (PROTONIX) 40 MG tablet Take 1 tablet (40 mg total) by mouth 2 (two) times daily before a meal.  . polyethylene glycol (MIRALAX / GLYCOLAX) packet Take 17 g by mouth daily as needed. (Patient taking differently: Take 17 g by mouth daily. )  . potassium chloride (K-DUR,KLOR-CON) 10 MEQ tablet Take 10 mEq by mouth daily with breakfast.  . sertraline (ZOLOFT) 50 MG tablet Take 50 mg by mouth daily with breakfast.  . tiZANidine (ZANAFLEX) 2 MG tablet Take 1 tablet (2 mg total) by mouth every 8 (eight) hours as needed for muscle spasms.  Marland Kitchen torsemide (DEMADEX) 10 MG  tablet Take 15 mg by mouth See admin instructions. 1.5 tabs (15mg ) qd  . zolpidem (AMBIEN) 10 MG tablet Take 10 mg by mouth at bedtime.   No facility-administered encounter medications on file as of 02/04/2019.    PHYSICAL EXAM:   General: NAD, well nourished. A & O. Pleasantly conversant. HOH. Sitting in her lounge chair reading.  Cardiovascular: regular rate and rhythm. Soft systolic murmur.  Pulmonary: Clear anterior/posterior lung fields Abdomen: soft, nontender, + bowel sounds GU: no suprapubic tenderness Extremities: Lower extremity compression wraps in place. No joint deformities Skin: exposed skin without rashes.   Neurological: non-focal. Resolution of raised bump on the back of her head.    Julianne Handler, NP

## 2019-02-05 ENCOUNTER — Other Ambulatory Visit: Payer: Self-pay

## 2019-02-20 ENCOUNTER — Non-Acute Institutional Stay: Payer: Medicare Other | Admitting: Internal Medicine

## 2019-02-20 ENCOUNTER — Other Ambulatory Visit: Payer: Self-pay

## 2019-02-20 ENCOUNTER — Encounter: Payer: Self-pay | Admitting: Internal Medicine

## 2019-02-20 VITALS — Wt 162.8 lb

## 2019-02-20 DIAGNOSIS — Z7189 Other specified counseling: Secondary | ICD-10-CM

## 2019-02-20 DIAGNOSIS — Z515 Encounter for palliative care: Secondary | ICD-10-CM

## 2019-02-20 NOTE — Progress Notes (Addendum)
Jan 12th, 2021 [facility] Palliative Care Consult Note Telephone: [phone]  Fax: [phone]   PATIENT NAME: Jamie Brown DOB: 04-03-1924 MRN: [id number] [facility] [id number] (move in date 11/07/2016)   PRIMARY CARE PROVIDER:    Javier Glazier, MD [address] [place] [address]   Dr. Paulita Fujita [facility] GI)   REFERRING PROVIDER:  Delle Reining, NP  RESPONSIBLE PARTY: (dtr/HCPOA) Massie Kluver ([phone] ([phone] or [phone] [email or web address]. Address: [address] [place]  (granddaughter) Mahala Menghini [phone]    ASSESSMENT / RECOMMENDATIONS:  1. Advance Care Planning:   A. Directives: MOST: DNR/DNI. Limited use of Additional Interventions. Determine use of IVFs and antibiotics (antbx for rx of UTI or as comfort measure) at time of need. No tube feeding.     B. Goals of Care: Continues happy at [place] [facility], despite COVID restrictions.  2. Fall:  Patient with fall this am, as reaching to plug in a device. Found by staff sitting on her bottom at the foot of her recliner. Patient reported she may have struck the right lower posterior part of her head. No signs of bruising or swelling. Mildly tender to touch. Neruo exam grossly normal and at her baseline.   -Consider repeat UA/C&S, as historically patient sympathetic with UTI's save for weakness and confusion. She has, however, just completed a course of Bactrim DS for recent UTI.  -encouraged her to call for assistance when needs to bend over or reach for out of reach item.   3. Dyspnea with exertion: Resting oxygen saturations 94%. However, after ambulating about 75 ft, she c/o dyspnea, fatigue. Desaturates to 87%.  -qualifies for  oxygen via [place] when ambulating.   4. Weight loss: Weight Jan 4th was 162.8lbs, which is down 5.5lbs lbs over the last month but stable from 4 months earlier. Patient reports improved appetite lately.   5. Low back pain: well  managed with prn Tylenol   6. Family Supports: Daughter Izora Gala in close contact. Patient has two sons in [place] ([place] and [place]). 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   7. Follow up Palliative Care Visit: within the next 2 months or so. I did call daughter Izora Gala with updates. She has my contact information and can also call me on a prn basis.    I spent 60 minutes providing this consultation from 10:30am - 11:30am. More than 50% of the time in this consultation was spent coordinating communication.    HISTORY OF PRESENT ILLNESS:  Jamie Brown is a 86 y.o. 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 No w/u planned), OA,  cervical spinal injury (dens fx) after a fall.  Acute post hemorrhagic anemia. Hospital admission 9/30-10/04/2016 for UGIB (Hgb 6.9). Tx 2 u's PRBCs. CT negative for retroperitoneal bleeding. Plan chronic PPI.   This is a f/u Palliative Care visit form 02/04/2019.    CODE STATUS: DNR   PPS: 40%   HOSPICE ELIGIBILITY/DIAGNOSIS: TBD  PAST MEDICAL HISTORY:  Past Medical History:  Diagnosis Date  . Arthritis    OA  . Coronary artery disease   . GI bleeding 10/23/2016  . High cholesterol   . HTN (hypertension) 05/28/2015  . Hypertension   . Radiation 11/19/14-12/30/14   upper right neck 5000 cGy  . Squamous cell  carcinoma     SOCIAL HX:  Social History   Tobacco Use  . Smoking status: Never Smoker  . Smokeless tobacco: Never Used  Substance Use Topics  . Alcohol use: No    ALLERGIES:  Allergies  Allergen Reactions  . Codeine Itching    Hives  . Gabapentin Other (See Comments)    Difficulty finding words and foggy feeling  . Isoptin [Verapamil] Itching  . Remeron [Mirtazapine] Other (See Comments)    Muscle cramping and pain  . Trazodone And Nefazodone Other (See Comments)     Mental confusion  . Aldara [Imiquimod] Rash    Redness      PERTINENT MEDICATIONS:  Outpatient Encounter Medications as of 02/20/2019  Medication Sig  . acetaminophen (TYLENOL) 325 MG tablet Take 650 mg by mouth every 6 (six) hours as needed for mild pain.  Marland Kitchen albuterol (VENTOLIN HFA) 108 (90 Base) MCG/ACT inhaler Inhale 1 puff into the lungs 2 (two) times daily.  . bisacodyl (DULCOLAX) 10 MG suppository Place 10 mg rectally every 3 (three) days.  . Calcium Carbonate-Vitamin D3 (CALCIUM 600-D) 600-400 MG-UNIT TABS Take 1 tablet by mouth daily.  . Carboxymethylcellulose Sodium (ARTIFICIAL TEARS OP) Apply 1 drop to eye 3 (three) times daily.  . cefUROXime (CEFTIN) 500 MG tablet Take 1 tablet (500 mg total) by mouth 2 (two) times daily with a meal.  . cetirizine (ZYRTEC) 10 MG tablet Take 10 mg by mouth daily.  . Chlorhexidine Gluconate Cloth 2 % PADS Apply 6 each topically daily at 6 (six) AM.  . Cholecalciferol (VITAMIN D3) 30 MCG/15ML LIQD Take by mouth. Vitamin D 3 2000 IU cap ; 2 caps qd  . Cranberry 425 MG CAPS Take by mouth daily.  . Cyanocobalamin (VITAMIN B 12 PO) Take 1,000 mcg by mouth daily.  Marland Kitchen docusate sodium (STOOL SOFTENER) 100 MG capsule Take 100 mg by mouth daily as needed for mild constipation.  . iron polysaccharides (NIFEREX) 150 MG capsule Take 150 mg by mouth 2 (two) times daily.  Marland Kitchen lactobacillus acidophilus (BACID) TABS tablet Take 2 tablets by mouth daily.  Marland Kitchen latanoprost (XALATAN) 0.005 % ophthalmic solution Place 1 drop into both eyes at bedtime.  . Melatonin 10 MG TABS Take 10 mg by mouth at bedtime.  . mineral oil light external liquid Apply 2 application topically once a week. Two gtts each ear weekly  . mupirocin ointment (BACTROBAN) 2 % Place 1 application into the nose 2 (two) times daily.  Marland Kitchen NIFEdipine (PROCARDIA XL/ADALAT-CC) 60 MG 24 hr tablet Take 60 mg by mouth daily.  Marland Kitchen nystatin (MYCOSTATIN/NYSTOP) powder Apply 1 g topically 3 (three) times daily.  . Omega-3  Fatty Acids (FISH OIL) 1200 MG CAPS Take 1 capsule by mouth daily.  . pantoprazole (PROTONIX) 40 MG tablet Take 1 tablet (40 mg total) by mouth 2 (two) times daily before a meal.  . polyethylene glycol (MIRALAX / GLYCOLAX) packet Take 17 g by mouth daily as needed. (Patient taking differently: Take 17 g by mouth daily. )  . potassium chloride (K-DUR,KLOR-CON) 10 MEQ tablet Take 10 mEq by mouth daily with breakfast.  . sertraline (ZOLOFT) 50 MG tablet Take 50 mg by mouth daily with breakfast.  . tiZANidine (ZANAFLEX) 2 MG tablet Take 1 tablet (2 mg total) by mouth every 8 (eight) hours as needed for muscle spasms.  Marland Kitchen torsemide (DEMADEX) 10 MG tablet Take 15 mg by mouth See admin instructions. 1.5 tabs (15mg ) qd  . zolpidem (AMBIEN) 10 MG tablet  Take 10 mg by mouth at bedtime.   No facility-administered encounter medications on file as of 02/20/2019.    PHYSICAL EXAM:   General: NAD, well nourished. A & O. Pleasantly conversant. HOH. Sitting in her lounge chair asleep; awoke easily to my voice.  Neck: soft mass right neck unchanged. No tracheal deviation. PE deferred to decreased risk of COVID transmission. Extremities: Lower extremity compression wraps in place. No joint deformities Skin: exposed skin without rashes.   Neurological: non-focal. Resolution of raised bump on the back of her head.    Julianne Handler, NP

## 2019-05-06 ENCOUNTER — Encounter: Payer: Self-pay | Admitting: Internal Medicine

## 2019-05-06 ENCOUNTER — Other Ambulatory Visit: Payer: Self-pay

## 2019-05-06 ENCOUNTER — Non-Acute Institutional Stay: Payer: Medicare Other | Admitting: Internal Medicine

## 2019-05-06 DIAGNOSIS — Z515 Encounter for palliative care: Secondary | ICD-10-CM

## 2019-05-06 DIAGNOSIS — Z7189 Other specified counseling: Secondary | ICD-10-CM

## 2019-05-06 NOTE — Progress Notes (Signed)
April 13th, 2021 [facility] Palliative Care Consult Note Telephone: [phone]  Fax: [phone]   PATIENT NAME: Jamie Brown DOB: 1924-11-11 MRN: [id number] [facility] [id number] (move in date 11/07/2016)   PRIMARY CARE PROVIDER:    Javier Glazier, MD [address] [place] [address]   Dr. Paulita Fujita [facility] GI)   REFERRING PROVIDER:  Delle Reining, NP   RESPONSIBLE PARTY: (dtr/HCPOA) Massie Kluver ([phone] (M) [phone] or [phone] [email or web address]. Address: [address] [place]  (granddaughter) Mahala Menghini [phone]  ASSESSMENT / RECOMMENDATIONS:  1. Advance Care Planning:              A. Directives: DNT, MOST: DNR/DNI. Limited use of Additional Interventions. Determine use of IVFs and antibiotics (antbx for rx of UTI or as comfort measure) at time of need. No tube feeding.                B. Goals of Care: Continues happy at [name] Her biggest sources of enjoyment are her visits with her daughter Izora Gala and her reading.  2. Cognitive/functional status Patient is alert and oriented X 3. She is independent in transfers. Ambulates consistently with rollator walker. Independent in ADLs with standby assist for showers. She has an occasional fall when reaching for items. She falls approximately once a month. So far no injuries save an occasional cut.  Mild lower extremity swelling, she has some compression wraps in place.  Good appetite, ambulates to dining room for meals. Stays in a room for breakfast and supper per her preference. She is continent of bowel and bladder.  Continues couch potato existence . She's really not interested in any exercise program. Occasionally will participate in facility exercises/social activities.    3. Mild dyspnea with exertion: Resting oxygen saturations 94%. I ambulated with her distance of about 200 ft or so and she maintained her sats mid to high 90's with HR 60's. Able  to converse during her walk without undue dyspnea. CXR 02/24/2019:No acute pulmonary abnormality             4. Low back pain: well managed with prn Tylenol    5. Family Supports: Daughter Izora Gala in close contact. Patient has two sons in [place] ([place] and [place]). 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    6. Follow up Palliative Care Visit: within the next 2 months or so. I did call daughter Izora Gala with updates. She has my contact information and can also call me on a prn basis.    I spent 60 minutes providing this consultation from 10:30am - 11:30am. More than 50% of the time in this consultation was spent coordinating communication.    HISTORY OF PRESENT ILLNESS:  Jamie Brown is a 86 y.o. 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 No w/u planned), OA,  cervical spinal injury (dens fx) after a fall.  Acute post hemorrhagic anemia. Hospital admission 9/30-10/04/2016 for UGIB (Hgb 6.9). Tx 2 u's PRBCs. CT negative for retroperitoneal bleeding. Plan chronic PPI.   This is a f/u Palliative Care visit form 02/20/2019.    CODE STATUS: DNR   PPS: 40%   HOSPICE ELIGIBILITY/DIAGNOSIS: TBD  PAST MEDICAL HISTORY:  Past Medical History:  Diagnosis Date  . Arthritis    OA  . Coronary  artery disease   . GI bleeding 10/23/2016  . High cholesterol   . HTN (hypertension) 05/28/2015  . Hypertension   . Radiation 11/19/14-12/30/14   upper right neck 5000 cGy  . Squamous cell carcinoma     SOCIAL HX:  Social History   Tobacco Use  . Smoking status: Never Smoker  . Smokeless tobacco: Never Used  Substance Use Topics  . Alcohol use: No    ALLERGIES:  Allergies  Allergen Reactions  . Codeine Itching    Hives  . Gabapentin Other (See Comments)    Difficulty finding words and foggy feeling  . Isoptin [Verapamil]  Itching  . Remeron [Mirtazapine] Other (See Comments)    Muscle cramping and pain  . Trazodone And Nefazodone Other (See Comments)    Mental confusion  . Aldara [Imiquimod] Rash    Redness      PERTINENT MEDICATIONS:  Outpatient Encounter Medications as of 05/06/2019  Medication Sig  . acetaminophen (TYLENOL) 325 MG tablet Take 650 mg by mouth every 6 (six) hours as needed for mild pain.  Marland Kitchen albuterol (VENTOLIN HFA) 108 (90 Base) MCG/ACT inhaler Inhale 1 puff into the lungs 2 (two) times daily.  . bisacodyl (DULCOLAX) 10 MG suppository Place 10 mg rectally every 3 (three) days.  . Calcium Carbonate-Vitamin D3 (CALCIUM 600-D) 600-400 MG-UNIT TABS Take 1 tablet by mouth daily.  . Carboxymethylcellulose Sodium (ARTIFICIAL TEARS OP) Apply 1 drop to eye 3 (three) times daily.  . cefUROXime (CEFTIN) 500 MG tablet Take 1 tablet (500 mg total) by mouth 2 (two) times daily with a meal.  . cetirizine (ZYRTEC) 10 MG tablet Take 10 mg by mouth daily.  . Chlorhexidine Gluconate Cloth 2 % PADS Apply 6 each topically daily at 6 (six) AM.  . Cholecalciferol (VITAMIN D3) 30 MCG/15ML LIQD Take by mouth. Vitamin D 3 2000 IU cap ; 2 caps qd  . Cranberry 425 MG CAPS Take by mouth daily.  . Cyanocobalamin (VITAMIN B 12 PO) Take 1,000 mcg by mouth daily.  Marland Kitchen docusate sodium (STOOL SOFTENER) 100 MG capsule Take 100 mg by mouth daily as needed for mild constipation.  . iron polysaccharides (NIFEREX) 150 MG capsule Take 150 mg by mouth 2 (two) times daily.  Marland Kitchen lactobacillus acidophilus (BACID) TABS tablet Take 2 tablets by mouth daily.  Marland Kitchen latanoprost (XALATAN) 0.005 % ophthalmic solution Place 1 drop into both eyes at bedtime.  . Melatonin 10 MG TABS Take 10 mg by mouth at bedtime.  . mineral oil light external liquid Apply 2 application topically once a week. Two gtts each ear weekly  . mupirocin ointment (BACTROBAN) 2 % Place 1 application into the nose 2 (two) times daily.  Marland Kitchen NIFEdipine (PROCARDIA XL/ADALAT-CC) 60  MG 24 hr tablet Take 60 mg by mouth daily.  Marland Kitchen nystatin (MYCOSTATIN/NYSTOP) powder Apply 1 g topically 3 (three) times daily.  . Omega-3 Fatty Acids (FISH OIL) 1200 MG CAPS Take 1 capsule by mouth daily.  . pantoprazole (PROTONIX) 40 MG tablet Take 1 tablet (40 mg total) by mouth 2 (two) times daily before a meal.  . polyethylene glycol (MIRALAX / GLYCOLAX) packet Take 17 g by mouth daily as needed. (Patient taking differently: Take 17 g by mouth daily. )  . potassium chloride (K-DUR,KLOR-CON) 10 MEQ tablet Take 10 mEq by mouth daily with breakfast.  . sertraline (ZOLOFT) 50 MG tablet Take 50 mg by mouth daily with breakfast.  . tiZANidine (ZANAFLEX) 2 MG tablet Take 1 tablet (2 mg total)  by mouth every 8 (eight) hours as needed for muscle spasms.  Marland Kitchen torsemide (DEMADEX) 10 MG tablet Take 15 mg by mouth See admin instructions. 1.5 tabs (15mg ) qd  . zolpidem (AMBIEN) 10 MG tablet Take 10 mg by mouth at bedtime.   No facility-administered encounter medications on file as of 05/06/2019.    PHYSICAL EXAM:   Well nourished, pleasantly conversant, alert and oriented. Was standing by the sink washing her hands. Cardiovascular: regular rate and rhythm. Systolic murmur Pulmonary: clear ant fields Abdomen: soft, nontender, + bowel sounds GU: no suprapubic tenderness Extremities: mild LE edema, compression braces in place bilateral knee to ankle Skin: no rashes Neurological: Weakness but otherwise nonfocal  Julianne Handler, NP

## 2019-11-04 ENCOUNTER — Other Ambulatory Visit: Payer: Self-pay

## 2019-11-04 ENCOUNTER — Non-Acute Institutional Stay: Payer: Medicare Other | Admitting: Nurse Practitioner

## 2019-11-04 DIAGNOSIS — Z515 Encounter for palliative care: Secondary | ICD-10-CM

## 2019-11-04 NOTE — Progress Notes (Signed)
[facility] Consult Note Telephone: [phone]  Fax: [phone]  PATIENT NAME: BARABARA MOTZ [address] [place] [address] [phone] (home)  DOB: Aug 28, 84 1926 MRN: [id number]  PRIMARY CARE PROVIDER:    Javier Glazier, MD,  [address] [place] [address] [phone]  REFERRING PROVIDER:   Delle Reining, NP  RESPONSIBLE PARTY:   Extended Emergency Contact Information Primary Emergency Contact: Reid,Nancy Address: [address]          [place]          [place], [place] [address] Johnnette Litter of [place] Phone: [phone] Mobile Phone: [phone] Relation: Daughter  I met face to face with patient in facility.  ASSESSMENT AND RECOMMENDATIONS:   1. Advance Care Planning/Goals of Care:  Goals of Care: Goal of care is function. Patient happy with her home at [facility]. Her sources of enjoyment are her visits with her daughter Izora Gala and her reading. Directives: Patient code status is DNR. She reiterated her desire to not be resuscitated in the event of cardiac or respiratory arrest. Signed DNR form and MOST form in facility and on [facility] EMR. MOST details include; limited additional intervention, determine use or limitation of antibiotics, IV fluids for a defined period, no feeding tube.  2. Symptom Management:  Staff report patient on antibiotics for UTI, she being treated empirically due to change in mental status and behavior issues. Her urine grew less than 100,000 colonies of E-coli. No report of fever, chills, or SOB.  Patient with recent diagnosis of squamous cell carcinoma on her right side of her face. Of note patient has a mass on right neck that is being monitored without plan for work up. Daughter initially expressed concern as it was said that patient may have to have a Mohs surgery. Family report patient had mohs surgery and 30 rounds of radiation 5 years ago for cancer on her right  neck area, report the procedure as being tasking on patient given her advanced age. Daughter report she received a call today from the PA at the dermatology office saying patient does not need to have the Mohs surgery as there are other options that can be explored. The risk of the surgery was said to outweigh it's benefit for patient. Daughter verbalized relief as she was concerned about her mothers safety and comfort. Daughter report her mother to be frail and more forgetful. Patient does not appear acutely ill on exam. Patient was noted sitting on a chair in her room eating ice cream at the time of visit today. She report feeling well and surprised at the visit. She asked several times why I was visiting her and who sent me. Patient denied pain, history of low back pain controlled with as needed Tylenol. Palliative care will continue to provide support to patient, family and the medical team.   3. Follow up Palliative Care Visit: Palliative care will continue to follow for goals of care clarification and symptom management. Return in about 4 weeks or prn.  4. Family /Caregiver/Community Supports: Daughter Izora Gala is very involved in her care. Patient has two sons in [place] ([place] and [place]). 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 She goes to dinning twice for meals and socialization.  5. Cognitive / Functional decline: Patient awake and alert, cooperative and coherent. Patient ambulates with wheeled walker. She requires minimum assist ADLs,  mostly standby assist. Patient continent of bowel, has incontinence of bladder. No report of recent fall.  I spent 48 minutes providing this consultation, time includes time spent with patient/family, chart review, provider coordination, and documentation. More than 50% of the time in this consultation was spent coordinating communication.   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 No work up planned), OA, cervical spinal injury from fall. This is a follow up visit.  CODE STATUS: DNR  PPS: 40%  HOSPICE ELIGIBILITY/DIAGNOSIS: TBD  PAST MEDICAL HISTORY:  Past Medical History:  Diagnosis Date  . Arthritis    OA  . Coronary artery disease   . GI bleeding 10/23/2016  . High cholesterol   . HTN (hypertension) 05/28/2015  . Hypertension   . Radiation 11/19/14-12/30/14   upper right neck 5000 cGy  . Squamous cell carcinoma     SOCIAL HX:  Social History   Tobacco Use  . Smoking status: Never Smoker  . Smokeless tobacco: Never Used  Substance Use Topics  . Alcohol use: No   FAMILY HX: No family history on file.  ALLERGIES:  Allergies  Allergen Reactions  . Codeine Itching    Hives  . Gabapentin Other (See Comments)    Difficulty finding words and foggy feeling  . Isoptin [Verapamil] Itching  . Remeron [Mirtazapine] Other (See Comments)    Muscle cramping and pain  . Trazodone And Nefazodone Other (See Comments)    Mental confusion  . Aldara [Imiquimod] Rash    Redness      PERTINENT MEDICATIONS:  Outpatient Encounter Medications as of 11/04/2019  Medication Sig  . acetaminophen (TYLENOL) 325 MG tablet Take 650 mg by mouth every 6 (six) hours as needed for mild pain.  Marland Kitchen albuterol (VENTOLIN HFA) 108 (90 Base) MCG/ACT inhaler Inhale 1 puff into the lungs 2 (two) times daily.  . bisacodyl (DULCOLAX) 10 MG suppository Place 10 mg rectally every 3 (three) days.  . Calcium Carbonate-Vitamin D3 (CALCIUM 600-D) 600-400 MG-UNIT TABS Take 1 tablet by mouth daily.  . Carboxymethylcellulose Sodium (ARTIFICIAL TEARS OP) Apply 1 drop to eye 3 (three) times daily.  . cefUROXime (CEFTIN) 500 MG tablet Take 1 tablet (500 mg total) by mouth 2 (two) times daily with a  meal.  . cetirizine (ZYRTEC) 10 MG tablet Take 10 mg by mouth daily.  . Chlorhexidine Gluconate Cloth 2 % PADS Apply 6 each topically daily at 6 (six) AM.  . Cholecalciferol (VITAMIN D3) 30 MCG/15ML LIQD Take by mouth. Vitamin D 3 2000 IU cap ; 2 caps qd  . Cranberry 425 MG CAPS Take by mouth daily.  . Cyanocobalamin (VITAMIN B 12 PO) Take 1,000 mcg by mouth daily.  Marland Kitchen docusate sodium (STOOL SOFTENER) 100 MG capsule Take 100 mg by mouth daily as needed for mild constipation.  . iron polysaccharides (NIFEREX) 150 MG capsule Take 150 mg by mouth 2 (two) times daily.  Marland Kitchen lactobacillus acidophilus (BACID) TABS tablet Take 2 tablets by mouth daily.  Marland Kitchen latanoprost (XALATAN) 0.005 % ophthalmic solution Place 1 drop into both eyes at bedtime.  . Melatonin 10 MG TABS Take 10 mg by mouth at bedtime.  . mineral oil light external liquid Apply 2 application topically once a week. Two gtts each ear weekly  . mupirocin ointment (BACTROBAN) 2 % Place 1 application into  the nose 2 (two) times daily.  Marland Kitchen NIFEdipine (PROCARDIA XL/ADALAT-CC) 60 MG 24 hr tablet Take 60 mg by mouth daily.  Marland Kitchen nystatin (MYCOSTATIN/NYSTOP) powder Apply 1 g topically 3 (three) times daily.  . Omega-3 Fatty Acids (FISH OIL) 1200 MG CAPS Take 1 capsule by mouth daily.  . pantoprazole (PROTONIX) 40 MG tablet Take 1 tablet (40 mg total) by mouth 2 (two) times daily before a meal.  . polyethylene glycol (MIRALAX / GLYCOLAX) packet Take 17 g by mouth daily as needed. (Patient taking differently: Take 17 g by mouth daily. )  . potassium chloride (K-DUR,KLOR-CON) 10 MEQ tablet Take 10 mEq by mouth daily with breakfast.  . sertraline (ZOLOFT) 50 MG tablet Take 50 mg by mouth daily with breakfast.  . tiZANidine (ZANAFLEX) 2 MG tablet Take 1 tablet (2 mg total) by mouth every 8 (eight) hours as needed for muscle spasms.  Marland Kitchen torsemide (DEMADEX) 10 MG tablet Take 15 mg by mouth See admin instructions. 1.5 tabs (28m) qd  . zolpidem (AMBIEN) 10 MG  tablet Take 10 mg by mouth at bedtime.   No facility-administered encounter medications on file as of 11/04/2019.    PHYSICAL EXAM / ROS:   Current and past weights: mostly stable, 166.6lbs up from 164.2lbs last month,  General: frail appearing, coherent and cooperative, sitting in chair in her room in no acute distress. Cardiovascular:denied chest pain, +1 edema, Ted hose on Pulmonary: no cough, no SOB, room air Abdomen: appetite good, denied constipation, continent of bowel GU: denies dysuria, continent of urine MSK:  no joint and ROM abnormalities, ambulatory Skin: bandage on biopsy site left sided os face, dried lesions on face and neck and anterior chest. Neurological: weakness, but otherwise nonfocal  QJari Favre DNP, AGPCNP-BC

## 2020-07-06 ENCOUNTER — Telehealth: Payer: Self-pay

## 2020-07-06 NOTE — Telephone Encounter (Signed)
Received message from Booker at [facility] re: patient decline. Phone call placed to [facility]. Spoke with Anita/med tech who stated that patient is currently on antibiotics for urinary infection. Rodena Piety noted that patient is having some confusion. Message sent to Miranda, [facility], at [facility] as well. Will F/U with Palliative NP

## 2020-07-07 ENCOUNTER — Other Ambulatory Visit: Payer: Self-pay

## 2020-07-07 ENCOUNTER — Non-Acute Institutional Stay: Payer: Medicare Other | Admitting: Nurse Practitioner

## 2020-07-07 DIAGNOSIS — Z515 Encounter for palliative care: Secondary | ICD-10-CM

## 2020-07-07 DIAGNOSIS — G3184 Mild cognitive impairment, so stated: Secondary | ICD-10-CM

## 2020-07-07 NOTE — Progress Notes (Signed)
[facility] Consult Note Telephone: [phone]  Fax: [phone]    Date of encounter: 07/07/20 PATIENT NAME: Jamie Brown [address] [place] [address]   [phone] (home)  DOB: [birth year] MRN: [id number]  PRIMARY CARE PROVIDER:    Delle Reining, NP (facility provider)  REFERRING PROVIDER:   Delle Reining, NP (facility provider)  RESPONSIBLE PARTY:    Contact Information     Name Relation Home Work Mobile   Jamie Brown,Jamie Brown Daughter [phone]  [phone]     I met face to face with patient in facility. Palliative Care was asked to follow this patient by consultation request of  Delle Reining, NP to address advance care planning and complex medical decision making. This is a follow up visit.                                   ASSESSMENT AND PLAN / RECOMMENDATIONS:   Advance Care Planning/Goals of Care:  CODE STATUS: DNR Goal of care: Patient's goal of care is comfort while preserving function. Directives: Signed DNR form and MOST form s present on file in the facility and on [facility] EMR. Details of MOST form include; limited additional intervention, determine use or limitation of antibiotics, IV fluids for a defined period, no feeding tube.   Symptom Management/Plan: Mild cognitive impairment: Patient awake and alert, cooperative during visit, actively participated in visit discussion. Continue supportive care. Maintain safety, prevent falls. Continue to encourage consistent use of assistive device during ambulation. Encourage participation in facility activities to stimulate patient.  Discussed visit findings with patient's daughter Jamie Brown. Discussed and reviewed criteria for Hospice care to include increased somnolence, confusion, decreased oral intake, weight loss, inability to ambulate, or acute illness without plans for hospital evaluation for work up and acute intervention. Jamie Brown made aware that patient not  appropriate for Hospice care at his time. Jamie Brown verbalized understanding. Questions and concerns were addressed Jamie Brown was encouraged to call with questions and/or concerns. Provided general support and encouragement, no other unmet need identified at this time.  Follow up Palliative Care Visit: Palliative care will continue to follow for complex medical decision making, advance care planning, and clarification of goals. Return as needed.  I spent 48 minutes providing this consultation. More than 50% of the time in this consultation was spent in counseling and care coordination.  PPS: 50%  HOSPICE ELIGIBILITY/DIAGNOSIS: TBD  CHIEF COMPLAIN: follow up palliative care visit   History obtained from review of Epic EMR, discussion with facility staff, and interview with Jamie Brown and on phone with daughter Jamie Brown.  HISTORY OF PRESENT ILLNESS:  Jamie Brown is a 87 y.o. year old female with HTN, depression/anxiety, osteoarthritis, patient with history of recurrent skin cancer, cervical spinal injury from fall. Staff report patient with increasing short term memory loss leading to falls, as patient forgets to use her call pendant and walker during ambulation. Patient report feeling well during visit today. She was awake and alert, oriented x 3. She is able to complete her ADLs independently. She is currently on antibiotics for tooth infection, tolerating it well. She scheduled for tooth extraction next week. Patient denied chewing or swallowing problems today. No report of weight loss., BMI 31.9. No report of fever, chills, or SOB.  Ten systems reviewed and are negative for acute change, except as noted in the HPI. Staff report patient's daughter  requested for Hospice care for patient, staff report she was made aware that patient may not be appropriate for services at this time.  I reviewed available labs, medications, imaging, studies and related documents from the EMR.  Records reviewed and summarized  above.   Physical Exam: Current and past weights: 169.8lb, Ht 5'1", BMI 31. 9kg/m2 General: frail appearing, cooperative, sitting in chair in her room in NAD EYES: anicteric sclera, no discharge  ENMT: intact hearing, oral mucous membranes moist CV: S1S2 normal, no LE edema Pulmonary: LCTA, no increased work of breathing, no cough, room air Abdomen: intake 100%, no ascites GU: deferred MSK: moderate sarcopenia, moves all extremities, ambulatory Skin: warm and dry, no rashes or wounds on visible skin Neuro: generalized weakness,  no cognitive impairment Psych: non-anxious affect, A and O x 3 Hem/lymph/immuno: no widespread bruising  Past Medical History:  Diagnosis Date   Arthritis    OA   Coronary artery disease    GI bleeding 10/23/2016   High cholesterol    HTN (hypertension) 05/28/2015   Hypertension    Radiation 11/19/14-12/30/14   upper right neck 5000 cGy   Squamous cell carcinoma     Thank you for the opportunity to participate in the care of Jamie Brown.  The palliative care team will continue to follow. Please call our office at [phone] if we can be of additional assistance.   Jari Favre, DNP, AGPCNP-BC  COVID-19 PATIENT SCREENING TOOL Asked and negative response unless otherwise noted:   Have you had symptoms of covid, tested positive or been in contact with someone with symptoms/positive test in the past 5-10 days?

## 2020-09-28 ENCOUNTER — Emergency Department (HOSPITAL_BASED_OUTPATIENT_CLINIC_OR_DEPARTMENT_OTHER): Payer: Medicare Other

## 2020-09-28 ENCOUNTER — Encounter (HOSPITAL_BASED_OUTPATIENT_CLINIC_OR_DEPARTMENT_OTHER): Payer: Self-pay | Admitting: Emergency Medicine

## 2020-09-28 ENCOUNTER — Other Ambulatory Visit: Payer: Self-pay

## 2020-09-28 ENCOUNTER — Emergency Department (HOSPITAL_BASED_OUTPATIENT_CLINIC_OR_DEPARTMENT_OTHER)
Admission: EM | Admit: 2020-09-28 | Discharge: 2020-09-28 | Disposition: A | Discharge status: Home / Self Care | Payer: Medicare Other | Attending: Emergency Medicine | Admitting: Emergency Medicine

## 2020-09-28 DIAGNOSIS — S4980XA Other specified injuries of shoulder and upper arm, unspecified arm, initial encounter: Secondary | ICD-10-CM | POA: Diagnosis not present

## 2020-09-28 DIAGNOSIS — W06XXXA Fall from bed, initial encounter: Secondary | ICD-10-CM | POA: Diagnosis not present

## 2020-09-28 DIAGNOSIS — Z79899 Other long term (current) drug therapy: Secondary | ICD-10-CM | POA: Insufficient documentation

## 2020-09-28 DIAGNOSIS — I10 Essential (primary) hypertension: Secondary | ICD-10-CM | POA: Insufficient documentation

## 2020-09-28 DIAGNOSIS — S50821A Blister (nonthermal) of right forearm, initial encounter: Secondary | ICD-10-CM | POA: Diagnosis not present

## 2020-09-28 DIAGNOSIS — I2510 Atherosclerotic heart disease of native coronary artery without angina pectoris: Secondary | ICD-10-CM | POA: Insufficient documentation

## 2020-09-28 DIAGNOSIS — F0390 Unspecified dementia without behavioral disturbance: Secondary | ICD-10-CM | POA: Insufficient documentation

## 2020-09-28 DIAGNOSIS — Z0189 Encounter for other specified special examinations: Secondary | ICD-10-CM

## 2020-09-28 DIAGNOSIS — S45301A Unspecified injury of superficial vein at shoulder and upper arm level, right arm, initial encounter: Secondary | ICD-10-CM | POA: Diagnosis not present

## 2020-09-28 DIAGNOSIS — T148XXA Other injury of unspecified body region, initial encounter: Secondary | ICD-10-CM

## 2020-09-28 DIAGNOSIS — Z23 Encounter for immunization: Secondary | ICD-10-CM | POA: Insufficient documentation

## 2020-09-28 DIAGNOSIS — W19XXXA Unspecified fall, initial encounter: Secondary | ICD-10-CM

## 2020-09-28 DIAGNOSIS — Z20822 Contact with and (suspected) exposure to covid-19: Secondary | ICD-10-CM | POA: Diagnosis not present

## 2020-09-28 LAB — CBC WITH DIFFERENTIAL/PLATELET
Abs Immature Granulocytes: 0.02 10*3/uL (ref 0.00–0.07)
Basophils Absolute: 0.1 10*3/uL (ref 0.0–0.1)
Basophils Relative: 1 %
Eosinophils Absolute: 0.2 10*3/uL (ref 0.0–0.5)
Eosinophils Relative: 2 %
HCT: 42.7 % (ref 36.0–46.0)
Hemoglobin: 13.8 g/dL (ref 12.0–15.0)
Immature Granulocytes: 0 %
Lymphocytes Relative: 32 %
Lymphs Abs: 2.7 10*3/uL (ref 0.7–4.0)
MCH: 27.4 pg (ref 26.0–34.0)
MCHC: 32.3 g/dL (ref 30.0–36.0)
MCV: 84.9 fL (ref 80.0–100.0)
Monocytes Absolute: 0.8 10*3/uL (ref 0.1–1.0)
Monocytes Relative: 9 %
Neutro Abs: 4.8 10*3/uL (ref 1.7–7.7)
Neutrophils Relative %: 56 %
Platelets: 362 10*3/uL (ref 150–400)
RBC: 5.03 MIL/uL (ref 3.87–5.11)
RDW: 14.1 % (ref 11.5–15.5)
WBC: 8.6 10*3/uL (ref 4.0–10.5)
nRBC: 0 % (ref 0.0–0.2)

## 2020-09-28 LAB — BASIC METABOLIC PANEL
Anion gap: 10 (ref 5–15)
BUN: 16 mg/dL (ref 8–23)
CO2: 31 mmol/L (ref 22–32)
Calcium: 10.1 mg/dL (ref 8.9–10.3)
Chloride: 98 mmol/L (ref 98–111)
Creatinine, Ser: 0.95 mg/dL (ref 0.44–1.00)
GFR, Estimated: 56 mL/min — ABNORMAL LOW (ref >60)
Glucose, Bld: 121 mg/dL — ABNORMAL HIGH (ref 70–99)
Potassium: 3.7 mmol/L (ref 3.5–5.1)
Sodium: 139 mmol/L (ref 135–145)

## 2020-09-28 LAB — RESP PANEL BY RT-PCR (FLU A&B, COVID) ARPGX2
Influenza A by PCR: NEGATIVE
Influenza B by PCR: NEGATIVE
SARS Coronavirus 2 by RT PCR: NEGATIVE

## 2020-09-28 MED ORDER — IOHEXOL 350 MG/ML SOLN
100.0000 mL | Freq: Once | INTRAVENOUS | Status: AC | PRN
  Administered 2020-09-28: 100 mL via INTRAVENOUS

## 2020-09-28 MED ORDER — TETANUS-DIPHTH-ACELL PERTUSSIS 5-2.5-18.5 LF-MCG/0.5 IM SUSY
0.5000 mL | PREFILLED_SYRINGE | Freq: Once | INTRAMUSCULAR | Status: AC
  Administered 2020-09-28: 0.5 mL via INTRAMUSCULAR
  Filled 2020-09-28: qty 0.5

## 2020-09-28 MED ORDER — OXYCODONE-ACETAMINOPHEN 5-325 MG PO TABS
1.0000 | ORAL_TABLET | Freq: Once | ORAL | Status: AC
  Administered 2020-09-28: 1 via ORAL
  Filled 2020-09-28: qty 1

## 2020-09-28 MED ORDER — FENTANYL CITRATE PF 50 MCG/ML IJ SOSY
50.0000 ug | PREFILLED_SYRINGE | Freq: Once | INTRAMUSCULAR | Status: AC
  Administered 2020-09-28: 50 ug via INTRAVENOUS
  Filled 2020-09-28: qty 1

## 2020-09-28 NOTE — Consult Note (Signed)
Patient name: Jamie Brown MRN: [id number] DOB: [birth year] Sex: female  REASON FOR Consult:    Hematoma right arm  HPI:   Jamie Brown is a pleasant 87 y.o. female who resides in a skilled nursing facility.  Apparently she was found this morning down and had fallen out of bed.  She had a significant hematoma in her right arm.  She was seen at [facility].  The patient had a CT scan which showed occlusion of the basilic vein.  He had significant hematoma in her right upper arm.  After discussion with the ER physician there they requested transfer here for evaluation.  The history is obtained from the patient and her daughter.  She falls out of bed's quite a bit and was found down this morning.  She is undergone an extensive work-up at [facility] and has had no other significant injuries.  Her only complaint is right arm pain.  No current facility-administered medications for this encounter.   Current Outpatient Medications  Medication Sig Dispense Refill   acetaminophen (TYLENOL) 325 MG tablet Take 650 mg by mouth every 6 (six) hours as needed for mild pain.     albuterol (VENTOLIN HFA) 108 (90 Base) MCG/ACT inhaler Inhale 1 puff into the lungs 2 (two) times daily.     bisacodyl (DULCOLAX) 10 MG suppository Place 10 mg rectally every 3 (three) days.     Calcium Carbonate-Vitamin D3 (CALCIUM 600-D) 600-400 MG-UNIT TABS Take 1 tablet by mouth daily.     Carboxymethylcellulose Sodium (ARTIFICIAL TEARS OP) Apply 1 drop to eye 3 (three) times daily.     cefUROXime (CEFTIN) 500 MG tablet Take 1 tablet (500 mg total) by mouth 2 (two) times daily with a meal.     cetirizine (ZYRTEC) 10 MG tablet Take 10 mg by mouth daily.     Chlorhexidine Gluconate Cloth 2 % PADS Apply 6 each topically daily at 6 (six) AM.     Cholecalciferol (VITAMIN D3) 30 MCG/15ML LIQD Take by mouth. Vitamin D 3 2000 IU cap ; 2 caps qd     Cranberry 425 MG CAPS Take by mouth daily.     Cyanocobalamin (VITAMIN B 12 PO) Take  1,000 mcg by mouth daily.     docusate sodium (STOOL SOFTENER) 100 MG capsule Take 100 mg by mouth daily as needed for mild constipation.     iron polysaccharides (NIFEREX) 150 MG capsule Take 150 mg by mouth 2 (two) times daily.     lactobacillus acidophilus (BACID) TABS tablet Take 2 tablets by mouth daily.     latanoprost (XALATAN) 0.005 % ophthalmic solution Place 1 drop into both eyes at bedtime.     Melatonin 10 MG TABS Take 10 mg by mouth at bedtime.     mineral oil light external liquid Apply 2 application topically once a week. Two gtts each ear weekly     mupirocin ointment (BACTROBAN) 2 % Place 1 application into the nose 2 (two) times daily. 22 g 0   NIFEdipine (PROCARDIA XL/ADALAT-CC) 60 MG 24 hr tablet Take 60 mg by mouth daily.     nystatin (MYCOSTATIN/NYSTOP) powder Apply 1 g topically 3 (three) times daily.     Omega-3 Fatty Acids (FISH OIL) 1200 MG CAPS Take 1 capsule by mouth daily.     pantoprazole (PROTONIX) 40 MG tablet Take 1 tablet (40 mg total) by mouth 2 (two) times daily before a meal.     polyethylene glycol (MIRALAX / GLYCOLAX) packet Take 17  g by mouth daily as needed. (Patient taking differently: Take 17 g by mouth daily. ) 14 each 0   potassium chloride (K-DUR,KLOR-CON) 10 MEQ tablet Take 10 mEq by mouth daily with breakfast.     sertraline (ZOLOFT) 50 MG tablet Take 50 mg by mouth daily with breakfast.     tiZANidine (ZANAFLEX) 2 MG tablet Take 1 tablet (2 mg total) by mouth every 8 (eight) hours as needed for muscle spasms. 10 tablet 0   torsemide (DEMADEX) 10 MG tablet Take 15 mg by mouth See admin instructions. 1.5 tabs ('15mg'$ ) qd     zolpidem (AMBIEN) 10 MG tablet Take 10 mg by mouth at bedtime.      REVIEW OF SYSTEMS:  '[X]'$  denotes positive finding, '[ ]'$  denotes negative finding Vascular    Leg swelling    Cardiac    Chest pain or chest pressure:    Shortness of breath upon exertion:    Short of breath when lying flat:    Irregular heart rhythm:     Constitutional    Fever or chills:     PHYSICAL EXAM:   Vitals:   09/28/20 0500 09/28/20 0607 09/28/20 0645 09/28/20 0652  BP: 129/70 (!) 141/61 139/63   Pulse: 74 84 87 87  Resp:  '14 19 17  '$ Temp:      TempSrc:      SpO2: 95% 93% 93% 94%  Weight:      Height:        GENERAL: The patient is a well-nourished female, in no acute distress. The vital signs are documented above. CARDIOVASCULAR: There is a regular rate and rhythm. PULMONARY: There is good air exchange bilaterally without wheezing or rales. VASCULAR: She has palpable radial pulses.  She has swelling in both lower extremities.  I cannot palpate pedal pulses however both feet are warm and well-perfused. She has a hematoma in her right upper arm as documented in the photograph below.  She has thin skin.  The skin currently has not compromised.    DATA:   Hemoglobin is 13.8  MEDICAL ISSUES:   HEMATOMA RIGHT UPPER EXTREMITY: This patient has a hematoma in the right upper extremity.  She has an injury to the basilic vein which is not clinically significant.  There is no evidence of active bleeding.  She has very thin skin.  I have recommended daily dressing changes with Xeroform gauze, Kerlix, and a 4 inch Ace for gentle compression.  She may benefit from an arm sling.  The emergency department plans on giving her some pain medicine for the acute phase.  I will arrange follow-up in 2 weeks in my office.  She knows to call sooner if she has problems.  Deitra Mayo [facility] [phone]

## 2020-09-28 NOTE — ED Notes (Signed)
Pt arrived via [facility] from [facility] for consult to vascular surgery. Pt suffered ground level fall this morning. Only injury noted or reported is to pts right arm. Injury is currently covered with compression dressing, but reported continued, significant swelling was concerning. Pt oriented to baseline, GCS 14, A&Ox4 2.

## 2020-09-28 NOTE — ED Provider Notes (Signed)
Patient arrived via transport in stable condition. Hemodynamically stable.  Distal pulses on right arm remain intact.  Dr. Doren Custard was paged and agrees to come see the patient at this time.   Anselmo Pickler, PA-C 09/28/20 [phone]    Fatima Blank, MD 09/28/20 [phone]

## 2020-09-28 NOTE — ED Provider Notes (Signed)
[facility] EMERGENCY DEPT Provider Note   CSN: [id number] Arrival date & time: 09/28/20  0428     History Chief Complaint  Patient presents with   Jamie Brown is a 87 y.o. female.  The history is provided by the patient, the EMS personnel and a relative.  Fall This is a new problem. The current episode started less than 1 hour ago. The problem occurs rarely. The problem has been resolved. Pertinent negatives include no headaches. Nothing aggravates the symptoms. Nothing relieves the symptoms. She has tried nothing for the symptoms. The treatment provided no relief.  Patient presents from [facility] having fallen just prior to arrival.  Patient is not on blood thinners.  Patient hit her RUE on a table and has a blood blister just proximal to the R antecubital fossa.      Past Medical History:  Diagnosis Date   Arthritis    OA   Coronary artery disease    GI bleeding 10/23/2016   High cholesterol    HTN (hypertension) 05/28/2015   Hypertension    Radiation 11/19/14-12/30/14   upper right neck 5000 cGy   Squamous cell carcinoma     Patient Active Problem List   Diagnosis Date Noted   Acute upper GI bleed 10/22/2016   Anemia 10/22/2016   Coronary artery disease 10/22/2016   Dens fracture ([place]) 10/22/2016   Hypokalemia 10/22/2016   Acute GI bleeding 10/22/2016   Sepsis ([place]) 05/28/2015   UTI (lower urinary tract infection) 05/28/2015   HLD (hyperlipidemia) 05/28/2015   HTN (hypertension) 05/28/2015   Lactic acidosis 05/28/2015   Insomnia 05/28/2015   Squamous cell carcinoma of skin of neck 10/29/2014    Past Surgical History:  Procedure Laterality Date   BACK SURGERY     CESAREAN SECTION     ROTATOR CUFF REPAIR Bilateral      OB History   No obstetric history on file.     History reviewed. No pertinent family history.  Social History   Tobacco Use   Smoking status: Never   Smokeless tobacco: Never  Vaping Use   Vaping Use:  Never used  Substance Use Topics   Alcohol use: No   Drug use: No    Home Medications Prior to Admission medications   Medication Sig Start Date End Date Taking? Authorizing Provider  acetaminophen (TYLENOL) 325 MG tablet Take 650 mg by mouth every 6 (six) hours as needed for mild pain.    [provider]  albuterol (VENTOLIN HFA) 108 (90 Base) MCG/ACT inhaler Inhale 1 puff into the lungs 2 (two) times daily.    [provider]  bisacodyl (DULCOLAX) 10 MG suppository Place 10 mg rectally every 3 (three) days.    [provider]  Calcium Carbonate-Vitamin D3 (CALCIUM 600-D) 600-400 MG-UNIT TABS Take 1 tablet by mouth daily.    [provider]  Carboxymethylcellulose Sodium (ARTIFICIAL TEARS OP) Apply 1 drop to eye 3 (three) times daily.    [provider]  cefUROXime (CEFTIN) 500 MG tablet Take 1 tablet (500 mg total) by mouth 2 (two) times daily with a meal. 10/26/16   Geradine Girt, DO  cetirizine (ZYRTEC) 10 MG tablet Take 10 mg by mouth daily.    [provider]  Chlorhexidine Gluconate Cloth 2 % PADS Apply 6 each topically daily at 6 (six) AM. 10/27/16   Geradine Girt, DO  Cholecalciferol (VITAMIN D3) 30 MCG/15ML LIQD Take by mouth. Vitamin D 3  2000 IU cap ; 2 caps qd    [provider]  Cranberry 425 MG CAPS Take by mouth daily.    [provider]  Cyanocobalamin (VITAMIN B 12 PO) Take 1,000 mcg by mouth daily.    [provider]  docusate sodium (STOOL SOFTENER) 100 MG capsule Take 100 mg by mouth daily as needed for mild constipation.    [provider]  iron polysaccharides (NIFEREX) 150 MG capsule Take 150 mg by mouth 2 (two) times daily.    [provider]  lactobacillus acidophilus (BACID) TABS tablet Take 2 tablets by mouth daily.    [provider]  latanoprost (XALATAN) 0.005 % ophthalmic solution Place 1 drop into both eyes at bedtime.    [provider]   Melatonin 10 MG TABS Take 10 mg by mouth at bedtime.    [provider]  mineral oil light external liquid Apply 2 application topically once a week. Two gtts each ear weekly    [provider]  mupirocin ointment (BACTROBAN) 2 % Place 1 application into the nose 2 (two) times daily. 10/26/16   Geradine Girt, DO  NIFEdipine (PROCARDIA XL/ADALAT-CC) 60 MG 24 hr tablet Take 60 mg by mouth daily.    [provider]  nystatin (MYCOSTATIN/NYSTOP) powder Apply 1 g topically 3 (three) times daily.    [provider]  Omega-3 Fatty Acids (FISH OIL) 1200 MG CAPS Take 1 capsule by mouth daily.    [provider]  pantoprazole (PROTONIX) 40 MG tablet Take 1 tablet (40 mg total) by mouth 2 (two) times daily before a meal. 10/26/16   Vann, Jessica U, DO  polyethylene glycol (MIRALAX / GLYCOLAX) packet Take 17 g by mouth daily as needed. Patient taking differently: Take 17 g by mouth daily.  10/26/16   Geradine Girt, DO  potassium chloride (K-DUR,KLOR-CON) 10 MEQ tablet Take 10 mEq by mouth daily with breakfast.    [provider]  sertraline (ZOLOFT) 50 MG tablet Take 50 mg by mouth daily with breakfast.    [provider]  tiZANidine (ZANAFLEX) 2 MG tablet Take 1 tablet (2 mg total) by mouth every 8 (eight) hours as needed for muscle spasms. 10/26/16   Geradine Girt, DO  torsemide (DEMADEX) 10 MG tablet Take 15 mg by mouth See admin instructions. 1.5 tabs ('15mg'$ ) qd    [provider]  zolpidem (AMBIEN) 10 MG tablet Take 10 mg by mouth at bedtime.    [provider]    Allergies    Codeine, Gabapentin, Isoptin [verapamil], Remeron [mirtazapine], Trazodone and nefazodone, and Aldara [imiquimod]  Review of Systems   Review of Systems  Unable to perform ROS: Dementia  Constitutional:  Negative for fever.  HENT:  Negative for facial swelling.   Eyes:  Negative for redness.  Respiratory:  Negative for wheezing.    Cardiovascular:  Negative for leg swelling.  Musculoskeletal:  Positive for joint swelling.  Skin:  Positive for color change.  Neurological:  Negative for headaches.   Physical Exam Updated Vital Signs BP 129/70   Pulse 74   Temp 98.1 F (36.7 C) (Oral)   Resp 16   Ht '5\' 1"'$  (1.549 m)   Wt 78 kg   SpO2 95%   BMI 32.50 kg/m   Physical Exam Vitals and nursing note reviewed.  Constitutional:      General: She is not in acute distress.    Appearance: Normal appearance.  HENT:  Head: Normocephalic and atraumatic.     Nose: Nose normal.     Mouth/Throat:     Mouth: Mucous membranes are moist.     Pharynx: Oropharynx is clear.  Eyes:     Conjunctiva/sclera: Conjunctivae normal.     Pupils: Pupils are equal, round, and reactive to light.  Cardiovascular:     Rate and Rhythm: Normal rate and regular rhythm.     Pulses: Normal pulses.     Heart sounds: Normal heart sounds.  Pulmonary:     Effort: Pulmonary effort is normal.     Breath sounds: Normal breath sounds.  Abdominal:     General: Abdomen is flat. Bowel sounds are normal.     Palpations: Abdomen is soft.     Tenderness: There is no abdominal tenderness. There is no guarding.  Musculoskeletal:        General: Normal range of motion.     Right upper arm: Swelling and tenderness present. No lacerations.     Right wrist: No swelling, deformity or lacerations. Normal range of motion. Normal pulse.     Right hand: There is no disruption of two-point discrimination. Normal capillary refill. Normal pulse.     Cervical back: Normal range of motion and neck supple.     Right lower leg: No edema.     Left lower leg: No edema.  Skin:    General: Skin is warm and dry.     Capillary Refill: Capillary refill takes less than 2 seconds.     Findings: No erythema.       Neurological:     Mental Status: She is alert.     Deep Tendon Reflexes: Reflexes normal.  Psychiatric:        Mood and Affect: Mood normal.         Behavior: Behavior normal.    ED Results / Procedures / Treatments   Labs (all labs ordered are listed, but only abnormal results are displayed) Results for orders placed or performed during the hospital encounter of 09/28/20  CBC with Differential/Platelet  Result Value Ref Range   WBC 8.6 4.0 - 10.5 K/uL   RBC 5.03 3.87 - 5.11 MIL/uL   Hemoglobin 13.8 12.0 - 15.0 g/dL   HCT 42.7 36.0 - 46.0 %   MCV 84.9 80.0 - 100.0 fL   MCH 27.4 26.0 - 34.0 pg   MCHC 32.3 30.0 - 36.0 g/dL   RDW 14.1 11.5 - 15.5 %   Platelets 362 150 - 400 K/uL   nRBC 0.0 0.0 - 0.2 %   Neutrophils Relative % 56 %   Neutro Abs 4.8 1.7 - 7.7 K/uL   Lymphocytes Relative 32 %   Lymphs Abs 2.7 0.7 - 4.0 K/uL   Monocytes Relative 9 %   Monocytes Absolute 0.8 0.1 - 1.0 K/uL   Eosinophils Relative 2 %   Eosinophils Absolute 0.2 0.0 - 0.5 K/uL   Basophils Relative 1 %   Basophils Absolute 0.1 0.0 - 0.1 K/uL   Immature Granulocytes 0 %   Abs Immature Granulocytes 0.02 0.00 - 0.07 K/uL  Basic metabolic panel  Result Value Ref Range   Sodium 139 135 - 145 mmol/L   Potassium 3.7 3.5 - 5.1 mmol/L   Chloride 98 98 - 111 mmol/L   CO2 31 22 - 32 mmol/L   Glucose, Bld 121 (H) 70 - 99 mg/dL   BUN 16 8 - 23 mg/dL   Creatinine, Ser 0.95 0.44 - 1.00 mg/dL  Calcium 10.1 8.9 - 10.3 mg/dL   GFR, Estimated 56 (L) >60 mL/min   Anion gap 10 5 - 15   No results found.   Radiology No results found.  Procedures Procedures   Medications Ordered in ED Medications  iohexol (OMNIPAQUE) 350 MG/ML injection 100 mL (100 mLs Intravenous Contrast Given 09/28/20 0523)  fentaNYL (SUBLIMAZE) injection 50 mcg (50 mcg Intravenous Given 09/28/20 0545)  Tdap (BOOSTRIX) injection 0.5 mL (0.5 mLs Intramuscular Given 09/28/20 0545)    ED Course  I have reviewed the triage vital signs and the nursing notes.  Pertinent labs & imaging results that were available during my care of the patient were reviewed by me and considered in my medical  decision making (see chart for details).  546  Case d/w Dr. Nelia Shi of radiology, loss of the Basilic vein proximal to the antecubital fossa.    Concern for skin necrosis secondary to expanding blood blister from injury to R Basilic vein as the area is expanding during the stay and is tense.  Distal pulses remain intact  550 Case d/w Dr. Scot Dock.  Send ED to ED for vascular surgery to see.  Xeroform and compressive wrap to the RUE  555 case d/w Dr. Dayna Barker who accepts the patient in transfer   Xeroform and compressive wrap placed to the RUE  Final Clinical Impression(s) / ED Diagnoses Final diagnoses:  None   Transfer ED to ED to [facility] to be seen by vascular surgery  Rx / DC Orders ED Discharge Orders     None        Kenitha Glendinning, MD 09/28/20 [phone]

## 2020-09-28 NOTE — Discharge Instructions (Addendum)
The patient has an injured blood vessel on her right arm (basilic vein). It was assessed by the vascular surgeon at our facility and requires care over the next several days. Patient should have arm bandaging changed daily. The bandage should consist of a non-stick dressing (such as telfa) directly over top the wound, followed by a xeroform gauze wrap, and then finally a coban or other compressive bandage over the top. We are providing the patient with a sling for her arm -- she may wish to wear this for some pain control and to help immobilize the arm for the next few days. The sling is for comfort and she can stop using it as tolerated within the next few days. She should follow up with Dr. Scot Dock from vascular surgery in two weeks. Based on her pain level at time of discharge, I anticipate she will only require her at home tramadol along with tylenol, however you can reach out to the vascular surgeon or her primary care doctor if she requires more.

## 2020-09-28 NOTE — ED Provider Notes (Signed)
Jamie Brown Provider Note   CSN: [id number] Arrival date & time: 09/28/20  0428     History Chief Complaint  Patient presents with   Jamie Brown is a 87 y.o. female.   Fall      Past Medical History:  Diagnosis Date   Arthritis    OA   Coronary artery disease    GI bleeding 10/23/2016   High cholesterol    HTN (hypertension) 05/28/2015   Hypertension    Radiation 11/19/14-12/30/14   upper right neck 5000 cGy   Squamous cell carcinoma     Patient Active Problem List   Diagnosis Date Noted   Acute upper GI bleed 10/22/2016   Anemia 10/22/2016   Coronary artery disease 10/22/2016   Dens fracture ([place]) 10/22/2016   Hypokalemia 10/22/2016   Acute GI bleeding 10/22/2016   Sepsis ([place]) 05/28/2015   UTI (lower urinary tract infection) 05/28/2015   HLD (hyperlipidemia) 05/28/2015   HTN (hypertension) 05/28/2015   Lactic acidosis 05/28/2015   Insomnia 05/28/2015   Squamous cell carcinoma of skin of neck 10/29/2014    Past Surgical History:  Procedure Laterality Date   BACK SURGERY     CESAREAN SECTION     ROTATOR CUFF REPAIR Bilateral      OB History   No obstetric history on file.     History reviewed. No pertinent family history.  Social History   Tobacco Use   Smoking status: Never   Smokeless tobacco: Never  Vaping Use   Vaping Use: Never used  Substance Use Topics   Alcohol use: No   Drug use: No    Home Medications Prior to Admission medications   Medication Sig Start Date End Date Taking? Authorizing Provider  acetaminophen (TYLENOL) 500 MG tablet Take 500 mg by mouth 3 (three) times daily.   Yes [provider]  albuterol (VENTOLIN HFA) 108 (90 Base) MCG/ACT inhaler Inhale 2 puffs into the lungs 2 (two) times daily.   Yes [provider]  busPIRone (BUSPAR) 5 MG tablet Take 2.5 mg by mouth 2 (two) times daily.   Yes [provider]  cetirizine (ZYRTEC) 10 MG tablet  Take 10 mg by mouth daily.   Yes [provider]  Cholecalciferol (VITAMIN D3) 50 MCG (2000 UT) capsule Take 4,000 Units by mouth daily.   Yes [provider]  Cranberry 425 MG CAPS Take 425 mg by mouth daily.   Yes [provider]  Cyanocobalamin (VITAMIN B 12 PO) Take 1,000 mcg by mouth daily.   Yes [provider]  iron polysaccharides (NIFEREX) 150 MG capsule Take 150 mg by mouth 2 (two) times daily.   Yes [provider]  lactobacillus acidophilus (BACID) TABS tablet Take 1 tablet by mouth 2 (two) times daily.   Yes [provider]  torsemide (DEMADEX) 20 MG tablet Take 20 mg by mouth daily.   Yes [provider]  traMADol (ULTRAM) 50 MG tablet Take 25 mg by mouth 2 (two) times daily.   Yes [provider]  zolpidem (AMBIEN) 10 MG tablet Take 10 mg by mouth at bedtime.   Yes [provider]  pantoprazole (PROTONIX) 40 MG tablet Take 1 tablet (40 mg total) by mouth 2 (two) times daily before a meal. Patient not taking: Reported on 09/28/2020 10/26/16   Geradine Girt, DO  tiZANidine (ZANAFLEX) 2 MG tablet Take 1 tablet (2 mg total) by mouth every 8 (eight)  hours as needed for muscle spasms. Patient not taking: Reported on 09/28/2020 10/26/16   Geradine Girt, DO    Allergies    Codeine, Gabapentin, Isoptin [verapamil], Remeron [mirtazapine], Trazodone and nefazodone, and Aldara [imiquimod]  Review of Systems   Review of Systems  Physical Exam Updated Vital Signs BP 128/64   Pulse 92   Temp 98.1 F (36.7 C) (Oral)   Resp (!) 28   Ht '5\' 1"'$  (1.549 m)   Wt 78 kg   SpO2 94%   BMI 32.50 kg/m   Physical Exam  ED Results / Procedures / Treatments   Labs (all labs ordered are listed, but only abnormal results are displayed) Labs Reviewed  BASIC METABOLIC PANEL - Abnormal; Notable for the following components:      Result Value   Glucose, Bld 121 (*)    GFR, Estimated 56 (*)    All other components  within normal limits  RESP PANEL BY RT-PCR (FLU A&B, COVID) ARPGX2  CBC WITH DIFFERENTIAL/PLATELET    EKG None  Radiology CT HEAD WO CONTRAST (5MM)  Result Date: 09/28/2020 CLINICAL DATA:  87 year old female status post unwitnessed fall from bed. EXAM: CT HEAD WITHOUT CONTRAST TECHNIQUE: Contiguous axial images were obtained from the base of the skull through the vertex without intravenous contrast. COMPARISON:  Head CT 11/04/2014. FINDINGS: Brain: Generalized cerebral volume loss since 2016. No midline shift, ventriculomegaly, mass effect, evidence of mass lesion, intracranial hemorrhage or evidence of cortically based acute infarction. Patchy and confluent bilateral white matter hypodensity has progressed since 2016, with asymmetric involvement of the deep gray matter nuclei. Stable chronic basal ganglia vascular calcifications. No cortical encephalomalacia identified. Vascular: Extensive Calcified atherosclerosis at the skull base. No suspicious intracranial vascular hyperdensity. Skull: Intact.  Cervical spine is reported separately today. Sinuses/Orbits: Chronic left sphenoid sinus inflammation has decreased since 2016. Other Visualized paranasal sinuses and mastoids are clear. Other: No orbit or scalp soft tissue injury identified. There is some scalp vessel calcified atherosclerosis. IMPRESSION: 1. No acute traumatic injury identified. 2. Progressed cerebral volume loss and small vessel disease since 2016, most commonly due to chronic small vessel disease. 3. Cervical Spine CT reported separately today. Electronically Signed   By: Genevie Ann M.D.   On: 09/28/2020 06:08   CT Cervical Spine Wo Contrast  Result Date: 09/28/2020 CLINICAL DATA:  87 year old female status post unwitnessed fall from bed. History of a type 2 dens fracture in 2018. EXAM: CT CERVICAL SPINE WITHOUT CONTRAST TECHNIQUE: Multidetector CT imaging of the cervical spine was performed without intravenous contrast. Multiplanar CT  image reconstructions were also generated. COMPARISON:  Head CT today.  Cervical spine CT 09/22/2016. FINDINGS: Alignment: Stable since 2018. Chronic straightening of cervical lordosis with chronic mild anterolisthesis at C3-C4 and C4-C5. Chronic interbody ankylosis at the cervicothoracic junction. Stable posterior element alignment. Skull base and vertebrae: Visualized skull base is intact. No atlanto-occipital dissociation. 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 And there is evidence that the odontoid has fused to the C1 ring since 2018. C1-C2 alignment is maintained. C1 remains intact. No new C2 fracture identified. No acute osseous abnormality identified. Diffuse idiopathic skeletal hyperostosis (DISH) detailed below. Soft tissues and spinal canal: No prevertebral fluid or swelling. No visible canal hematoma. Negative noncontrast neck soft tissues aside from bulky chronic cervical carotid calcified atherosclerosis and a partially retropharyngeal course of both carotids. Disc levels: Advanced chronic degeneration about C1 and C2. Chronic ligamentous hypertrophy  about the odontoid. Chronic bulky flowing endplate osteophytes anteriorly from C4 into the upper thoracic spine. Associated chronic C6-C7 and now C7-T1 interbody ankylosis. Advanced upper cervical endplate and facet degeneration, severe on the left at C3-C4 and C4-C5 associated with chronic spondylolisthesis. But mild if any associated cervical spinal stenosis. Upper chest: Partially visible upper thoracic interbody ankylosis at T2-T3. Negative lung apices. IMPRESSION: 1. Type 2 dens fracture from 2018 is ununited, and the odontoid fragment has fused to the C1 ring since that time. But C1-C2 alignment is maintained. And no acute traumatic injury is identified in the cervical spine. 2. Underlying chronic diffuse idiopathic skeletal hyperostosis (DISH). Chronic lower thoracic and cervicothoracic  junction interbody ankylosis. Mild superimposed upper cervical spondylolisthesis. Electronically Signed   By: Genevie Ann M.D.   On: 09/28/2020 06:17   CT ANGIO UP EXTREM RIGHT W &/OR WO CONTRAST  Result Date: 09/28/2020 CLINICAL DATA:  Right arm injury after fall. EXAM: CT ANGIOGRAPHY OF THE RIGHT UPPEREXTREMITY TECHNIQUE: Multidetector CT imaging of the right upperwas performed using the standard protocol during bolus administration of intravenous contrast. Multiplanar CT image reconstructions and MIPs were obtained to evaluate the vascular anatomy. CONTRAST:  128m OMNIPAQUE IOHEXOL 350 MG/ML SOLN COMPARISON:  None. FINDINGS: Vascular Acute disruption of the right basilic vein in the mid upper arm (series 9, image 55) with associated 8.3 x 3.0 x 6.2 cm hematoma containing multiple foci of contrast pooling. There is reconstitution of the distal basilic vein (series 9, image 63). Total length of basilic vein injury measures approximately 6.6 cm. Nonocclusive filling defect in the distal basilic vein consistent with thrombus (series 7, image 152; series 9, image 64). The right subclavian, axillary, brachial, radial, and ulnar arteries are patent without evidence of injury. Bones/Joint/Cartilage No fracture or dislocation. Degenerative changes of the right shoulder. No joint effusion. Ligaments Ligaments are suboptimally evaluated by CT. Muscles and Tendons Grossly intact. Soft tissue No soft tissue mass. Other Tiny gallstones.  Right renal cysts. Review of the MIP images confirms the above findings. IMPRESSION: 1. Acute disruption of the right basilic vein in the mid upper arm over a length of approximately 6.6 cm. Associated 8.3 x 3.0 x 6.2 cm hematoma with active contrast extravasation. 2. Nonocclusive thrombus in the distal basilic vein. 3. No acute osseous abnormality. These results were called by telephone on 09/28/2020 at 5:41 am [facility] verbally acknowledged these results. Electronically  Signed   By: WTitus DubinM.D.   On: 09/28/2020 06:13    Procedures Procedures   Medications Ordered in ED Medications  oxyCODONE-acetaminophen (PERCOCET/ROXICET) 5-325 MG per tablet 1 tablet (has no administration in time range)  iohexol (OMNIPAQUE) 350 MG/ML injection 100 mL (100 mLs Intravenous Contrast Given 09/28/20 0523)  fentaNYL (SUBLIMAZE) injection 50 mcg (50 mcg Intravenous Given 09/28/20 0545)  Tdap (BOOSTRIX) injection 0.5 mL (0.5 mLs Intramuscular Given 09/28/20 0545)    ED Course  I have reviewed the triage vital signs and the nursing notes.  Pertinent labs & imaging results that were available during my care of the patient were reviewed by me and considered in my medical decision making (see chart for details).  Clinical Course as of 09/28/20 0805  Tue Sep 28, 2020  0705 [facility] pt, basilic vein eNetta Corrigan pending vascular recs [MK]    Clinical Course User Index [MK] KTeressa Lower MD   MDM Rules/Calculators/A&P  Patient accepted as ED to ED transfer. She was assessed by vascular surgery who does not favor a need for surgery at this time. Recommendations for patient include xeroform gauze and compression wrapping, sling of arm, and re-evaluation by vascular surgery in two weeks. Patient is hemodynamically stable with no current expansion of hematoma at this time. Patient not in significant pain at time of discharge.    Final Clinical Impression(s) / ED Diagnoses Final diagnoses:  Encounter for imaging of right basilic vein  Fall, initial encounter  Blood blister    Rx / DC Orders ED Discharge Orders     None        Dorien Chihuahua 09/28/20 0805    Teressa Lower, MD 09/28/20 1556

## 2020-09-28 NOTE — ED Triage Notes (Signed)
Pt via ems from [facility] after an unwitnessed fall from her bed. Pt denies loc or hitting her head; states her only injury is to her right arm. Pt alert & oriented, nad noted.

## 2020-09-30 ENCOUNTER — Telehealth: Payer: Self-pay

## 2020-09-30 NOTE — Telephone Encounter (Signed)
Patient presented to the ED with hematoma and was seen by [facility]. RN calls today to ask if they can apply tubi-grip instead of the ace and come out 3x per week instead of once a day. Advised this was fine.

## 2020-10-14 ENCOUNTER — Other Ambulatory Visit: Payer: Self-pay

## 2020-10-14 ENCOUNTER — Ambulatory Visit (INDEPENDENT_AMBULATORY_CARE_PROVIDER_SITE_OTHER): Payer: Medicare Other | Admitting: Vascular Surgery

## 2020-10-14 ENCOUNTER — Encounter: Payer: Self-pay | Admitting: Vascular Surgery

## 2020-10-14 VITALS — BP 111/70 | HR 71 | Temp 97.8°F | Resp 20 | Ht 61.0 in | Wt 172.0 lb

## 2020-10-14 DIAGNOSIS — S40021D Contusion of right upper arm, subsequent encounter: Secondary | ICD-10-CM

## 2020-10-14 NOTE — Progress Notes (Signed)
Patient name: Jamie Brown MRN: [id number] DOB: 20-May-1924 Sex: female  REASON FOR VISIT:   Follow-up of hematoma right arm.  HPI:   Jamie Brown is a pleasant 87 y.o. female who I saw in consultation on 09/28/2020 with a hematoma of her right arm.  She resides in a skilled nursing facility.  Apparently she fell and developed a significant hematoma in her right arm.  She was seen at [facility] where a CT scan showed an occlusion of the basilic vein.  They requested transfer to [facility].  On my exam she had a hematoma in her right upper arm as documented on the photograph from that day.  Her skin was fairly thin.  The skin was not compromised.  I recommended daily dressing changes with Xeroform Kerlix and a 4 inch Ace for gentle compression.  She comes in for 2-week follow-up visit.  Today, the patient has no specific complaints.  Home health has been doing the daily dressing change.  Current Outpatient Medications  Medication Sig Dispense Refill   acetaminophen (TYLENOL) 500 MG tablet Take 500 mg by mouth 3 (three) times daily.     albuterol (VENTOLIN HFA) 108 (90 Base) MCG/ACT inhaler Inhale 2 puffs into the lungs 2 (two) times daily.     busPIRone (BUSPAR) 5 MG tablet Take 2.5 mg by mouth 2 (two) times daily.     cetirizine (ZYRTEC) 10 MG tablet Take 10 mg by mouth daily.     Cholecalciferol (VITAMIN D3) 50 MCG (2000 UT) capsule Take 4,000 Units by mouth daily.     Cranberry 425 MG CAPS Take 425 mg by mouth daily.     Cyanocobalamin (VITAMIN B 12 PO) Take 1,000 mcg by mouth daily.     iron polysaccharides (NIFEREX) 150 MG capsule Take 150 mg by mouth 2 (two) times daily.     lactobacillus acidophilus (BACID) TABS tablet Take 1 tablet by mouth 2 (two) times daily.     torsemide (DEMADEX) 20 MG tablet Take 20 mg by mouth daily.     traMADol (ULTRAM) 50 MG tablet Take 25 mg by mouth 2 (two) times daily.     zolpidem (AMBIEN) 10 MG tablet Take 10 mg by mouth at bedtime.     No current  facility-administered medications for this visit.    REVIEW OF SYSTEMS:  [X]  denotes positive finding, [ ]  denotes negative finding Vascular    Leg swelling    Cardiac    Chest pain or chest pressure:    Shortness of breath upon exertion:    Short of breath when lying flat:    Irregular heart rhythm:    Constitutional    Fever or chills:     PHYSICAL EXAM:   Vitals:   10/14/20 1341  BP: 111/70  Pulse: 71  Resp: 20  Temp: 97.8 F (36.6 C)  SpO2: 92%  Weight: 172 lb (78 kg)  Height: 5\' 1"  (1.549 m)    GENERAL: The patient is a well-nourished female, in no acute distress. The vital signs are documented above. . The hematoma in the right upper arm is stable.  The skin is not compromised.  DATA:   No new data  MEDICAL ISSUES:   HEMATOMA RIGHT UPPER EXTREMITY: The skin in the upper arm is healing well.  I think will take 6 to 8 weeks for the hematoma to resolve.  I have encouraged the nurses to elevate the arm on 2 pillows daily.  I do not think any  more dressing changes are needed.  I will see her back as needed.  Deitra Mayo [facility] [phone]

## 2020-11-15 ENCOUNTER — Encounter (HOSPITAL_BASED_OUTPATIENT_CLINIC_OR_DEPARTMENT_OTHER): Payer: Self-pay

## 2020-11-15 ENCOUNTER — Inpatient Hospital Stay (HOSPITAL_BASED_OUTPATIENT_CLINIC_OR_DEPARTMENT_OTHER)
Admission: EM | Admit: 2020-11-15 | Discharge: 2020-11-23 | DRG: 871 | Disposition: E | Payer: Medicare Other | Source: Skilled Nursing Facility | Attending: Internal Medicine | Admitting: Internal Medicine

## 2020-11-15 ENCOUNTER — Other Ambulatory Visit: Payer: Self-pay

## 2020-11-15 ENCOUNTER — Emergency Department (HOSPITAL_BASED_OUTPATIENT_CLINIC_OR_DEPARTMENT_OTHER): Payer: Medicare Other | Admitting: Radiology

## 2020-11-15 DIAGNOSIS — Z6830 Body mass index (BMI) 30.0-30.9, adult: Secondary | ICD-10-CM

## 2020-11-15 DIAGNOSIS — Z9861 Coronary angioplasty status: Secondary | ICD-10-CM | POA: Diagnosis not present

## 2020-11-15 DIAGNOSIS — E7800 Pure hypercholesterolemia, unspecified: Secondary | ICD-10-CM | POA: Diagnosis present

## 2020-11-15 DIAGNOSIS — I110 Hypertensive heart disease with heart failure: Secondary | ICD-10-CM | POA: Diagnosis present

## 2020-11-15 DIAGNOSIS — I4891 Unspecified atrial fibrillation: Secondary | ICD-10-CM | POA: Diagnosis present

## 2020-11-15 DIAGNOSIS — N179 Acute kidney failure, unspecified: Secondary | ICD-10-CM | POA: Diagnosis not present

## 2020-11-15 DIAGNOSIS — I2510 Atherosclerotic heart disease of native coronary artery without angina pectoris: Secondary | ICD-10-CM | POA: Diagnosis present

## 2020-11-15 DIAGNOSIS — Z79899 Other long term (current) drug therapy: Secondary | ICD-10-CM

## 2020-11-15 DIAGNOSIS — R7989 Other specified abnormal findings of blood chemistry: Secondary | ICD-10-CM | POA: Diagnosis present

## 2020-11-15 DIAGNOSIS — R531 Weakness: Secondary | ICD-10-CM

## 2020-11-15 DIAGNOSIS — I350 Nonrheumatic aortic (valve) stenosis: Secondary | ICD-10-CM | POA: Diagnosis not present

## 2020-11-15 DIAGNOSIS — Z20822 Contact with and (suspected) exposure to covid-19: Secondary | ICD-10-CM | POA: Diagnosis not present

## 2020-11-15 DIAGNOSIS — H9190 Unspecified hearing loss, unspecified ear: Secondary | ICD-10-CM | POA: Diagnosis present

## 2020-11-15 DIAGNOSIS — Z885 Allergy status to narcotic agent status: Secondary | ICD-10-CM

## 2020-11-15 DIAGNOSIS — F0394 Unspecified dementia, unspecified severity, with anxiety: Secondary | ICD-10-CM | POA: Diagnosis present

## 2020-11-15 DIAGNOSIS — I5031 Acute diastolic (congestive) heart failure: Secondary | ICD-10-CM

## 2020-11-15 DIAGNOSIS — R778 Other specified abnormalities of plasma proteins: Secondary | ICD-10-CM

## 2020-11-15 DIAGNOSIS — R269 Unspecified abnormalities of gait and mobility: Secondary | ICD-10-CM | POA: Diagnosis present

## 2020-11-15 DIAGNOSIS — J9601 Acute respiratory failure with hypoxia: Secondary | ICD-10-CM | POA: Diagnosis not present

## 2020-11-15 DIAGNOSIS — Z515 Encounter for palliative care: Secondary | ICD-10-CM | POA: Diagnosis not present

## 2020-11-15 DIAGNOSIS — R000 Tachycardia, unspecified: Secondary | ICD-10-CM | POA: Diagnosis present

## 2020-11-15 DIAGNOSIS — I452 Bifascicular block: Secondary | ICD-10-CM | POA: Diagnosis present

## 2020-11-15 DIAGNOSIS — E872 Acidosis, unspecified: Secondary | ICD-10-CM | POA: Diagnosis present

## 2020-11-15 DIAGNOSIS — R6520 Severe sepsis without septic shock: Secondary | ICD-10-CM

## 2020-11-15 DIAGNOSIS — Z66 Do not resuscitate: Secondary | ICD-10-CM | POA: Diagnosis present

## 2020-11-15 DIAGNOSIS — E669 Obesity, unspecified: Secondary | ICD-10-CM | POA: Diagnosis present

## 2020-11-15 DIAGNOSIS — I080 Rheumatic disorders of both mitral and aortic valves: Secondary | ICD-10-CM | POA: Diagnosis present

## 2020-11-15 DIAGNOSIS — I342 Nonrheumatic mitral (valve) stenosis: Secondary | ICD-10-CM | POA: Diagnosis not present

## 2020-11-15 DIAGNOSIS — E8770 Fluid overload, unspecified: Secondary | ICD-10-CM | POA: Diagnosis present

## 2020-11-15 DIAGNOSIS — M1990 Unspecified osteoarthritis, unspecified site: Secondary | ICD-10-CM | POA: Diagnosis present

## 2020-11-15 DIAGNOSIS — I509 Heart failure, unspecified: Secondary | ICD-10-CM | POA: Diagnosis not present

## 2020-11-15 DIAGNOSIS — R0603 Acute respiratory distress: Secondary | ICD-10-CM

## 2020-11-15 DIAGNOSIS — R451 Restlessness and agitation: Secondary | ICD-10-CM | POA: Diagnosis not present

## 2020-11-15 DIAGNOSIS — J309 Allergic rhinitis, unspecified: Secondary | ICD-10-CM | POA: Diagnosis present

## 2020-11-15 DIAGNOSIS — A419 Sepsis, unspecified organism: Principal | ICD-10-CM

## 2020-11-15 DIAGNOSIS — I10 Essential (primary) hypertension: Secondary | ICD-10-CM | POA: Diagnosis present

## 2020-11-15 DIAGNOSIS — Z7189 Other specified counseling: Secondary | ICD-10-CM | POA: Diagnosis not present

## 2020-11-15 DIAGNOSIS — Z888 Allergy status to other drugs, medicaments and biological substances status: Secondary | ICD-10-CM

## 2020-11-15 DIAGNOSIS — I248 Other forms of acute ischemic heart disease: Secondary | ICD-10-CM | POA: Diagnosis not present

## 2020-11-15 DIAGNOSIS — J189 Pneumonia, unspecified organism: Secondary | ICD-10-CM

## 2020-11-15 DIAGNOSIS — I351 Nonrheumatic aortic (valve) insufficiency: Secondary | ICD-10-CM | POA: Diagnosis not present

## 2020-11-15 LAB — URINALYSIS, COMPLETE (UACMP) WITH MICROSCOPIC
Bilirubin Urine: NEGATIVE
Glucose, UA: NEGATIVE mg/dL
Ketones, ur: NEGATIVE mg/dL
Nitrite: NEGATIVE
Protein, ur: NEGATIVE mg/dL
Specific Gravity, Urine: 1.011 (ref 1.005–1.030)
pH: 5 (ref 5.0–8.0)

## 2020-11-15 LAB — CBC
HCT: 38.9 % (ref 36.0–46.0)
Hemoglobin: 12.6 g/dL (ref 12.0–15.0)
MCH: 27.8 pg (ref 26.0–34.0)
MCHC: 32.4 g/dL (ref 30.0–36.0)
MCV: 85.9 fL (ref 80.0–100.0)
Platelets: 333 10*3/uL (ref 150–400)
RBC: 4.53 MIL/uL (ref 3.87–5.11)
RDW: 14.6 % (ref 11.5–15.5)
WBC: 18 10*3/uL — ABNORMAL HIGH (ref 4.0–10.5)
nRBC: 0 % (ref 0.0–0.2)

## 2020-11-15 LAB — CREATININE, URINE, RANDOM: Creatinine, Urine: 60.58 mg/dL

## 2020-11-15 LAB — BASIC METABOLIC PANEL
Anion gap: 11 (ref 5–15)
BUN: 28 mg/dL — ABNORMAL HIGH (ref 8–23)
CO2: 29 mmol/L (ref 22–32)
Calcium: 10.1 mg/dL (ref 8.9–10.3)
Chloride: 96 mmol/L — ABNORMAL LOW (ref 98–111)
Creatinine, Ser: 1.38 mg/dL — ABNORMAL HIGH (ref 0.44–1.00)
GFR, Estimated: 35 mL/min — ABNORMAL LOW (ref >60)
Glucose, Bld: 149 mg/dL — ABNORMAL HIGH (ref 70–99)
Potassium: 4.5 mmol/L (ref 3.5–5.1)
Sodium: 136 mmol/L (ref 135–145)

## 2020-11-15 LAB — RESP PANEL BY RT-PCR (FLU A&B, COVID) ARPGX2
Influenza A by PCR: NEGATIVE
Influenza B by PCR: NEGATIVE
SARS Coronavirus 2 by RT PCR: NEGATIVE

## 2020-11-15 LAB — LACTIC ACID, PLASMA
Lactic Acid, Venous: 2 mmol/L (ref 0.5–1.9)
Lactic Acid, Venous: 1.6 mmol/L (ref 0.5–1.9)

## 2020-11-15 LAB — BRAIN NATRIURETIC PEPTIDE: B Natriuretic Peptide: 332.1 pg/mL — ABNORMAL HIGH (ref 0.0–100.0)

## 2020-11-15 LAB — MAGNESIUM: Magnesium: 2 mg/dL (ref 1.7–2.4)

## 2020-11-15 LAB — TSH: TSH: 1.158 u[IU]/mL (ref 0.350–4.500)

## 2020-11-15 LAB — TROPONIN I (HIGH SENSITIVITY): Troponin I (High Sensitivity): 109 ng/L (ref <18)

## 2020-11-15 LAB — SODIUM, URINE, RANDOM: Sodium, Ur: 24 mmol/L

## 2020-11-15 LAB — PROCALCITONIN: Procalcitonin: 8.15 ng/mL

## 2020-11-15 LAB — MRSA NEXT GEN BY PCR, NASAL: MRSA by PCR Next Gen: NOT DETECTED

## 2020-11-15 MED ORDER — SODIUM CHLORIDE 0.9 % IV SOLN
100.0000 mg | Freq: Two times a day (BID) | INTRAVENOUS | Status: DC
  Administered 2020-11-16: 100 mg via INTRAVENOUS
  Filled 2020-11-15: qty 100

## 2020-11-15 MED ORDER — LACTATED RINGERS IV SOLN: INTRAVENOUS | Status: AC

## 2020-11-15 MED ORDER — DOXYCYCLINE HYCLATE 100 MG PO TABS
100.0000 mg | ORAL_TABLET | Freq: Once | ORAL | Status: AC
  Administered 2020-11-15: 100 mg via ORAL
  Filled 2020-11-15: qty 1

## 2020-11-15 MED ORDER — BUSPIRONE HCL 5 MG PO TABS
2.5000 mg | ORAL_TABLET | Freq: Two times a day (BID) | ORAL | Status: DC
  Administered 2020-11-16 – 2020-11-18 (×5): 2.5 mg via ORAL
  Filled 2020-11-15 (×6): qty 1

## 2020-11-15 MED ORDER — ZOLPIDEM TARTRATE 5 MG PO TABS
5.0000 mg | ORAL_TABLET | Freq: Every evening | ORAL | Status: DC | PRN
  Administered 2020-11-15 – 2020-11-18 (×4): 5 mg via ORAL
  Filled 2020-11-15 (×4): qty 1

## 2020-11-15 MED ORDER — FUROSEMIDE 10 MG/ML IJ SOLN
40.0000 mg | Freq: Once | INTRAMUSCULAR | Status: AC
  Administered 2020-11-16: 40 mg via INTRAVENOUS
  Filled 2020-11-15: qty 4

## 2020-11-15 MED ORDER — SODIUM CHLORIDE 0.9 % IV SOLN
1.0000 g | Freq: Once | INTRAVENOUS | Status: AC
  Administered 2020-11-15: 1 g via INTRAVENOUS
  Filled 2020-11-15: qty 10

## 2020-11-15 MED ORDER — ACETAMINOPHEN 325 MG PO TABS
650.0000 mg | ORAL_TABLET | Freq: Four times a day (QID) | ORAL | Status: DC | PRN
  Administered 2020-11-16: 650 mg via ORAL
  Filled 2020-11-15: qty 2

## 2020-11-15 MED ORDER — ACETAMINOPHEN 650 MG RE SUPP: 650.0000 mg | Freq: Four times a day (QID) | RECTAL | Status: DC | PRN

## 2020-11-15 MED ORDER — IPRATROPIUM-ALBUTEROL 0.5-2.5 (3) MG/3ML IN SOLN: 3.0000 mL | RESPIRATORY_TRACT | Status: DC | PRN

## 2020-11-15 MED ORDER — SODIUM CHLORIDE 0.9 % IV SOLN
1.0000 g | INTRAVENOUS | Status: DC
  Administered 2020-11-16: 1 g via INTRAVENOUS
  Filled 2020-11-15 (×3): qty 10

## 2020-11-15 MED ORDER — IPRATROPIUM-ALBUTEROL 0.5-2.5 (3) MG/3ML IN SOLN
3.0000 mL | RESPIRATORY_TRACT | Status: AC | PRN
  Administered 2020-11-15: 3 mL via RESPIRATORY_TRACT
  Filled 2020-11-15: qty 3

## 2020-11-15 MED ORDER — LORATADINE 10 MG PO TABS
10.0000 mg | ORAL_TABLET | Freq: Every day | ORAL | Status: DC
  Administered 2020-11-16 – 2020-11-18 (×2): 10 mg via ORAL
  Filled 2020-11-15 (×3): qty 1

## 2020-11-15 MED ORDER — MELATONIN 3 MG PO TABS: 3.0000 mg | ORAL_TABLET | Freq: Every evening | ORAL | Status: DC | PRN

## 2020-11-15 MED ORDER — ALBUTEROL SULFATE (2.5 MG/3ML) 0.083% IN NEBU
2.5000 mg | INHALATION_SOLUTION | RESPIRATORY_TRACT | Status: DC | PRN
  Administered 2020-11-16 – 2020-11-18 (×5): 2.5 mg via RESPIRATORY_TRACT
  Filled 2020-11-15 (×5): qty 3

## 2020-11-15 NOTE — Progress Notes (Signed)
Hospitalist accepting note from [facility]:  87 year old female comes from home with shortness of breath and found to have acute hypoxia which appears to be both pneumonia and CHF  (CHF is new finding).  Patient received IV antibiotics and Lasix in the emergency room.  Comfortable on 2 L nasal cannula (baseline is not on any oxygen.)  Patient is a DNR.  Mildly elevated troponin likely more from CHF.  Does not meet criteria for sepsis.  Lactic acid and procalcitonin levels pending.

## 2020-11-15 NOTE — ED Notes (Signed)
Report given to [facility], currently in route to pick up pt for transfer.

## 2020-11-15 NOTE — ED Triage Notes (Signed)
Pt brought in by [facility] for shortness of breath from assisted living onset Saturday.  BLE weeping edema noted. Pt on O2 2-4 L/min [place] in triage, new for pt. 88% on RA.

## 2020-11-15 NOTE — ED Notes (Signed)
IV access attempted x 1 in triage; unsuccessful.7

## 2020-11-15 NOTE — ED Notes (Signed)
[facility] at bed side.  Handoff report given to Katie RN at 4E at [facility]

## 2020-11-15 NOTE — ED Provider Notes (Signed)
[facility] EMERGENCY DEPT Provider Note   CSN: [id number] Arrival date & time: 10/25/2020  1312     History Chief Complaint  Patient presents with   Shortness of Breath    Jamie Brown is a 87 y.o. female presenting from nursing facility with concern for shortness of breath.  Supplemental history provided by patient's daughter at bedside, as the patient does have a history of dementia poor memory issues.  Her daughter reports the patient has been feeling short of breath and had a mild  cough since Sat 10/22.  Patient reports that she feels very fatigued and has poor appetite.  She is normally not on oxygen.  Her daughter denies that the patient is any history of pulmonary embolism or congestive heart failure.  She also denies the patient is any history of COPD or lung problems.  The patient does arrive with a DNR form  HPI     Past Medical History:  Diagnosis Date   Arthritis    OA   Coronary artery disease    GI bleeding 10/23/2016   High cholesterol    HTN (hypertension) 05/28/2015   Hypertension    Radiation 11/19/14-12/30/14   upper right neck 5000 cGy   Squamous cell carcinoma     Patient Active Problem List   Diagnosis Date Noted   Acute respiratory failure with hypoxia ([place]) 10/25/2020   Acute CHF (congestive heart failure) ([place]) 11/14/2020   CAP (community acquired pneumonia) 10/24/2020   Obesity (BMI 30-39.9) 11/07/2020   Acute upper GI bleed 10/22/2016   Anemia 10/22/2016   Coronary artery disease 10/22/2016   Dens fracture ([place]) 10/22/2016   Hypokalemia 10/22/2016   Acute GI bleeding 10/22/2016   Sepsis ([place]) 05/28/2015   UTI (lower urinary tract infection) 05/28/2015   HLD (hyperlipidemia) 05/28/2015   HTN (hypertension) 05/28/2015   Lactic acidosis 05/28/2015   Insomnia 05/28/2015   Squamous cell carcinoma of skin of neck 10/29/2014    Past Surgical History:  Procedure Laterality Date   BACK SURGERY     CESAREAN SECTION      ROTATOR CUFF REPAIR Bilateral      OB History   No obstetric history on file.     History reviewed. No pertinent family history.  Social History   Tobacco Use   Smoking status: Never    Passive exposure: Past   Smokeless tobacco: Never  Vaping Use   Vaping Use: Never used  Substance Use Topics   Alcohol use: No   Drug use: No    Home Medications Prior to Admission medications   Medication Sig Start Date End Date Taking? Authorizing Provider  acetaminophen (TYLENOL) 500 MG tablet Take 500 mg by mouth 3 (three) times daily.    [provider]  albuterol (VENTOLIN HFA) 108 (90 Base) MCG/ACT inhaler Inhale 2 puffs into the lungs 2 (two) times daily.    [provider]  busPIRone (BUSPAR) 5 MG tablet Take 2.5 mg by mouth 2 (two) times daily.    [provider]  cetirizine (ZYRTEC) 10 MG tablet Take 10 mg by mouth daily.    [provider]  Cholecalciferol (VITAMIN D3) 50 MCG (2000 UT) capsule Take 4,000 Units by mouth daily.    [provider]  Cranberry 425 MG CAPS Take 425 mg by mouth daily.    [provider]  Cyanocobalamin (VITAMIN B 12 PO) Take 1,000 mcg by mouth daily.    [provider]  iron polysaccharides (NIFEREX) 150 MG  capsule Take 150 mg by mouth 2 (two) times daily.    [provider]  lactobacillus acidophilus (BACID) TABS tablet Take 1 tablet by mouth 2 (two) times daily.    [provider]  torsemide (DEMADEX) 20 MG tablet Take 20 mg by mouth daily.    [provider]  traMADol (ULTRAM) 50 MG tablet Take 25 mg by mouth 2 (two) times daily.    [provider]  zolpidem (AMBIEN) 10 MG tablet Take 10 mg by mouth at bedtime.    [provider]    Allergies    Codeine, Gabapentin, Isoptin [verapamil], Remeron [mirtazapine], Trazodone and nefazodone, and Aldara [imiquimod]  Review of Systems   Review of Systems  Constitutional:  Positive for fatigue and  fever. Negative for chills.  Eyes:  Negative for pain and visual disturbance.  Respiratory:  Positive for cough and shortness of breath.   Cardiovascular:  Negative for chest pain and palpitations.  Gastrointestinal:  Negative for abdominal pain and vomiting.  Genitourinary:  Negative for dysuria and hematuria.  Musculoskeletal:  Negative for arthralgias and back pain.  Skin:  Negative for color change and rash.  Neurological:  Negative for syncope and light-headedness.  All other systems reviewed and are negative.  Physical Exam Updated Vital Signs BP (!) 123/56 (BP Location: Right Arm)   Pulse 82   Temp 99.2 F (37.3 C) (Oral)   Resp (!) 23   Ht 5' 1.5" (1.562 m)   Wt 73.9 kg   SpO2 96%   BMI 30.30 kg/m   Physical Exam Constitutional:      General: She is not in acute distress. HENT:     Head: Normocephalic and atraumatic.  Eyes:     Conjunctiva/sclera: Conjunctivae normal.     Pupils: Pupils are equal, round, and reactive to light.  Cardiovascular:     Rate and Rhythm: Normal rate and regular rhythm.  Pulmonary:     Effort: Pulmonary effort is normal. No respiratory distress.     Comments: 93% on 2L [place] Abdominal:     General: There is no distension.     Tenderness: There is no abdominal tenderness.  Musculoskeletal:     Right lower leg: Edema present.     Left lower leg: Edema present.  Skin:    General: Skin is warm and dry.  Neurological:     General: No focal deficit present.     Mental Status: She is alert and oriented to person, place, and time. Mental status is at baseline.  Psychiatric:        Mood and Affect: Mood normal.        Behavior: Behavior normal.    ED Results / Procedures / Treatments   Labs (all labs ordered are listed, but only abnormal results are displayed) Labs Reviewed  BASIC METABOLIC PANEL - Abnormal; Notable for the following components:      Result Value   Chloride 96 (*)    Glucose, Bld 149 (*)    BUN 28 (*)    Creatinine,  Ser 1.38 (*)    GFR, Estimated 35 (*)    All other components within normal limits  CBC - Abnormal; Notable for the following components:   WBC 18.0 (*)    All other components within normal limits  BRAIN NATRIURETIC PEPTIDE - Abnormal; Notable for the following components:   B Natriuretic Peptide 332.1 (*)    All other components within normal limits  LACTIC ACID, PLASMA - Abnormal; Notable  for the following components:   Lactic Acid, Venous 2.0 (*)    All other components within normal limits  TROPONIN I (HIGH SENSITIVITY) - Abnormal; Notable for the following components:   Troponin I (High Sensitivity) 109 (*)    All other components within normal limits  RESP PANEL BY RT-PCR (FLU A&B, COVID) [id number]  PROCALCITONIN  TROPONIN I (HIGH SENSITIVITY)    EKG EKG Interpretation  Date/Time:  Monday November 15 2020 15:06:45 EDT Ventricular Rate:  81 PR Interval:  192 QRS Duration: 146 QT Interval:  412 QTC Calculation: 479 R Axis:   90 Text Interpretation: Sinus rhythm  RBBB new from Aug 2018 Confirmed by Octaviano Glow [id number]) on 10/25/2020 3:18:14 PM  Radiology DG Chest 2 View  Result Date: 11/05/2020 CLINICAL DATA:  Shortness of breath EXAM: CHEST - 2 VIEW COMPARISON:  05/28/2015 FINDINGS: Heart size is normal. Chronic aortic atherosclerosis. Patchy bilateral lower lobe pneumonia, left worse than right. No dense consolidation or lobar collapse. No visible effusion. No acute bone finding. IMPRESSION: Patchy infiltrates in both lower lobes, left more than right, consistent with bronchopneumonia. Electronically Signed   By: Nelson Chimes M.D.   On: 11/01/2020 15:20    Procedures Procedures   Medications Ordered in ED Medications  doxycycline (VIBRA-TABS) tablet 100 mg (100 mg Oral Given 11/18/2020 1608)  cefTRIAXone (ROCEPHIN) 1 g in sodium chloride 0.9 % 100 mL IVPB (0 g Intravenous Stopped 10/26/2020 1648)    ED Course  I have reviewed the triage vital signs and the nursing  notes.  Pertinent labs & imaging results that were available during my care of the patient were reviewed by me and considered in my medical decision making (see chart for details).  Patient is here with malaise and fatigue for 2 days.  Differential includes infection including pneumonia versus COVID versus anemia versus arrhythmia versus new onset congestive heart failure versus other.  She does have significant pitting edema lower extremities, this appears to be chronic but worse per her daughter's report.  She has new oxygen requirement here of 2 L requiring nasal cannula.  I personally reviewed interpreted the patient's EKG, blood test, chest x-ray.  These were significant for leukocytosis and white blood cell count of 18,000.  Her chest x-ray is concerning for bronchopneumonia.  Her EKG shows a sinus rhythm with bifascicular block, the right bundle branch pattern does appear to be new from prior.  However she is chest pain-free at this time.  We will add on a troponin level, but have a lower suspicion for ACS.  This appears more clinically consistent with CAP.  I have ordered Rocephin and doxycycline.  Supplemental history provided by the patient's daughter at bedside.  Her COVID test is negative. Temp 99.30F at arrival   Clinical Course as of 10/31/2020 1823  Mon Nov 15, 2020  1615 Will page hospitalist for admission [MT]  1702 Trop and BNP mildly elevated; patient is chest pain free, may have some demand ischemia/mild CHF component, will need repeat troponin [MT]  1715 Admitted to hospitalist for CAP and likely new onset CHF [MT]    Clinical Course User Index [MT] Mara Favero, Carola Rhine, MD    Final Clinical Impression(s) / ED Diagnoses Final diagnoses:  Community acquired pneumonia, unspecified laterality  Elevated troponin    Rx / DC Orders ED Discharge Orders     None        Wyvonnia Dusky, MD 11/18/2020 [phone]

## 2020-11-15 NOTE — ED Triage Notes (Signed)
Has DNR/MOST Form

## 2020-11-15 NOTE — ED Notes (Signed)
RT Note: Pt. seen in lobby while being checked in who was brought to [facility] by [facility] for shortness of breath, continued/placed on 2 lpm n/c upon arrival, was 94% while on 2 lpm n/c with pulse of 90, b/l b.s. diminished/clear.

## 2020-11-15 NOTE — H&P (Signed)
History and Physical    PLEASE NOTE THAT DRAGON DICTATION SOFTWARE WAS USED IN THE CONSTRUCTION OF THIS NOTE.   REHAM SLABAUGH TFT:[id number] DOB: September 19, 1924 DOA: 11/14/2020  PCP: System, Provider Not In Patient coming from: home   I have personally briefly reviewed patient's old medical records in [place]  Chief Complaint: Shortness of breath  HPI: Jamie Brown is a 87 y.o. female with medical history significant for dementia, coronary disease status post angioplasty approximately 30 years ago, generalized anxiety disorder, allergic rhinitis, who is admitted to [facility] on 11/16/2020 by way of transfer from [facility] emergency department with suspected severe sepsis due to community-acquired pneumonia with potential additional element of acutely decompensated heart failure with preserved EF after presenting from home to [facility] ED complaining of shortness of breath.   The following history is provided via my discussions with the patient as well as the patient's son-in-law who is present at bedside, in addition to chart review.  The patient has been experiencing 2 to 3 days of progressive shortness of breath associated with new onset nonproductive cough.  There has been some associated swelling in the lower extremities over that timeframe R> L, although son-in-law reports that the patient has a degree of chronic edema in the bilateral lower extremities.  Shortness of breath is not associated with any orthopnea, PND.  Denies any associate chest pain, diaphoresis, palpitations, nausea, vomiting, presyncope, or syncope.  Not associate with any wheezing, hemoptysis.Denies any recent trauma, travel, surgical procedures, or periods of prolonged diminished ambulatory status. No recent melena or hematochezia.    no recent headache, neck stiffness, rhinitis, rhinorrhea, sore throat, abdominal pain, diarrhea, or rash. No known recent COVID-19 exposures. Denies dysuria, gross hematuria, or  change in urinary urgency/frequency.   She is a lifelong non-smoker, and sun a lot reports that she has no known chronic underlying pulmonary pathology nor any baseline supplemental oxygen requirements.  He conveys that she has a history of coronary artery disease status post angioplasty approximately 30 years ago.  Denies any known history of chronic heart failure.  It appears that the patient is on daily torsemide at home, please see patient for me that this is not for heart failure, but suspect a daily basis of aforementioned mild chronic edema in the bilateral lower extremities.  Over the last 2 days, patient is also complained of generalized weakness in the absence of any acute focal weakness, acute focal numbness, paresthesias.  Additionally, some days that patient has exhibited a decline in her oral intake over the last 2 to 3 days, in the absence of any reported nausea, vomiting, or diarrhea.  In the setting of her progressive shortness of breath, the patient presented to [facility] emergency department today for further evaluation thereof.   [facility] ED Course:  Vital signs in the ED were notable for the following:  - Tetramex 100.6, heart rate 7083; blood pressure 123/56 - 140/56; respiratory rate 23-35; initial oxygen saturation noted to be 88% on room air, with subsequent improvement to 96 to 97% on 2 L nasal cannula.  Labs were notable for the following: BMP notable for sodium 136, bicarbonate 29, creatinine 1.38 relative to most recent prior value of 0.95 on 09/28/2020.  BNP 332 without any prior data point available for point comparison.  Initial high-sensitivity troponin I found to be 109, also with no prior corresponding appointment will for point comparison.  CBC notable for white cell count 18,000.  Lactic acid 2.0.  Procalcitonin  8.15.  COVID-19/influenza PCR checked in the ED today were found to be negative.  Imaging/EKG notable for the following: EKG showed sinus rhythm with  bifascicular block, heart rate 81, and no evidence of T wave or ST changes.  Chest x-ray was suggestive of infiltrates in the bilateral, left greater than right, concerning for pneumonia in the absence of any evidence of edema, effusion, or pneumothorax.  While in the ED, the following were administered: Doxycycline, Rocephin.  Subsequently, the patient was transferred to the med telemetry unit at [facility] for further evaluation management of presenting suspected severe sepsis due to community-acquired pneumonia and complicated by acute hypoxic respiratory distress.     Review of Systems: As per HPI otherwise 10 point review of systems negative.   Past Medical History:  Diagnosis Date   Arthritis    OA   Coronary artery disease    GI bleeding 10/23/2016   High cholesterol    HTN (hypertension) 05/28/2015   Hypertension    Radiation 11/19/14-12/30/14   upper right neck 5000 cGy   Squamous cell carcinoma     Past Surgical History:  Procedure Laterality Date   BACK SURGERY     CESAREAN SECTION     ROTATOR CUFF REPAIR Bilateral     Social History:  reports that she has never smoked. She has been exposed to tobacco smoke. She has never used smokeless tobacco. She reports that she does not drink alcohol and does not use drugs.   Allergies  Allergen Reactions   Codeine Itching    Hives   Gabapentin Other (See Comments)    Difficulty finding words and foggy feeling   Isoptin [Verapamil] Itching   Remeron [Mirtazapine] Other (See Comments)    Muscle cramping and pain   Trazodone And Nefazodone Other (See Comments)    Mental confusion   Aldara [Imiquimod] Rash    Redness     Family history reviewed and not pertinent    Prior to Admission medications   Medication Sig Start Date End Date Taking? Authorizing Provider  acetaminophen (TYLENOL) 500 MG tablet Take 500 mg by mouth 3 (three) times daily.    [provider]  albuterol (VENTOLIN HFA) 108 (90 Base) MCG/ACT  inhaler Inhale 2 puffs into the lungs 2 (two) times daily.    [provider]  busPIRone (BUSPAR) 5 MG tablet Take 2.5 mg by mouth 2 (two) times daily.    [provider]  cetirizine (ZYRTEC) 10 MG tablet Take 10 mg by mouth daily.    [provider]  Cholecalciferol (VITAMIN D3) 50 MCG (2000 UT) capsule Take 4,000 Units by mouth daily.    [provider]  Cranberry 425 MG CAPS Take 425 mg by mouth daily.    [provider]  Cyanocobalamin (VITAMIN B 12 PO) Take 1,000 mcg by mouth daily.    [provider]  iron polysaccharides (NIFEREX) 150 MG capsule Take 150 mg by mouth 2 (two) times daily.    [provider]  lactobacillus acidophilus (BACID) TABS tablet Take 1 tablet by mouth 2 (two) times daily.    [provider]  torsemide (DEMADEX) 20 MG tablet Take 20 mg by mouth daily.    [provider]  traMADol (ULTRAM) 50 MG tablet Take 25 mg by mouth 2 (two) times daily.    [provider]  zolpidem (AMBIEN) 10 MG tablet Take 10 mg by mouth at bedtime.    [provider]     Objective  Physical Exam: Vitals:   11/11/2020 1358 10/30/2020 1515 10/25/2020 1603 11/17/2020 1906  BP:  (!) 140/56 (!) 123/56 (!) 128/112  Pulse:  83 82 78  Resp:  (!) 35 (!) 23 16  Temp:   99.2 F (37.3 C) (!) 100.6 F (38.1 C)  TempSrc:   Oral Oral  SpO2:  99% 96% 97%  Weight: 73.9 kg     Height: 5' 1.5" (1.562 m)       General: appears to be stated age; alert, oriented;' mildly increased work of breathing noted Skin: warm, dry, no rash Head:  AT/[place] Mouth:  Oral mucosa membranes appear dry, normal dentition Neck: supple; trachea midline Heart:  RRR; did not appreciate any M/R/G Lungs: Bibasilar crackles noted, but otherwise CTAB, did not appreciate any wheezes,, or rhonchi Abdomen: + BS; soft, ND, NT Vascular: 2+ pedal pulses b/l; 2+ radial pulses b/l Extremities: 1-2+ edema in bilateral lower extremities,  right greater than left n,  no muscle wasting Neuro: strength and sensation intact in upper and lower extremities b/l   Labs on Admission: I have personally reviewed following labs and imaging studies  CBC: Recent Labs  Lab 10/25/2020 1408  WBC 18.0*  HGB 12.6  HCT 38.9  MCV 85.9  PLT 110   Basic Metabolic Panel: Recent Labs  Lab 11/22/2020 1408  NA 136  K 4.5  CL 96*  CO2 29  GLUCOSE 149*  BUN 28*  CREATININE 1.38*  CALCIUM 10.1   GFR: Estimated Creatinine Clearance: 23.2 mL/min (A) (by C-G formula based on SCr of 1.38 mg/dL (H)). Liver Function Tests: No results for input(s): AST, ALT, ALKPHOS, BILITOT, PROT, ALBUMIN in the last 168 hours. No results for input(s): LIPASE, AMYLASE in the last 168 hours. No results for input(s): AMMONIA in the last 168 hours. Coagulation Profile: No results for input(s): INR, PROTIME in the last 168 hours. Cardiac Enzymes: No results for input(s): CKTOTAL, CKMB, CKMBINDEX, TROPONINI in the last 168 hours. BNP (last 3 results) No results for input(s): PROBNP in the last 8760 hours. HbA1C: No results for input(s): HGBA1C in the last 72 hours. CBG: No results for input(s): GLUCAP in the last 168 hours. Lipid Profile: No results for input(s): CHOL, HDL, LDLCALC, TRIG, CHOLHDL, LDLDIRECT in the last 72 hours. Thyroid Function Tests: No results for input(s): TSH, T4TOTAL, FREET4, T3FREE, THYROIDAB in the last 72 hours. Anemia Panel: No results for input(s): VITAMINB12, FOLATE, FERRITIN, TIBC, IRON, RETICCTPCT in the last 72 hours. Urine analysis:    Component Value Date/Time   COLORURINE YELLOW (A) 03/10/2017 1549   APPEARANCEUR HAZY (A) 03/10/2017 1549   LABSPEC 1.011 03/10/2017 1549   PHURINE 6.0 03/10/2017 1549   GLUCOSEU NEGATIVE 03/10/2017 1549   HGBUR NEGATIVE 03/10/2017 1549   BILIRUBINUR NEGATIVE 03/10/2017 1549   KETONESUR NEGATIVE 03/10/2017 1549   PROTEINUR NEGATIVE 03/10/2017 1549   UROBILINOGEN 0.2 08/23/2006 2210    NITRITE NEGATIVE 03/10/2017 1549   LEUKOCYTESUR SMALL (A) 03/10/2017 1549    Radiological Exams on Admission: DG Chest 2 View  Result Date: 11/21/2020 CLINICAL DATA:  Shortness of breath EXAM: CHEST - 2 VIEW COMPARISON:  05/28/2015 FINDINGS: Heart size is normal. Chronic aortic atherosclerosis. Patchy bilateral lower lobe pneumonia, left worse than right. No dense consolidation or lobar collapse. No visible effusion. No acute bone finding. IMPRESSION: Patchy infiltrates in both lower lobes, left more than right, consistent with bronchopneumonia. Electronically Signed   By: Nelson Chimes M.D.   On: 11/13/2020 15:20  EKG: Independently reviewed, with result as described above.    Assessment/Plan     Principal Problem:   Acute respiratory failure with hypoxia (HCC) Active Problems:   HTN (hypertension)   Coronary artery disease   Acute CHF (congestive heart failure) (HCC)   CAP (community acquired pneumonia)   Obesity (BMI 30-39.9)   Severe sepsis (HCC)   AKI (acute kidney injury) ([place])   Generalized weakness   Elevated troponin     #) Severe sepsis due to [facility] pneumonia: Diagnosis on the basis of to 3 days of progressive shortness of breath associated with new onset cough, fever, elevated procalcitonin of greater than 8, with chest x-ray showing evidence of infiltrates in the bilateral lower lobes concerning for pneumonia in the absence of any evidence of pulmonary edema, effusion, or pneumothorax.  SIRS criteria met via presenting objective fever and tachypnea as well as leukocytosis.  Patient sepsis meets criteria to be considered severe nature on the basis of concomitant endorgan damage in the form of presenting acute kidney injury as well as acute hypoxic respiratory distress, in addition to mildly elevated lactate of 2.0.  No evidence of associated hypotension. In the absence of lactic acid level that is greater than or equal to 4.0, and in the absence of any associated  hypotension, there are no indications for administration of a 30 mL/kg IVF bolus at this time.  Additionally, given that there is some concern for potential element of acutely consider failure, will refrain from aggressive IV fluids while further pursuing this cardiac evaluation, as further detailed below.  Of note, Rocephin and doxycycline were initiated at [facility] today.  In the absence of QTC prolongation or the patient being on P4 50 dependent medications at home, the rationale for doxycycline is not entirely clear to me at this time relative to azithromycin.   Plan: Repeat lactic acid level.  Check blood cultures x2.  Continue doxycycline and Rocephin.  Repeat CBC with differential in the morning.  Check strep pneumonia urine antigen as well as mycoplasma antibodies.  Flutter valve.  Check urinalysis.  Monitor on telemetry.  Monitor continuous pulse oximetry.     #) Acute heart failure with preserved EF: There is suspicion for potential element of acutely decompensated heart failure in the absence of any known prior diagnosis of such, with elevated presenting BNP, with the caveat of no prior BNP data point available for point comparison, along with family's report of recent increase in her chronic lower extremity edema.  Of note, chest x-ray without overt evidence of pulmonary edema and showed no evidence of pleural effusion.  Presentation is associated with mildly elevated initial troponin, this is suspected to be on the basis of type II supply demand mismatch as opposed to representing a type I ischemia consistent with ACS, as further detailed below.  will provide a dose of IV Lasix, and closely monitor ensuing volume status.  Additionally, will check echocardiogram.  In terms of factors that may speak against acutely decompensated heart failure, the patient's serum bicarbonate of 29 is noted, potentially representing early contraction alkalosis in the context of report of significant decline in  oral intake over the last few days.    Plan: Lasix 40 mg IV x1 now.  Echocardiogram in the morning.  Trend still troponin.  Monitor on telemetry.  Monitor continuous pulse oximetry.  Add on serum magnesium level.  CMP in the morning, with attention to interval renal function as well as serum potassium level, bicarbonate.       #)  Acute hypoxic respiratory distress: In the context of no known baseline supplemental oxygen requirements, initial O2 sats in the high 80s on room air, subsequent improving into the range of 96 to 97% on 2 L nasal cannula, which, in the context of concomitant shortness of breath, meets criteria for acute hypoxic respiratory distress as opposed to failure.  This is potentially multifactorial in its etiology, with suspected contribution from severe sepsis due to community-acquired pneumonia, as above, also with the possibility of an element of new diagnosis of acute decompensated heart failure, as further detailed above.  In the context of fever, shortness of breath, and recent right greater than left worsening edema lower extremities, will also check D-dimer to screen for acute pulmonary embolism.  If positive, would likely proceed CTA chest, which could also provide further radiographic discernment between pneumonia versus heart failure, as above.  ACS is felt to be less likely, especially elevated initial troponin is felt to be on the basis of decreased vitamin mismatch, as further detailed below.  No reported history of chronic underlying pulmonary pathology and this lifelong non-smoker.  Plan: Further evaluation management of severe sepsis due to suspected bilateral pneumonia, as above, including IV antibiotics as above.  Echocardiogram.  Monitor on telemetry.  Monitor to as well as history.  Add on serum magnesium and phosphorus levels.  Monitor strict I's and O's and daily weights.  Check VBG.  D-dimer, as above.     #) Elevated troponin: mildly elevated initial troponin  of 109. No prior high sensitivity troponin I value available for point of comparison.  Suspect that this mildly elevated troponin is on the basis of supply demand mismatch in the setting of diminished oxygen delivery capacity as a consequence of acute hypoxic respiratory distress as opposed to representing a type I process due to acute plaque rupture. Additionally, relative decline in renal clearance of troponin in the setting of AKI is likely also contributing to presenting troponin elevation. EKG shows no evidence of acute ischemic changes, including no evidence of STEMI.  Additionally, presentation is not associated with any CP.  Overall, ACS is felt to be less likely relative to type 2 supply demand mismatch, as above, but will closely monitor on telemetry overnight while treating suspected underlying bilateral pneumonia, as further described above, screening for D-dimer.    Plan: Continue to trend troponin.  Monitor on telemetry. PRN EKG for development of chest pain. PRN sublingual nitroglycerin for CP. Check serum Mg level and repeat BMP in the morning.  Repeat CBC in the morning.  Check D-dimer, as above.  Echocardiogram is ordered for the morning.      #) Generalized weakness: 2 to 3 days of generalized weakness in the absence of any acute focal neurologic deficits to suggest acute CVA.  Suspect that this is multifactorial in etiology, with contributions from severe sepsis due to bilateral pneumonia by acute hypoxic respiratory distress.  No overt evidence of additional underlying infectious process at this time, including COVID-19/influenza PCR that found to be negative.  will also check urinalysis.   Plan: Further evaluation management is for sepsis due to bilateral pneumonia.  Fall precautions.  Check TSH, MMA.  Physical therapy consult has been placed for the morning.  Repeat CMP and CBC in the morning.  Check urinalysis.      #) Acute kidney injury presenting creatinine 1.38 relative  to most recent prior value 0.95 on 09/28/2020.  While specific mechanism is still to be determined, presenting AKI appears to be  prerenal in nature, with differential including diminished oxygen delivery capacity to the kidneys as a consequence of acute hypoxic respiratory distress versus dehydration from 2 to 3 days of significant decline in oral intake.  Differential could also include diminished renal perfusion gradient given potential element of constant heart failure evaluation, as detailed above.  Plan: Check urinalysis with microscopy.  Add ARB intermittent symptoms random urine creatinine.  Monitor strict I's and O's and daily weights.  Repeat BMP in the morning.       #) Allergic rhinitis: Documented history of such, on Zyrtec as an outpatient.  Plan: Continue home antihistamine.     DVT prophylaxis: scd's   Code Status: DNR (including support ACP documents) Family Communication: Case discussed with the patient's son-in-law, who is present at bedside Disposition Plan: Per Rounding Team Consults called: none;  Admission status: Inpatient; med telemetry     Of note, this patient was added by me to the following Admit List/Treatment Team: [email or web address].    Of note, the Adult Admission Order Set (Multimorbid order set) was used by me in the admission process for this patient.   PLEASE NOTE THAT DRAGON DICTATION SOFTWARE WAS USED IN THE CONSTRUCTION OF THIS NOTE.   Rhetta Mura DO [facility] Pager [phone] From [place]   11/14/2020, 7:41 PM

## 2020-11-16 ENCOUNTER — Encounter (HOSPITAL_COMMUNITY): Payer: Self-pay | Admitting: Internal Medicine

## 2020-11-16 ENCOUNTER — Other Ambulatory Visit: Payer: Self-pay

## 2020-11-16 ENCOUNTER — Inpatient Hospital Stay (HOSPITAL_COMMUNITY): Payer: Medicare Other

## 2020-11-16 DIAGNOSIS — I509 Heart failure, unspecified: Secondary | ICD-10-CM

## 2020-11-16 DIAGNOSIS — R531 Weakness: Secondary | ICD-10-CM

## 2020-11-16 DIAGNOSIS — R778 Other specified abnormalities of plasma proteins: Secondary | ICD-10-CM | POA: Diagnosis present

## 2020-11-16 DIAGNOSIS — I342 Nonrheumatic mitral (valve) stenosis: Secondary | ICD-10-CM

## 2020-11-16 DIAGNOSIS — A419 Sepsis, unspecified organism: Secondary | ICD-10-CM | POA: Diagnosis present

## 2020-11-16 DIAGNOSIS — J9601 Acute respiratory failure with hypoxia: Secondary | ICD-10-CM | POA: Diagnosis not present

## 2020-11-16 DIAGNOSIS — I351 Nonrheumatic aortic (valve) insufficiency: Secondary | ICD-10-CM

## 2020-11-16 DIAGNOSIS — N179 Acute kidney failure, unspecified: Secondary | ICD-10-CM | POA: Diagnosis present

## 2020-11-16 DIAGNOSIS — I350 Nonrheumatic aortic (valve) stenosis: Secondary | ICD-10-CM | POA: Diagnosis not present

## 2020-11-16 LAB — COMPREHENSIVE METABOLIC PANEL
ALT: 22 U/L (ref 0–44)
AST: 28 U/L (ref 15–41)
Albumin: 3.3 g/dL — ABNORMAL LOW (ref 3.5–5.0)
Alkaline Phosphatase: 86 U/L (ref 38–126)
Anion gap: 12 (ref 5–15)
BUN: 25 mg/dL — ABNORMAL HIGH (ref 8–23)
CO2: 24 mmol/L (ref 22–32)
Calcium: 8.9 mg/dL (ref 8.9–10.3)
Chloride: 96 mmol/L — ABNORMAL LOW (ref 98–111)
Creatinine, Ser: 1.21 mg/dL — ABNORMAL HIGH (ref 0.44–1.00)
GFR, Estimated: 42 mL/min — ABNORMAL LOW (ref >60)
Glucose, Bld: 202 mg/dL — ABNORMAL HIGH (ref 70–99)
Potassium: 3.7 mmol/L (ref 3.5–5.1)
Sodium: 132 mmol/L — ABNORMAL LOW (ref 135–145)
Total Bilirubin: 1 mg/dL (ref 0.3–1.2)
Total Protein: 7.6 g/dL (ref 6.5–8.1)

## 2020-11-16 LAB — ECHOCARDIOGRAM COMPLETE
AR max vel: 1.32 cm2
AV Area VTI: 1.72 cm2
AV Area mean vel: 1.12 cm2
AV Mean grad: 17 mmHg
AV Peak grad: 26.3 mmHg
Ao pk vel: 2.57 m/s
Area-P 1/2: 2.78 cm2
Height: 61.5 in
MV VTI: 1.77 cm2
P 1/2 time: 305 msec
S' Lateral: 2 cm
Weight: 2694.9 oz

## 2020-11-16 LAB — TROPONIN I (HIGH SENSITIVITY): Troponin I (High Sensitivity): 89 ng/L — ABNORMAL HIGH (ref <18)

## 2020-11-16 LAB — CBC WITH DIFFERENTIAL/PLATELET
Abs Immature Granulocytes: 0.1 10*3/uL — ABNORMAL HIGH (ref 0.00–0.07)
Basophils Absolute: 0.1 10*3/uL (ref 0.0–0.1)
Basophils Relative: 0 %
Eosinophils Absolute: 0.1 10*3/uL (ref 0.0–0.5)
Eosinophils Relative: 0 %
HCT: 37 % (ref 36.0–46.0)
Hemoglobin: 12 g/dL (ref 12.0–15.0)
Immature Granulocytes: 1 %
Lymphocytes Relative: 13 %
Lymphs Abs: 1.8 10*3/uL (ref 0.7–4.0)
MCH: 28.3 pg (ref 26.0–34.0)
MCHC: 32.4 g/dL (ref 30.0–36.0)
MCV: 87.3 fL (ref 80.0–100.0)
Monocytes Absolute: 1.4 10*3/uL — ABNORMAL HIGH (ref 0.1–1.0)
Monocytes Relative: 10 %
Neutro Abs: 10.5 10*3/uL — ABNORMAL HIGH (ref 1.7–7.7)
Neutrophils Relative %: 76 %
Platelets: 322 10*3/uL (ref 150–400)
RBC: 4.24 MIL/uL (ref 3.87–5.11)
RDW: 14.7 % (ref 11.5–15.5)
WBC: 14 10*3/uL — ABNORMAL HIGH (ref 4.0–10.5)
nRBC: 0 % (ref 0.0–0.2)

## 2020-11-16 LAB — D-DIMER, QUANTITATIVE: D-Dimer, Quant: 2.18 ug/mL-FEU — ABNORMAL HIGH (ref 0.00–0.50)

## 2020-11-16 LAB — BLOOD GAS, VENOUS
Acid-Base Excess: 3.3 mmol/L — ABNORMAL HIGH (ref 0.0–2.0)
Bicarbonate: 28.3 mmol/L — ABNORMAL HIGH (ref 20.0–28.0)
O2 Saturation: 85.2 %
Patient temperature: 98.6
pCO2, Ven: 47.1 mmHg (ref 44.0–60.0)
pH, Ven: 7.397 (ref 7.250–7.430)
pO2, Ven: 51.9 mmHg — ABNORMAL HIGH (ref 32.0–45.0)

## 2020-11-16 LAB — MAGNESIUM: Magnesium: 2 mg/dL (ref 1.7–2.4)

## 2020-11-16 LAB — STREP PNEUMONIAE URINARY ANTIGEN: Strep Pneumo Urinary Antigen: NEGATIVE

## 2020-11-16 LAB — PHOSPHORUS: Phosphorus: 3.4 mg/dL (ref 2.5–4.6)

## 2020-11-16 MED ORDER — IOHEXOL 350 MG/ML SOLN
60.0000 mL | Freq: Once | INTRAVENOUS | Status: AC | PRN
  Administered 2020-11-16: 60 mL via INTRAVENOUS

## 2020-11-16 MED ORDER — FUROSEMIDE 10 MG/ML IJ SOLN
40.0000 mg | Freq: Once | INTRAMUSCULAR | Status: AC
  Administered 2020-11-16: 40 mg via INTRAVENOUS

## 2020-11-16 MED ORDER — FUROSEMIDE 10 MG/ML IJ SOLN
40.0000 mg | Freq: Two times a day (BID) | INTRAMUSCULAR | Status: DC
  Administered 2020-11-16 – 2020-11-19 (×6): 40 mg via INTRAVENOUS
  Filled 2020-11-16 (×7): qty 4

## 2020-11-16 MED ORDER — ENOXAPARIN SODIUM 80 MG/0.8ML IJ SOSY
80.0000 mg | PREFILLED_SYRINGE | INTRAMUSCULAR | Status: DC
  Administered 2020-11-16: 80 mg via SUBCUTANEOUS
  Filled 2020-11-16: qty 0.8

## 2020-11-16 MED ORDER — IPRATROPIUM BROMIDE 0.02 % IN SOLN
RESPIRATORY_TRACT | Status: AC
  Administered 2020-11-16: 0.5 mg via RESPIRATORY_TRACT
  Filled 2020-11-16: qty 2.5

## 2020-11-16 MED ORDER — MORPHINE SULFATE (PF) 2 MG/ML IV SOLN
2.0000 mg | INTRAVENOUS | Status: DC | PRN
  Administered 2020-11-16 – 2020-11-19 (×27): 2 mg via INTRAVENOUS
  Filled 2020-11-16 (×29): qty 1

## 2020-11-16 MED ORDER — METOPROLOL TARTRATE 5 MG/5ML IV SOLN
5.0000 mg | Freq: Once | INTRAVENOUS | Status: AC
  Administered 2020-11-16: 5 mg via INTRAVENOUS

## 2020-11-16 MED ORDER — DILTIAZEM HCL-DEXTROSE 125-5 MG/125ML-% IV SOLN (PREMIX)
5.0000 mg/h | INTRAVENOUS | Status: AC
  Administered 2020-11-16: 5 mg/h via INTRAVENOUS
  Administered 2020-11-17: 10 mg/h via INTRAVENOUS
  Filled 2020-11-16 (×2): qty 125

## 2020-11-16 MED ORDER — MORPHINE SULFATE (PF) 2 MG/ML IV SOLN
2.0000 mg | Freq: Once | INTRAVENOUS | Status: AC
Start: 2020-11-16 — End: 2020-11-16

## 2020-11-16 MED ORDER — LIP MEDEX EX OINT
TOPICAL_OINTMENT | CUTANEOUS | Status: DC | PRN
  Administered 2020-11-16: 75 via TOPICAL
  Filled 2020-11-16: qty 7

## 2020-11-16 MED ORDER — DOXYCYCLINE HYCLATE 100 MG PO TABS
100.0000 mg | ORAL_TABLET | Freq: Two times a day (BID) | ORAL | Status: DC
  Administered 2020-11-16 – 2020-11-18 (×2): 100 mg via ORAL
  Filled 2020-11-16 (×4): qty 1

## 2020-11-16 MED ORDER — MORPHINE SULFATE (PF) 2 MG/ML IV SOLN
INTRAVENOUS | Status: AC
  Administered 2020-11-16: 2 mg via INTRAVENOUS
  Filled 2020-11-16: qty 1

## 2020-11-16 MED ORDER — METOPROLOL TARTRATE 5 MG/5ML IV SOLN
INTRAVENOUS | Status: AC
  Filled 2020-11-16: qty 5

## 2020-11-16 MED ORDER — MORPHINE SULFATE (PF) 4 MG/ML IV SOLN
4.0000 mg | Freq: Once | INTRAVENOUS | Status: AC
  Administered 2020-11-16: 4 mg via INTRAVENOUS
  Filled 2020-11-16: qty 1

## 2020-11-16 MED ORDER — ENOXAPARIN SODIUM 30 MG/0.3ML IJ SOSY: 30.0000 mg | PREFILLED_SYRINGE | INTRAMUSCULAR | Status: DC

## 2020-11-16 MED ORDER — DILTIAZEM HCL ER 60 MG PO CP12
60.0000 mg | ORAL_CAPSULE | Freq: Two times a day (BID) | ORAL | Status: DC
  Filled 2020-11-16: qty 1

## 2020-11-16 MED ORDER — IPRATROPIUM BROMIDE 0.02 % IN SOLN: 0.5000 mg | Freq: Once | RESPIRATORY_TRACT | Status: AC

## 2020-11-16 MED ORDER — DILTIAZEM HCL 25 MG/5ML IV SOLN
5.0000 mg | Freq: Once | INTRAVENOUS | Status: AC
  Administered 2020-11-16: 5 mg via INTRAVENOUS
  Filled 2020-11-16: qty 5

## 2020-11-16 NOTE — Progress Notes (Signed)
1630: Pt in Afib with RVR, STAT EKG performed to confirm. MD [place] paged to make aware. See orders for Diltiazem IV push. Diltiazem push given without improvement in HR. MD paged to make aware. See orders for Diltiazem infusion. Pt experiencing increased respiration rate and O2 need. Pt O2 titrated from 4 up to 6L [place].  Diltiazem infusion started without improvement in HR.  Rapid response called for HR and increased work of breathing and chest pain. MD [place] paged to make aware. Pt given ipratropium breathing treatment for wheezing. Diltiazem infusion titrated up without improvement in HR.  During rapid response MD at bedside. Diltiazem titrated up to 12.5, Metoprolol IV push given , Lasix IV given and Morphine given for work of breathing (See MAR). Pt placed on NRB for comfort. Decision made with family to avoid transfer to step down for CPAP/ BiPAP in favor of focusing on comfort. See new orders.   Pt appears to be comfortable at this time. Pt family at bedside. Report hand off given to night shift RN.

## 2020-11-16 NOTE — Plan of Care (Signed)
  Problem: Elimination: Goal: Will not experience complications related to bowel motility Outcome: Progressing Goal: Will not experience complications related to urinary retention Outcome: Progressing   

## 2020-11-16 NOTE — Evaluation (Signed)
Physical Therapy Evaluation Patient Details Name: Jamie Brown MRN: [id number] DOB: 1924/11/10 Today's Date: 11/16/2020  History of Present Illness  87 y.o. female who was admitted to [facility] on 11/17/2020 by way of transfer from [facility] emergency department with suspected severe sepsis due to community-acquired pneumonia with potential additional element of acutely decompensated heart failure with preserved EF after presenting from home to [facility] ED complaining of shortness of breath.  PMHx: dementia, coronary disease status post angioplasty approximately 30 years ago, generalized anxiety disorder, allergic rhinitis, bil rotator cuff repair, HTN  Clinical Impression  Pt admitted with above diagnosis.  Pt currently with functional limitations due to the deficits listed below (see PT Problem List). Pt will benefit from skilled PT to increase their independence and safety with mobility to allow discharge to the venue listed below.  Pt from ALF and typically has caregiver during day due to increased falls per daughter.  Pt may benefit from SNF upon d/c if ALF is unable to provide physical assist since pt is at least mod assist for transfers at this time.      Recommendations for follow up therapy are one component of a multi-disciplinary discharge planning process, led by the attending physician.  Recommendations may be updated based on patient status, additional functional criteria and insurance authorization.  Follow Up Recommendations Skilled nursing-short term rehab (<3 hours/day)    Assistance Recommended at Discharge Other (comment) (daughter has hired caregiver for day assist due to frequent falls)  Functional Status Assessment Patient has had a recent decline in their functional status and demonstrates the ability to make significant improvements in function in a reasonable and predictable amount of time.  Equipment Recommendations  None recommended by PT    Recommendations for  Other Services       Precautions / Restrictions Precautions Precautions: Fall Precaution Comments: monitor sats      Mobility  Bed Mobility Overal bed mobility: Needs Assistance Bed Mobility: Supine to Sit;Rolling;Sidelying to Sit Rolling: Min assist Sidelying to sit: Mod assist       General bed mobility comments: assist due to weakness    Transfers Overall transfer level: Needs assistance Equipment used: Rolling walker (2 wheels) Transfers: Sit to/from [employer] Transfers Sit to Stand: Mod assist Stand pivot transfers: Mod assist         General transfer comment: assist to rise and steady, pt requiring physical assist for steadying and due to weakness to take a few steps over to recliner, remained on 4L O2 [place] and SPO2 92%    Ambulation/Gait                Stairs            Wheelchair Mobility    Modified Rankin (Stroke Patients Only)       Balance Overall balance assessment: History of Falls;Needs assistance         Standing balance support: Reliant on assistive device for balance;Bilateral upper extremity supported Standing balance-Leahy Scale: Zero Standing balance comment: requiring UE support and external assist                             Pertinent Vitals/Pain Pain Assessment: No/denies pain    Home Living Family/patient expects to be discharged to:: Assisted living                 Home Equipment: Conservation officer, nature (2 wheels)      Prior Function  Prior Level of Function : Needs assist       Physical Assist : Mobility (physical) Mobility (physical): Transfers;Gait   Mobility Comments: daughter reports pt with frequent falls, so pt now has caregiver during day       Hand Dominance        Extremity/Trunk Assessment        Lower Extremity Assessment Lower Extremity Assessment: Generalized weakness       Communication   Communication: HOH  Cognition Arousal/Alertness: Awake/alert Behavior  During Therapy: Flat affect Overall Cognitive Status: Within Functional Limits for tasks assessed                                 General Comments: little verbalizations, follows commands        General Comments      Exercises     Assessment/Plan    PT Assessment Patient needs continued PT services  PT Problem List Decreased strength;Decreased mobility;Decreased activity tolerance;Decreased balance;Decreased knowledge of use of DME;Decreased safety awareness;Cardiopulmonary status limiting activity       PT Treatment Interventions DME instruction;Gait training;Balance training;Therapeutic exercise;Functional mobility training;Therapeutic activities;Patient/family education    PT Goals (Current goals can be found in the Care Plan section)  Acute Rehab PT Goals PT Goal Formulation: With patient Time For Goal Achievement: 11/30/20 Potential to Achieve Goals: Fair    Frequency Min 2X/week   Barriers to discharge        Co-evaluation               AM-PAC PT "6 Clicks" Mobility  Outcome Measure Help needed turning from your back to your side while in a flat bed without using bedrails?: A Lot Help needed moving from lying on your back to sitting on the side of a flat bed without using bedrails?: A Lot Help needed moving to and from a bed to a chair (including a wheelchair)?: A Lot Help needed standing up from a chair using your arms (e.g., wheelchair or bedside chair)?: A Lot Help needed to walk in hospital room?: Total Help needed climbing 3-5 steps with a railing? : Total 6 Click Score: 10    End of Session Equipment Utilized During Treatment: Gait belt;Oxygen Activity Tolerance: Patient tolerated treatment well Patient left: with call bell/phone within reach;in chair;with chair alarm set;with family/visitor present Nurse Communication: Mobility status PT Visit Diagnosis: Difficulty in walking, not elsewhere classified (R26.2);Muscle weakness  (generalized) (M62.81);History of falling (Z91.81)    Time: 1045-1100 PT Time Calculation (min) (ACUTE ONLY): 15 min   Charges:   PT Evaluation $PT Eval Low Complexity: 1 Low        Kati PT, DPT Acute Rehabilitation Services Pager: [phone] Office: [place] 11/16/2020, 12:32 PM

## 2020-11-16 NOTE — Progress Notes (Addendum)
PROGRESS NOTE    Jamie Brown  JQB:[id number] DOB: 08/30/1924 DOA: 11/10/2020 PCP: System, Provider Not In   Brief Narrative:  Jamie Brown is a 87 y.o. female with medical history significant for dementia, coronary disease status post angioplasty approximately 30 years ago, generalized anxiety disorder, allergic rhinitis, who is admitted to [facility] on 11/17/2020 by way of transfer from [facility] emergency department with suspected severe sepsis due to community-acquired pneumonia with potential additional element of acutely decompensated heart failure with preserved EF after presenting from home to [facility] ED complaining of shortness of breath. Found to have CAP based on imaging and hypoxia meeting sepsis criteria as well as questionable heart failure exacerbation.  **Evening update, rapid response note Patient unfortunately had worsening respiratory status tachycardia and tachypnea late this afternoon, EKG shows new onset provoked A. fib with increasing RVR, p.o. diltiazem and IV diltiazem push more minimally effective, diltiazem drip initiated.  Unfortunately patient's respiratory status continued to worsen.  Rapid response was called, patient's daughter and son were at bedside.  Lengthy discussion about escalation of care, patient would not tolerate BiPAP as such will not transfer to the ICU for closer monitoring, transitioning to comfort measures with IV morphine 2 mg every hour, patient tolerated quite well done rapid response, metoprolol 5 mg push was also utilized to decrease patient's RVR, much more controlled now around 100 to 120 bpm, patient remains somewhat tachypneic to the 20s and 30s but is no longer hypoxic or in respiratory distress.  If patient continues to worsen overnight we discussed initiation of morphine drip which family was agreeable to.  Overnight team was updated on plan  Assessment & Plan:   Acute hypoxic respiratory failure; multifactorial Sepsis secondary to  CAP New onset acute heart failure exacerbation ruled out -Continue broad-spectrum antibiotics with ceftriaxone and doxycycline for pneumonia coverage -Continue diuretics for volume overload with presumed heart failure exacerbation, this would be a new diagnosis for the patient per discussion with daughter and granddaughter at bedside -**Update** - Echocardiogram showing EF 70 to 75% somewhat hyperdynamic with no regional wall motion abnormalities.  See below for discussion on stenosis/regurgitation. -Discussed goals of care, patient does follow palliative care outpatient, daughter questioning if new diagnosis of heart failure will advance the patient towards hospice which we discussed is unlikely if she continues to improve with diuretics and manages her diet fluid intake and salt intake well  Moderate mitral valve stenosis Mild to moderate aortic valve stenosis with mild regurgitation -Likely the etiology behind patient's hypervolemia -Discussed with cardiology, no indication for intervention given patient's age and comorbid conditions given that he would be high risk procedures, continue medical management with diuresis, heart rate control and blood pressure control. -Initiating patient on diltiazem today given ongoing tachycardia  New afib w/ RVR - likely provoked given above. -Attempted p.o. and IV push diltiazem as above, transition to diltiazem drip late this afternoon given increased heart rate and symptoms -EKG this afternoon shows A. fib with RVR -rate around 120 -Without ST elevations -EKG shows right bundle branch block which appears to be native given previous EKG as well  Troponin, minimally elevated in the setting of above, likely reactive, ACS unlikely - No longer following - patient has old history of cardiac angioplasty but remains without symptoms other than shortness of breath   Lactic acidosis -Likely in the setting of hypervolemia low flow state and hypoxia, continue  supportive care  Elevated D dimer -Negative CT chest for PE despite hypoxia  and tachycardia  Ambulatory dysfunction- acute on chronic -PT OT to continue to follow, may benefit from ongoing physical therapy defer to their expertise  AKI -As above in the setting of volume overload and ongoing diuresis -Monitor closely given need for ongoing diuretics  DVT prophylaxis: Lovenox Code Status: DNR Family Communication: Daughter and granddaughter at bedside  Status is: Inpatient  Dispo: The patient is from: Assisted living facility              Anticipated d/c is to: To be determined              Anticipated d/c date is: 48 to 72 hours              Patient currently not medically stable for discharge  Consultants:  None  Procedures:  None  Antimicrobials:  Ceftriaxone/doxycycline   Subjective: No acute issue/events overnight, patient denies nausea vomiting diarrhea constipation headache fevers chills or chest pain, shortness of breath ongoing, review of systems somewhat limited due to patient's baseline mental status, daughter and granddaughter at bedside concur that she appears more dyspneic than usual.  Late afternoon patient had notable episode of A. fib with RVR which appears to be new onset, respiratory status minimally worsening but patient continues to deny chest pain.  Objective: Vitals:   11/16/20 0001 11/16/20 0548 11/16/20 0552 11/16/20 0600  BP: 136/72 (!) 126/57    Pulse:  91    Resp:  20    Temp:  98.9 F (37.2 C)    TempSrc:  Oral    SpO2:  94% 94%   Weight:    76.4 kg  Height:        Intake/Output Summary (Last 24 hours) at 11/16/2020 0746 Last data filed at 11/16/2020 0500 Gross per 24 hour  Intake 919.32 ml  Output 700 ml  Net 219.32 ml   Filed Weights   11/08/2020 1358 11/16/20 0600  Weight: 73.9 kg 76.4 kg    Examination:  General exam: Appears calm and comfortable  Respiratory system: R sided wheeze with basilar rales. Cardiovascular system:  S1 & S2 heard, RRR. No JVD, murmurs, rubs, gallops or clicks. No pedal edema. Gastrointestinal system: Abdomen is nondistended, soft and nontender. No organomegaly or masses felt. Normal bowel sounds heard. Central nervous system: Alert and oriented. No focal neurological deficits. Extremities: Symmetric 5 x 5 power. Skin: No rashes, lesions or ulcers Psychiatry: Judgement and insight appear normal. Mood & affect appropriate.     Data Reviewed: I have personally reviewed following labs and imaging studies  CBC: Recent Labs  Lab 10/30/2020 1408 11/16/20 0037  WBC 18.0* 14.0*  NEUTROABS  --  10.5*  HGB 12.6 12.0  HCT 38.9 37.0  MCV 85.9 87.3  PLT 333 086   Basic Metabolic Panel: Recent Labs  Lab 10/30/2020 1408 10/30/2020 1958 11/16/20 0037  NA 136  --  132*  K 4.5  --  3.7  CL 96*  --  96*  CO2 29  --  24  GLUCOSE 149*  --  202*  BUN 28*  --  25*  CREATININE 1.38*  --  1.21*  CALCIUM 10.1  --  8.9  MG  --  2.0 2.0  PHOS  --   --  3.4   GFR: Estimated Creatinine Clearance: 26.9 mL/min (A) (by C-G formula based on SCr of 1.21 mg/dL (H)). Liver Function Tests: Recent Labs  Lab 11/16/20 0037  AST 28  ALT 22  ALKPHOS 86  BILITOT 1.0  PROT 7.6  ALBUMIN 3.3*   No results for input(s): LIPASE, AMYLASE in the last 168 hours. No results for input(s): AMMONIA in the last 168 hours. Coagulation Profile: No results for input(s): INR, PROTIME in the last 168 hours. Cardiac Enzymes: No results for input(s): CKTOTAL, CKMB, CKMBINDEX, TROPONINI in the last 168 hours. BNP (last 3 results) No results for input(s): PROBNP in the last 8760 hours. HbA1C: No results for input(s): HGBA1C in the last 72 hours. CBG: No results for input(s): GLUCAP in the last 168 hours. Lipid Profile: No results for input(s): CHOL, HDL, LDLCALC, TRIG, CHOLHDL, LDLDIRECT in the last 72 hours. Thyroid Function Tests: Recent Labs    11/02/2020 2111  TSH 1.158   Anemia Panel: No results for  input(s): VITAMINB12, FOLATE, FERRITIN, TIBC, IRON, RETICCTPCT in the last 72 hours. Sepsis Labs: Recent Labs  Lab 10/25/2020 1655 10/24/2020 2111  PROCALCITON 8.15  --   LATICACIDVEN 2.0* 1.6    Recent Results (from the past 240 hour(s))  Resp Panel by RT-PCR (Flu A&B, Covid) Nasopharyngeal Swab     Status: None   Collection Time: 11/04/2020  2:12 PM   Specimen: Nasopharyngeal Swab; Nasopharyngeal(NP) swabs in vial transport medium  Result Value Ref Range Status   SARS Coronavirus 2 by RT PCR NEGATIVE NEGATIVE Final    Comment: (NOTE) SARS-CoV-2 target nucleic acids are NOT DETECTED.  The SARS-CoV-2 RNA is generally detectable in upper respiratory specimens during the acute phase of infection. The lowest concentration of SARS-CoV-2 viral copies this assay can detect is 138 copies/mL. A negative result does not preclude SARS-Cov-2 infection and should not be used as the sole basis for treatment or other patient management decisions. A negative result may occur with  improper specimen collection/handling, submission of specimen other than nasopharyngeal swab, presence of viral mutation(s) within the areas targeted by this assay, and inadequate number of viral copies(<138 copies/mL). A negative result must be combined with clinical observations, patient history, and epidemiological information. The expected result is Negative.  Fact Sheet for Patients:  EntrepreneurPulse.com.au  Fact Sheet for Healthcare Providers:  IncredibleEmployment.be  This test is no t yet approved or cleared by the [place] FDA and  has been authorized for detection and/or diagnosis of SARS-CoV-2 by FDA under an Emergency Use Authorization (EUA). This EUA will remain  in effect (meaning this test can be used) for the duration of the COVID-19 declaration under Section 564(b)(1) of the Act, 21 U.S.C.section 360bbb-3(b)(1), unless the authorization is terminated  or  revoked sooner.       Influenza A by PCR NEGATIVE NEGATIVE Final   Influenza B by PCR NEGATIVE NEGATIVE Final    Comment: (NOTE) The Xpert Xpress SARS-CoV-2/FLU/RSV plus assay is intended as an aid in the diagnosis of influenza from Nasopharyngeal swab specimens and should not be used as a sole basis for treatment. Nasal washings and aspirates are unacceptable for Xpert Xpress SARS-CoV-2/FLU/RSV testing.  Fact Sheet for Patients: EntrepreneurPulse.com.au  Fact Sheet for Healthcare Providers: IncredibleEmployment.be  This test is not yet approved or cleared by the [place] FDA and has been authorized for detection and/or diagnosis of SARS-CoV-2 by FDA under an Emergency Use Authorization (EUA). This EUA will remain in effect (meaning this test can be used) for the duration of the COVID-19 declaration under Section 564(b)(1) of the Act, 21 U.S.C. section 360bbb-3(b)(1), unless the authorization is terminated or revoked.  Performed at [facility], [address], [place], [place] [address]   MRSA Next  Gen by PCR, Nasal     Status: None   Collection Time: 10/27/2020 10:16 PM   Specimen: Nasal Mucosa; Nasal Swab  Result Value Ref Range Status   MRSA by PCR Next Gen NOT DETECTED NOT DETECTED Final    Comment: (NOTE) The GeneXpert MRSA Assay (FDA approved for NASAL specimens only), is one component of a comprehensive MRSA colonization surveillance program. It is not intended to diagnose MRSA infection nor to guide or monitor treatment for MRSA infections. Test performance is not FDA approved in patients less than 49 years old. Performed at [facility], [place] [address] [place], [place] [address]          Radiology Studies: DG Chest 2 View  Result Date: 11/05/2020 CLINICAL DATA:  Shortness of breath EXAM: CHEST - 2 VIEW COMPARISON:  05/28/2015 FINDINGS: Heart size is normal. Chronic aortic  atherosclerosis. Patchy bilateral lower lobe pneumonia, left worse than right. No dense consolidation or lobar collapse. No visible effusion. No acute bone finding. IMPRESSION: Patchy infiltrates in both lower lobes, left more than right, consistent with bronchopneumonia. Electronically Signed   By: Nelson Chimes M.D.   On: 11/06/2020 15:20   CT Angio Chest Pulmonary Embolism (PE) W or WO Contrast  Result Date: 11/16/2020 CLINICAL DATA:  Worsening shortness of breath. EXAM: CT ANGIOGRAPHY CHEST WITH CONTRAST TECHNIQUE: Multidetector CT imaging of the chest was performed using the standard protocol during bolus administration of intravenous contrast. Multiplanar CT image reconstructions and MIPs were obtained to evaluate the vascular anatomy. CONTRAST:  24mL OMNIPAQUE IOHEXOL 350 MG/ML SOLN COMPARISON:  None. FINDINGS: Cardiovascular: There is marked severity calcification of the thoracic aorta. The ascending thoracic aorta measures 4.0 cm x 3.8 cm. The subsegmental pulmonary arteries within the bilateral lung bases are limited secondary to patient motion. No evidence of pulmonary embolism. Mild cardiomegaly with marked severity coronary artery calcification. No pericardial effusion. Mediastinum/Nodes: No enlarged mediastinal, hilar, or axillary lymph nodes. Thyroid gland, trachea, and esophagus demonstrate no significant findings. Lungs/Pleura: Mild atelectasis is seen along the posterior aspect of the bilateral lower lobes, right greater than left. Additional mild atelectatic changes are seen within the inferior aspect of the left upper lobe. There is no evidence of a pleural effusion or pneumothorax. Upper Abdomen: No acute abnormality. Musculoskeletal: Multilevel degenerative changes are seen throughout the thoracic spine. Review of the MIP images confirms the above findings. IMPRESSION: 1. No evidence of pulmonary embolism. 2. Mild bilateral lower lobe and left upper lobe atelectasis, right greater than  left. 3. Mild cardiomegaly with marked severity coronary artery calcification. Aortic Atherosclerosis (ICD10-I70.0). Electronically Signed   By: Virgina Norfolk M.D.   On: 11/16/2020 02:51     Scheduled Meds:  busPIRone  2.5 mg Oral BID   loratadine  10 mg Oral Daily   Continuous Infusions:  cefTRIAXone (ROCEPHIN)  IV     doxycycline (VIBRAMYCIN) IV 100 mg (11/16/20 0451)     LOS: 1 day   Time spent: 13min  Sidra Oldfield C Mariza Bourget, DO [facility]  If 7PM-7AM, please contact night-coverage [email or web address]  11/16/2020, 7:46 AM

## 2020-11-16 NOTE — Progress Notes (Signed)
  Echocardiogram 2D Echocardiogram has been performed.  Jamie Brown 11/16/2020, 12:20 PM

## 2020-11-16 NOTE — Progress Notes (Signed)
ANTICOAGULATION CONSULT NOTE - Initial Consult  Pharmacy Consult for Enoxaparin Indication: atrial fibrillation  Allergies  Allergen Reactions   Codeine Itching    Hives   Gabapentin Other (See Comments)    Difficulty finding words and foggy feeling   Isoptin [Verapamil] Itching   Remeron [Mirtazapine] Other (See Comments)    Muscle cramping and pain   Trazodone And Nefazodone Other (See Comments)    Mental confusion   Aldara [Imiquimod] Rash    Redness     Patient Measurements: Height: 5' 1.5" (156.2 cm) Weight: 76.4 kg (168 lb 6.9 oz) IBW/kg (Calculated) : 48.95   Vital Signs: Temp: 98.3 F (36.8 C) (10/25 1628) Temp Source: Oral (10/25 1628) BP: 126/70 (10/25 1641) Pulse Rate: 131 (10/25 1636)  Labs: Recent Labs    11/08/2020 1408 11/11/2020 1430 11/16/20 0037 11/16/20 0515  HGB 12.6  --  12.0  --   HCT 38.9  --  37.0  --   PLT 333  --  322  --   CREATININE 1.38*  --  1.21*  --   TROPONINIHS  --  109*  --  89*    Estimated Creatinine Clearance: 26.9 mL/min (A) (by C-G formula based on SCr of 1.21 mg/dL (H)).   Medical History: Past Medical History:  Diagnosis Date   Arthritis    OA   Coronary artery disease    GI bleeding 10/23/2016   High cholesterol    HTN (hypertension) 05/28/2015   Hypertension    Radiation 11/19/14-12/30/14   upper right neck 5000 cGy   Squamous cell carcinoma     Assessment: 41 y/oF admitted for sepsis secondary to CAP and now new afib with RVR. Pharmacy consulted for Enoxaparin dosing for atrial fibrillation. Patient not on anticoagulants PTA. SCr 1.21 with CrCl ~ 27 ml/min. H/H, Pltc WNL.   Plan:  Enoxaparin 1mg /kg SQ q24h  Monitor CBC, renal function, and for s/sx of bleeding   Lindell Spar, PharmD, BCPS Clinical Pharmacist  11/16/2020,5:40 PM

## 2020-11-16 NOTE — Progress Notes (Signed)
PHARMACIST - PHYSICIAN COMMUNICATION  CONCERNING: Antibiotic IV to Oral Route Change Policy  RECOMMENDATION: This patient is receiving doxycycline by the intravenous route.  Based on criteria approved by the Pharmacy and Therapeutics Committee, the antibiotic(s) is/are being converted to the equivalent oral dose form(s).   DESCRIPTION: These criteria include:  Patient being treated for a respiratory tract infection, urinary tract infection, cellulitis or clostridium difficile associated diarrhea if on metronidazole  The patient is not neutropenic and does not exhibit a GI malabsorption state  The patient is eating (either orally or via tube) and/or has been taking other orally administered medications for a least 24 hours  The patient is improving clinically and has a Tmax < 100.5  If you have questions about this conversion, please contact the Pharmacy Department  []  ( [phone] )  [place] []  ( [phone] )  [facility] []  ( [phone] )  [place] []  ( [phone] )  [facility] [x]  ( [phone] )  [facility]  

## 2020-11-17 DIAGNOSIS — J9601 Acute respiratory failure with hypoxia: Secondary | ICD-10-CM | POA: Diagnosis not present

## 2020-11-17 LAB — MYCOPLASMA PNEUMONIAE ANTIBODY, IGM: Mycoplasma pneumo IgM: <770 U/mL (ref 0–769)

## 2020-11-17 MED ORDER — DILTIAZEM HCL 30 MG PO TABS
30.0000 mg | ORAL_TABLET | Freq: Four times a day (QID) | ORAL | Status: DC
  Administered 2020-11-17 – 2020-11-19 (×7): 30 mg via ORAL
  Filled 2020-11-17 (×8): qty 1

## 2020-11-17 NOTE — Plan of Care (Signed)
  Problem: Education: Goal: Knowledge of General Education information will improve Description Including pain rating scale, medication(s)/side effects and non-pharmacologic comfort measures Outcome: Progressing   Problem: Health Behavior/Discharge Planning: Goal: Ability to manage health-related needs will improve Outcome: Progressing   

## 2020-11-17 NOTE — Progress Notes (Addendum)
PROGRESS NOTE    Jamie Brown  RPR:[id number] DOB: 02/20/1924 DOA: 11/22/2020 PCP: System, Provider Not In   Chief Complaint  Patient presents with   Shortness of Breath  Brief Narrative/Hospital Course:  Jamie Brown, 87 y.o. female with PMH of dementia, HTN/HLD/CAD s/p angioplasty 30 years ago generalized anxiety disorder, GI bleeding dementia who presented to [facility]ppage ED with 2 to 3 days of progressive shortness of breath new onset productive cough swelling in the lower extremities, generalized weakness. She was seen in the [facility] ED, febrile 100.6, tachypneic in 23-35 initially hypoxic 88% on room air, improved to 96-91% on 2 L, lab work showed BNP elevated 332 creatinine 1.3 previously 0.9 on 09/28/2020 elevated high-sensitivity troponin at 109, leukocytosis 18 K lactic acidosis 2.0 procalcitonin elevated 8.1 COVID-19 negative, EKG sinus rhythm with bifascicular block, chest x-ray with infiltrates in the bilateral left greater than right concerning for pneumonia. Patient was found to have severe sepsis with community-acquired pneumonia acute hypoxic respiratory failure and congestive heart failure new onset and admitted to [facility]. Patient was given IV antibiotics IV Lasix, cardiac status is DNR and admitted for further management. She also had new onset A. fib with RVR, and had rapid response on 10/25 evening-with worsening respiratory status hypoxia tachycardia. Attending discussed with patient's family at bedside and decision was made not to escalate the care and keep the patient on comfort measures palliative care approach and continue to optimize patient's heart rate with Cardizem drip, placed on IV morphine, supplemental oxygen needed up to 7 L.     Subjective: Seen this morning multiple family at the bedside.  Patient appears more alert awake and comfortable this morning.  Family reports morphine has helped significantly heart rate remains a stable junctional rhythm on Cardizem  drip.  Patient able to drink some she is very hard of hearing, not in distress.  Assessment & Plan:  Acute respiratory failure with hypoxia due to pneumonia/CHF: On supplemental oxygen wean slowly keeping on oxygen to make her comfortable  Severe sepsis  CAP: Met severe sepsis criteria with leukocytosis lactic acidosis, hypoxia and pneumonia.  Also had elevated procalcitonin consistent with pneumonia.  Managed with on ceftriaxone/azithromycin.  Blood culture no growth so far.  WBC at 14 K. Recent Labs  Lab 10/27/2020 1408 10/29/2020 1655 11/12/2020 2111 11/16/20 0037  WBC 18.0*  --   --  14.0*  LATICACIDVEN  --  2.0* 1.6  --   PROCALCITON  --  8.15  --   --     Acute CHF exacerbation W/ preserved LVEF: Echo shows EF 70 to 75% no R WMA mild concentric LVH could not evaluate diastolic function aortic and mitral valve degenerative/calcified with moderate MS.  Presentation consistent with CHF exacerbation with elevated BNP leg edema.  On IV Lasix to help with her dyspnea, goal is to keep her comfortable.  Elevated D-dimer- no PE on ct angi,  Moderate KM:[id number] to CHF/fluid issues  Elevated troponin CAD-Marked severity coronary artery calcification noted on CT scan HTN: Elevated troponin likely demand ischemia in the setting of hypoxia A. fib RVR.  Blood pressure is stable  New onset A fib/RVR/Junc rhythm/Tachycardia: Heart rate now improving on Cardizem drip we will wean down to oral Cardizem discontinue drip.  No anticoagulation given comfort measures  AKI: Creatinine 1.2 tolerating Lasix well keeping her on Lasix to help with her dyspnea Recent Labs  Lab 11/04/2020 1408 11/16/20 0037  BUN 28* 25*  CREATININE 1.38* 1.21*  Generalized weakness/debility Ambulatory dysfunction: Now planning for end-of-life care  Class I Obesity:Patient's Body mass index is 31.31 kg/m. : Will benefit with PCP follow-up, weight loss  healthy lifestyle and outpatient sleep evaluation.  GOC/EOL  care: Patient was under palliative care at the facility, admitted with multiple COVID condition given her advanced age DNR status family elected not to escalate care and keep her on comfort measures.  Palliative care has been consulted.  We will discontinue telemetry, labs, transition to oral meds, TOC consult  Lengthy discussion with patient's daughter granddaughter and other family medicine at the bedside. Family feels that patient is doing well on as needed morphine and appears much more comfortable today  DVT prophylaxis: SCDs Start: 11/09/2020 2002 Code Status:   Code Status: DNR Family Communication: plan of care discussed with patient's family at bedside. Status is: Inpatient  Remains inpatient appropriate because: For ongoing end-of-life care and further disposition.   Objective: Vitals last 24 hrs: Vitals:   11/16/20 1815 11/16/20 1828 11/16/20 1830 11/16/20 2139  BP: (!) 158/80 135/76 101/76 124/74  Pulse: (!) 128 (!) 158 97 66  Resp: (!) 34 (!) 34 (!) 34 (!) 25  Temp:    99.9 F (37.7 C)  TempSrc:    Axillary  SpO2: 94% 99% 98% (!) 88%  Weight:      Height:       Weight change:   Intake/Output Summary (Last 24 hours) at 11/17/2020 1612 Last data filed at 11/17/2020 1501 Gross per 24 hour  Intake 510.53 ml  Output --  Net 510.53 ml   Net IO Since Admission: 998.85 mL [11/17/20 1612]   Physical Examination: General exam: AA oriented to self people, elderly very hard of hearing HEENT:Oral mucosa moist, Ear/Nose WNL grossly,dentition normal. Respiratory system: B/l coarse breath sounds BS, no use of accessory muscle, non tender. Cardiovascular system: S1 & S2 +,No JVD. Gastrointestinal system: Abdomen soft, NT,ND, BS+. Nervous System:Alert, awake, moving extremities. Extremities: edema LE, distal peripheral pulses palpable.  Skin: No rashes, no icterus. MSK: Normal muscle bulk, tone, power.  Medications reviewed: Scheduled Meds:  busPIRone  2.5 mg Oral BID    diltiazem  30 mg Oral Q6H   doxycycline  100 mg Oral Q12H   furosemide  40 mg Intravenous BID   loratadine  10 mg Oral Daily   Continuous Infusions:  cefTRIAXone (ROCEPHIN)  IV 1 g (11/16/20 1409)    Diet Order             Diet regular Room service appropriate? Yes; Fluid consistency: Thin  Diet effective now                  Weight change:   Wt Readings from Last 3 Encounters:  11/16/20 76.4 kg  10/14/20 78 kg  09/28/20 78 kg     Consultants:see note  Procedures:see note Antimicrobials: Anti-infectives (From admission, onward)    Start     Dose/Rate Route Frequency Ordered Stop   11/16/20 1800  doxycycline (VIBRA-TABS) tablet 100 mg        100 mg Oral Every 12 hours 11/16/20 0944     11/16/20 1500  cefTRIAXone (ROCEPHIN) 1 g in sodium chloride 0.9 % 100 mL IVPB        1 g 200 mL/hr over 30 Minutes Intravenous Every 24 hours 11/18/2020 1959     11/16/20 0400  doxycycline (VIBRAMYCIN) 100 mg in sodium chloride 0.9 % 250 mL IVPB  Status:  Discontinued  100 mg 125 mL/hr over 120 Minutes Intravenous Every 12 hours 11/21/2020 1959 11/16/20 0944   11/05/2020 1600  doxycycline (VIBRA-TABS) tablet 100 mg        100 mg Oral  Once 10/30/2020 1553 10/30/2020 1608   10/25/2020 1600  cefTRIAXone (ROCEPHIN) 1 g in sodium chloride 0.9 % 100 mL IVPB        1 g 200 mL/hr over 30 Minutes Intravenous  Once 11/05/2020 1553 11/22/2020 1648      Culture/Microbiology    Component Value Date/Time   SDES  11/11/2020 2111    BLOOD RIGHT ARM Performed at [facility], [place] [address], [place], [place] [address]    SDES  11/09/2020 2111    BLOOD LEFT HAND Performed at [facility], [place] [address], [place], [place] [address]    [place]  11/04/2020 2111    BOTTLES DRAWN AEROBIC AND ANAEROBIC Blood Culture adequate volume Performed at [facility] [address], [place], [place] [address]    SPECREQUEST  11/05/2020 2111    BOTTLES  DRAWN AEROBIC ONLY Blood Culture adequate volume Performed at [facility] [address] [place], [place] [address]    CULT  11/19/2020 2111    NO GROWTH 1 DAY Performed at [facility] [address] [place], [place] [address]    CULT  11/01/2020 2111    NO GROWTH 1 DAY Performed at [facility] [address], [place], [place] [address]    REPTSTATUS PENDING 11/11/2020 2111   REPTSTATUS PENDING 11/05/2020 2111    Other culture-see note  Unresulted Labs (From admission, onward)     Start     Ordered   10/31/2020 2023  Methylmalonic acid, serum  Add-on,   AD       Question:  Specimen collection method  Answer:  Lab=Lab collect   11/07/2020 2022   11/11/2020 2021  Mycoplasma pneumoniae antibody, IgM  Add-on,   AD        11/02/2020 2020          Data Reviewed: I have personally reviewed following labs and imaging studies CBC: Recent Labs  Lab 11/11/2020 1408 11/16/20 0037  WBC 18.0* 14.0*  NEUTROABS  --  10.5*  HGB 12.6 12.0  HCT 38.9 37.0  MCV 85.9 87.3  PLT 333 599   Basic Metabolic Panel: Recent Labs  Lab 11/06/2020 1408 11/11/2020 1958 11/16/20 0037  NA 136  --  132*  K 4.5  --  3.7  CL 96*  --  96*  CO2 29  --  24  GLUCOSE 149*  --  202*  BUN 28*  --  25*  CREATININE 1.38*  --  1.21*  CALCIUM 10.1  --  8.9  MG  --  2.0 2.0  PHOS  --   --  3.4   GFR: Estimated Creatinine Clearance: 26.9 mL/min (A) (by C-G formula based on SCr of 1.21 mg/dL (H)). Liver Function Tests: Recent Labs  Lab 11/16/20 0037  AST 28  ALT 22  ALKPHOS 86  BILITOT 1.0  PROT 7.6  ALBUMIN 3.3*   No results for input(s): LIPASE, AMYLASE in the last 168 hours. No results for input(s): AMMONIA in the last 168 hours. Coagulation Profile: No results for input(s): INR, PROTIME in the last 168 hours. Cardiac Enzymes: No results for input(s): CKTOTAL, CKMB, CKMBINDEX, TROPONINI in the last 168 hours. BNP (last 3 results) No results for input(s): PROBNP  in the last 8760 hours. HbA1C: No results for input(s): HGBA1C in the last 72 hours. CBG: No results for input(s): GLUCAP in the last 168 hours. Lipid Profile: No results for input(s): CHOL, HDL, LDLCALC, TRIG, CHOLHDL, LDLDIRECT in the last 72 hours. Thyroid Function Tests: Recent Labs    11/14/2020 2111  TSH 1.158   Anemia Panel: No results for input(s): VITAMINB12, FOLATE, FERRITIN, TIBC, IRON, RETICCTPCT in the last 72 hours. Sepsis Labs: Recent Labs  Lab 11/14/2020 1655 10/30/2020 2111  PROCALCITON 8.15  --   LATICACIDVEN 2.0* 1.6    Recent Results (from the past 240 hour(s))  Resp Panel by RT-PCR (Flu A&B, Covid) Nasopharyngeal Swab     Status: None   Collection Time: 11/07/2020  2:12 PM   Specimen: Nasopharyngeal Swab; Nasopharyngeal(NP) swabs in vial transport medium  Result Value Ref Range Status   SARS Coronavirus 2 by RT PCR NEGATIVE NEGATIVE Final    Comment: (NOTE) SARS-CoV-2 target nucleic acids are NOT DETECTED.  The SARS-CoV-2 RNA is generally detectable in upper respiratory specimens during the acute phase of infection. The lowest concentration of SARS-CoV-2 viral copies this assay can detect is 138 copies/mL. A negative result does not preclude SARS-Cov-2 infection and should not be used as the sole basis for treatment or other patient management decisions. A negative result may occur with  improper specimen collection/handling, submission of specimen other than nasopharyngeal swab, presence of viral mutation(s) within the areas targeted by this assay, and inadequate number of viral copies(<138 copies/mL). A negative result must be combined with clinical observations, patient history, and epidemiological information. The expected result is Negative.  Fact Sheet for Patients:  EntrepreneurPulse.com.au  Fact Sheet for Healthcare Providers:  IncredibleEmployment.be  This test is no t yet approved or cleared by the [place] FDA and  has been authorized for detection and/or diagnosis of SARS-CoV-2 by FDA under an Emergency Use Authorization (EUA). This EUA will remain  in effect (meaning this test can be used) for the duration of the COVID-19 declaration under Section 564(b)(1) of the Act, 21 U.S.C.section 360bbb-3(b)(1), unless the authorization is terminated  or revoked sooner.       Influenza A by PCR NEGATIVE NEGATIVE Final   Influenza B by PCR NEGATIVE NEGATIVE Final    Comment: (NOTE) The Xpert Xpress SARS-CoV-2/FLU/RSV plus assay is intended as an aid in the diagnosis of influenza from Nasopharyngeal swab specimens and should not be used as a sole basis for treatment. Nasal washings and aspirates are unacceptable for Xpert Xpress SARS-CoV-2/FLU/RSV testing.  Fact Sheet for Patients: EntrepreneurPulse.com.au  Fact Sheet for Healthcare Providers: IncredibleEmployment.be  This test is not yet approved or cleared by the [place] FDA and has been authorized for detection and/or diagnosis of SARS-CoV-2 by FDA under an Emergency Use Authorization (EUA). This EUA will remain in effect (meaning this test can be used) for the duration of the COVID-19 declaration under Section 564(b)(1) of the Act, 21 U.S.C. section 360bbb-3(b)(1), unless the authorization is terminated or revoked.  Performed at [facility], [address] [place], [place] [address]   Culture, blood (Routine X 2) w Reflex to ID Panel     Status: None (Preliminary result)   Collection Time: 11/18/2020  9:11 PM   Specimen: BLOOD RIGHT ARM  Result Value Ref Range Status   Specimen Description   Final    BLOOD RIGHT ARM Performed at [facility], [place] [address] [place], [place] [address]    Special  Requests   Final    BOTTLES DRAWN AEROBIC AND ANAEROBIC Blood Culture adequate volume Performed at [facility] [address] [place], [place] [address]    Culture   Final    NO GROWTH 1 DAY Performed at [facility], [place] [address] [place], [place] [address]    Report Status PENDING  Incomplete  Culture, blood (Routine X 2) w Reflex to ID Panel     Status: None (Preliminary result)   Collection Time: 11/19/2020  9:11 PM   Specimen: BLOOD LEFT HAND  Result Value Ref Range Status   Specimen Description   Final    BLOOD LEFT HAND Performed at [facility] [address], [place], [place] [address]    Special Requests   Final    BOTTLES DRAWN AEROBIC ONLY Blood Culture adequate volume Performed at [facility] [address], [place], [place] [address]    Culture   Final    NO GROWTH 1 DAY Performed at [facility], [place] [address] [place], [place] [address]    Report Status PENDING  Incomplete  MRSA Next Gen by PCR, Nasal     Status: None   Collection Time: 10/30/2020 10:16 PM   Specimen: Nasal Mucosa; Nasal Swab  Result Value Ref Range Status   MRSA by PCR Next Gen NOT DETECTED NOT DETECTED Final    Comment: (NOTE) The GeneXpert MRSA Assay (FDA approved for NASAL specimens only), is one component of a comprehensive MRSA colonization surveillance program. It is not intended to diagnose MRSA infection nor to guide or monitor treatment for MRSA infections. Test performance is not FDA approved in patients less than 34 years old. Performed at [facility], [place] [address], [place], [place] [address]      Radiology Studies: CT Angio Chest Pulmonary Embolism (PE) W or WO Contrast  Result Date: 11/16/2020 CLINICAL DATA:  Worsening shortness of breath. EXAM: CT ANGIOGRAPHY CHEST WITH CONTRAST TECHNIQUE: Multidetector CT imaging of the chest was performed using the standard protocol during bolus administration of intravenous contrast. Multiplanar CT image reconstructions and MIPs were obtained to evaluate the vascular anatomy. CONTRAST:   70m OMNIPAQUE IOHEXOL 350 MG/ML SOLN COMPARISON:  None. FINDINGS: Cardiovascular: There is marked severity calcification of the thoracic aorta. The ascending thoracic aorta measures 4.0 cm x 3.8 cm. The subsegmental pulmonary arteries within the bilateral lung bases are limited secondary to patient motion. No evidence of pulmonary embolism. Mild cardiomegaly with marked severity coronary artery calcification. No pericardial effusion. Mediastinum/Nodes: No enlarged mediastinal, hilar, or axillary lymph nodes. Thyroid gland, trachea, and esophagus demonstrate no significant findings. Lungs/Pleura: Mild atelectasis is seen along the posterior aspect of the bilateral lower lobes, right greater than left. Additional mild atelectatic changes are seen within the inferior aspect of the left upper lobe. There is no evidence of a pleural effusion or pneumothorax. Upper Abdomen: No acute abnormality. Musculoskeletal: Multilevel degenerative changes are seen throughout the thoracic spine. Review of the MIP images confirms the above findings. IMPRESSION: 1. No evidence of pulmonary embolism. 2. Mild bilateral lower lobe and left upper lobe atelectasis, right greater than left. 3. Mild cardiomegaly with marked severity coronary artery calcification. Aortic Atherosclerosis (ICD10-I70.0). Electronically Signed   By: TVirgina NorfolkM.D.   On: 11/16/2020 02:51   ECHOCARDIOGRAM COMPLETE  Result Date: 11/16/2020    ECHOCARDIOGRAM REPORT   Patient Name:   NESHA FINCHERDate of Exam: 11/16/2020 Medical Rec #:  [id number]    Height:       61.5 in Accession #:    [id number]   Weight:       168.4 lb Date of Birth:  12/05/1924    BSA:          1.766 m Patient Age:    41 years     BP:           126/57 mmHg Patient Gender: F            HR:           91 bpm. Exam Location:  Inpatient Procedure: 2D Echo, Cardiac Doppler and Color Doppler Indications:    CHF  History:        Patient has no prior history of Echocardiogram examinations.                  CAD, Signs/Symptoms:Shortness of Breath and Dementia, sepsis,                 pneumonia; Risk Factors:Hypertension and Dyslipidemia.  Sonographer:    Dustin Flock RDCS Referring Phys: [id number] Rhetta Mura  Sonographer Comments: Technically challenging study due to limited acoustic windows. Patient confused. IMPRESSIONS  1. Left ventricular ejection fraction, by estimation, is 70 to 75%. The left ventricle has hyperdynamic function. The left ventricle has no regional wall motion abnormalities. There is mild concentric left ventricular hypertrophy. Left ventricular diastolic function could not be evaluated.  2. Right ventricular systolic function is normal. The right ventricular size is normal. Tricuspid regurgitation signal is inadequate for assessing PA pressure.  3. The mitral valve is degenerative. No evidence of mitral valve regurgitation. Moderate mitral stenosis. The mean mitral valve gradient is 9.0 mmHg with average heart rate of 89 bpm.  4. The aortic valve is calcified. Aortic valve regurgitation is mild. Mild to moderate aortic valve stenosis.  5. The inferior vena cava is normal in size with <50% respiratory variability, suggesting right atrial pressure of 8 mmHg. FINDINGS  Left Ventricle: Left ventricular ejection fraction, by estimation, is 70 to 75%. The left ventricle has hyperdynamic function. The left ventricle has no regional wall motion abnormalities. The left ventricular internal cavity size was small. There is mild concentric left ventricular hypertrophy. Left ventricular diastolic function could not be evaluated due to mitral annular calcification (moderate or greater). Left ventricular diastolic function could not be evaluated. Right Ventricle: The right ventricular size is normal. No increase in right ventricular wall thickness. Right ventricular systolic function is normal. Tricuspid regurgitation signal is inadequate for assessing PA pressure. Left Atrium: Left atrial  size was normal in size. Right Atrium: Right atrial size was normal in size. Pericardium: Trivial pericardial effusion is present. Presence of pericardial fat pad. Mitral Valve: The mitral valve is degenerative in appearance. No evidence of mitral valve regurgitation. Moderate mitral valve stenosis. MV peak gradient, 22.1 mmHg. The mean mitral valve gradient is 9.0 mmHg with average heart rate of 89 bpm. Tricuspid Valve: The tricuspid valve is grossly normal. Tricuspid valve regurgitation is trivial. No evidence of tricuspid stenosis. Aortic Valve: The aortic valve is calcified. Aortic valve regurgitation is mild. Aortic regurgitation PHT measures 305 msec. Mild to moderate aortic stenosis is present. Aortic valve mean gradient measures 17.0 mmHg. Aortic valve peak gradient measures 26.3 mmHg. Aortic valve area, by VTI measures 1.72 cm. Pulmonic Valve: The pulmonic valve was grossly normal. Pulmonic valve regurgitation is not visualized. No evidence of pulmonic stenosis. Aorta: The aortic root is normal  in size and structure. Venous: The inferior vena cava is normal in size with less than 50% respiratory variability, suggesting right atrial pressure of 8 mmHg. IAS/Shunts: The atrial septum is grossly normal.  LEFT VENTRICLE PLAX 2D LVIDd:         3.00 cm   Diastology LVIDs:         2.00 cm   LV e' medial:    4.57 cm/s LV PW:         1.20 cm   LV E/e' medial:  40.9 LV IVS:        1.30 cm   LV e' lateral:   5.55 cm/s LVOT diam:     2.00 cm   LV E/e' lateral: 33.7 LV SV:         87 LV SV Index:   49 LVOT Area:     3.14 cm  RIGHT VENTRICLE RV Basal diam:  2.60 cm RV S prime:     11.50 cm/s TAPSE (M-mode): 1.8 cm LEFT ATRIUM             Index        RIGHT ATRIUM           Index LA diam:        3.40 cm 1.92 cm/m   RA Area:     14.00 cm LA Vol (A2C):   37.4 ml 21.17 ml/m  RA Volume:   32.40 ml  18.34 ml/m LA Vol (A4C):   40.4 ml 22.87 ml/m LA Biplane Vol: 40.4 ml 22.87 ml/m  AORTIC VALVE AV Area (Vmax):    1.32 cm  AV Area (Vmean):   1.12 cm AV Area (VTI):     1.72 cm AV Vmax:           256.50 cm/s AV Vmean:          200.000 cm/s AV VTI:            0.503 m AV Peak Grad:      26.3 mmHg AV Mean Grad:      17.0 mmHg LVOT Vmax:         108.00 cm/s LVOT Vmean:        71.600 cm/s LVOT VTI:          0.276 m LVOT/AV VTI ratio: 0.55 AI PHT:            305 msec  AORTA Ao Root diam: 2.90 cm MITRAL VALVE MV Area (PHT): 2.78 cm     SHUNTS MV Area VTI:   1.77 cm     Systemic VTI:  0.28 m MV Peak grad:  22.1 mmHg    Systemic Diam: 2.00 cm MV Mean grad:  9.0 mmHg MV Vmax:       2.35 m/s MV Vmean:      138.0 cm/s MV VTI:        0.49 m MV Decel Time: 273 msec MV E velocity: 187.00 cm/s MV A velocity: 200.00 cm/s MV E/A ratio:  0.94 Eleonore Chiquito MD Electronically signed by Eleonore Chiquito MD Signature Date/Time: 11/16/2020/1:18:41 PM    Final      LOS: 2 days   Antonieta Pert, MD [facility]  11/17/2020, 4:12 PM

## 2020-11-17 NOTE — TOC Initial Note (Addendum)
Transition of Care [facility]) - Initial/Assessment Note    Patient Details  Name: AVERI KILTY MRN: [id number] Date of Birth: 05-16-1924  Transition of Care [facility]) CM/SW Contact:    Leeroy Cha, RN Phone Number: 11/17/2020, 9:50 AM  Clinical Narrative:                 87 y.o. female with medical history significant for dementia, coronary disease status post angioplasty approximately 30 years ago, generalized anxiety disorder, allergic rhinitis, who is admitted to [facility] on 11/04/2020 by way of transfer from [facility] emergency department with suspected severe sepsis due to community-acquired pneumonia with potential additional element of acutely decompensated heart failure with preserved EF after presenting from home to [facility] ED complaining of shortness of breath. Found to have CAP based on imaging and hypoxia meeting sepsis criteria as well as questionable heart failure exacerbation.   **Evening update, rapid response note Patient unfortunately had worsening respiratory status tachycardia and tachypnea late this afternoon, EKG shows new onset provoked A. fib with increasing RVR, p.o. diltiazem and IV diltiazem push more minimally effective, diltiazem drip initiated.  Unfortunately patient's respiratory status continued to worsen.  Rapid response was called, patient's daughter and son were at bedside.  Lengthy discussion about escalation of care, patient would not tolerate BiPAP as such will not transfer to the ICU for closer monitoring, transitioning to comfort measures with IV morphine 2 mg every hour, patient tolerated quite well done rapid response, metoprolol 5 mg push was also utilized to decrease patient's RVR, much more controlled now around 100 to 120 bpm, patient remains somewhat tachypneic to the 20s and 30s but is no longer hypoxic or in respiratory distress.  If patient continues to worsen overnight we discussed initiation of morphine drip which family was agreeable to.  TOC  PLAN OF CARE: Following for progression due to pm rapid response and iv cardizem drip. Pt has recommended snf  rehab for 3 hr per day at dc will work up for snf placement. Pt placed on comfort measures will hold off on snf placement. Expected Discharge Plan: Skilled Nursing Facility Barriers to Discharge: Continued Medical Work up   Patient Goals and CMS Choice Patient states their goals for this hospitalization and ongoing recovery are:: i would like to go home to [facility] CMS Medicare.gov Compare Post Acute Care list provided to:: Patient    Expected Discharge Plan and Services Expected Discharge Plan: [place]   Discharge Planning Services: CM Consult Post Acute Care Choice: [place] Living arrangements for the past 2 months: [place]                                      Prior Living Arrangements/Services Living arrangements for the past 2 months: [place] Lives with:: Facility Resident Patient language and need for interpreter reviewed:: Yes Do you feel safe going back to the place where you live?: Yes            Criminal Activity/Legal Involvement Pertinent to Current Situation/Hospitalization: No - Comment as needed  Activities of Daily Living Home Assistive Devices/Equipment: Environmental consultant (specify type), Hearing aid (bilateral hearing aides) ADL Screening (condition at time of admission) Patient's cognitive ability adequate to safely complete daily activities?: No Is the patient deaf or have difficulty hearing?: Yes (wears bilateral hearing aides) Does the patient have difficulty seeing, even when wearing glasses/contacts?:  Yes Does the patient have difficulty concentrating, remembering, or making decisions?: Yes Patient able to express need for assistance with ADLs?: No Does the patient have difficulty dressing or bathing?: Yes Independently performs ADLs?: No Communication: Independent Dressing  (OT): Needs assistance Is this a change from baseline?: Pre-admission baseline Grooming: Needs assistance Is this a change from baseline?: Pre-admission baseline Feeding: Independent Bathing: Needs assistance Is this a change from baseline?: Pre-admission baseline Toileting: Needs assistance Is this a change from baseline?: Pre-admission baseline In/Out Bed: Needs assistance Is this a change from baseline?: Pre-admission baseline Walks in Home: Needs assistance Is this a change from baseline?: Pre-admission baseline Does the patient have difficulty walking or climbing stairs?: Yes Weakness of Legs: Both Weakness of Arms/Hands: Both  Permission Sought/Granted                  Emotional Assessment Appearance:: Appears stated age Attitude/Demeanor/Rapport: Engaged Affect (typically observed): Calm Orientation: : Oriented to Place, Oriented to Self, Oriented to  Time, Oriented to Situation Alcohol / Substance Use: Not Applicable Psych Involvement: No (comment)  Admission diagnosis:  Elevated troponin [R77.8] Acute respiratory failure with hypoxia ([place]) [J96.01] Community acquired pneumonia, unspecified laterality [J18.9] Patient Active Problem List   Diagnosis Date Noted   Severe sepsis ([place]) 11/16/2020   AKI (acute kidney injury) ([place]) 11/16/2020   Generalized weakness 11/16/2020   Elevated troponin 11/16/2020   Acute respiratory failure with hypoxia ([place]) 10/24/2020   Acute CHF (congestive heart failure) ([place]) 11/22/2020   CAP (community acquired pneumonia) 11/01/2020   Obesity (BMI 30-39.9) 11/10/2020   Acute upper GI bleed 10/22/2016   Anemia 10/22/2016   Coronary artery disease 10/22/2016   Dens fracture ([place]) 10/22/2016   Hypokalemia 10/22/2016   Acute GI bleeding 10/22/2016   Sepsis ([place]) 05/28/2015   UTI (lower urinary tract infection) 05/28/2015   HLD (hyperlipidemia) 05/28/2015   HTN (hypertension) 05/28/2015   Lactic acidosis 05/28/2015   Insomnia  05/28/2015   Squamous cell carcinoma of skin of neck 10/29/2014   PCP:  System, Provider Not In Pharmacy:   [facility], [place] - [address] [place] [address] Phone: [phone] Fax: [phone]  [facility], [place] AT Portal to Registered [employer] Sites [place] [address] Phone: [phone] Fax: [phone]  [id number] [place], [place] - [address] [place] [address] Phone: [phone] Fax: [phone]     Social Determinants of Health (SDOH) Interventions    Readmission Risk Interventions No flowsheet data found.

## 2020-11-17 NOTE — NC FL2 (Signed)
[place] MEDICAID FL2 LEVEL OF CARE SCREENING TOOL     IDENTIFICATION  Patient Name: Jamie Brown Birthdate: 03-05-1924 Sex: female Admission Date (Current Location): 11/05/2020  [facility] and [place] Number:  Herbalist and Address:  [facility],  [place], [place]      Provider Number: [id number]  Attending Physician Name and Address:  Antonieta Pert, MD  Relative Name and Phone Number:       Current Level of Care: Hospital Recommended Level of Care: [place] Prior Approval Number:    Date Approved/Denied:   PASRR Number: [id number]  Discharge Plan: SNF    Current Diagnoses: Patient Active Problem List   Diagnosis Date Noted   Severe sepsis ([place]) 11/16/2020   AKI (acute kidney injury) ([place]) 11/16/2020   Generalized weakness 11/16/2020   Elevated troponin 11/16/2020   Acute respiratory failure with hypoxia ([place]) 11/18/2020   Acute CHF (congestive heart failure) ([place]) 11/10/2020   CAP (community acquired pneumonia) 10/23/2020   Obesity (BMI 30-39.9) 10/23/2020   Acute upper GI bleed 10/22/2016   Anemia 10/22/2016   Coronary artery disease 10/22/2016   Dens fracture ([place]) 10/22/2016   Hypokalemia 10/22/2016   Acute GI bleeding 10/22/2016   Sepsis ([place]) 05/28/2015   UTI (lower urinary tract infection) 05/28/2015   HLD (hyperlipidemia) 05/28/2015   HTN (hypertension) 05/28/2015   Lactic acidosis 05/28/2015   Insomnia 05/28/2015   Squamous cell carcinoma of skin of neck 10/29/2014    Orientation RESPIRATION BLADDER Height & Weight     Self, Time, Situation, Place  Normal Continent Weight: 76.4 kg Height:  5' 1.5" (156.2 cm)  BEHAVIORAL SYMPTOMS/MOOD NEUROLOGICAL BOWEL NUTRITION STATUS      Continent Diet (regular)  AMBULATORY STATUS COMMUNICATION OF NEEDS Skin   Extensive Assist Verbally Normal                       Personal Care Assistance Level of Assistance  Bathing, Feeding, Dressing Bathing  Assistance: Limited assistance Feeding assistance: Limited assistance Dressing Assistance: Limited assistance     Functional Limitations Info  Sight, Hearing, Speech Sight Info: Adequate Hearing Info: Adequate Speech Info: Adequate    SPECIAL CARE FACTORS FREQUENCY  PT (By licensed PT), OT (By licensed OT)     PT Frequency: 5 x weekly OT Frequency: 5 x weekly            Contractures Contractures Info: Present    Additional Factors Info  Code Status Code Status Info: DNR             Current Medications (11/17/2020):  This is the current hospital active medication list Current Facility-Administered Medications  Medication Dose Route Frequency Provider Last Rate Last Admin   acetaminophen (TYLENOL) tablet 650 mg  650 mg Oral Q6H PRN Howerter, Justin B, DO   650 mg at 11/16/20 0948   Or   acetaminophen (TYLENOL) suppository 650 mg  650 mg Rectal Q6H PRN Howerter, Justin B, DO       albuterol (PROVENTIL) (2.5 MG/3ML) 0.083% nebulizer solution 2.5 mg  2.5 mg Nebulization Q4H PRN Howerter, Justin B, DO   2.5 mg at 11/16/20 1821   busPIRone (BUSPAR) tablet 2.5 mg  2.5 mg Oral BID Howerter, Justin B, DO   2.5 mg at 11/16/20 2128   cefTRIAXone (ROCEPHIN) 1 g in sodium chloride 0.9 % 100 mL IVPB  1 g Intravenous Q24H Howerter, Justin B, DO 200 mL/hr at 11/16/20 1409 1  g at 11/16/20 1409   diltiazem (CARDIZEM) 125 mg in dextrose 5% 125 mL (1 mg/mL) infusion  5-15 mg/hr Intravenous Titrated Little Ishikawa, MD 5 mL/hr at 11/17/20 0603 5 mg/hr at 11/17/20 0603   doxycycline (VIBRA-TABS) tablet 100 mg  100 mg Oral Q12H Eudelia Bunch, RPH   100 mg at 11/16/20 1741   enoxaparin (LOVENOX) injection 80 mg  80 mg Subcutaneous Q24H Luiz Ochoa, RPH   80 mg at 11/16/20 1745   furosemide (LASIX) injection 40 mg  40 mg Intravenous BID Little Ishikawa, MD   40 mg at 11/17/20 0100   lip balm (CARMEX) ointment   Topical PRN Little Ishikawa, MD   75 application at 71/21/97  0949   loratadine (CLARITIN) tablet 10 mg  10 mg Oral Daily Howerter, Justin B, DO   10 mg at 11/16/20 5883   morphine 2 MG/ML injection 2 mg  2 mg Intravenous Q1H PRN Little Ishikawa, MD   2 mg at 11/17/20 2549   zolpidem (AMBIEN) tablet 5 mg  5 mg Oral QHS PRN Howerter, Justin B, DO   5 mg at 11/16/20 2128     Discharge Medications: Please see discharge summary for a list of discharge medications.  Relevant Imaging Results:  Relevant Lab Results:   Additional Information SSN: [id number]  Leeroy Cha, RN

## 2020-11-17 NOTE — Plan of Care (Signed)
  Problem: Pain Managment: Goal: General experience of comfort will improve Outcome: Progressing   Problem: Safety: Goal: Ability to remain free from injury will improve Outcome: Progressing   Problem: Skin Integrity: Goal: Risk for impaired skin integrity will decrease Outcome: Progressing   

## 2020-11-18 DIAGNOSIS — Z7189 Other specified counseling: Secondary | ICD-10-CM

## 2020-11-18 DIAGNOSIS — R531 Weakness: Secondary | ICD-10-CM

## 2020-11-18 DIAGNOSIS — I5031 Acute diastolic (congestive) heart failure: Secondary | ICD-10-CM | POA: Diagnosis not present

## 2020-11-18 DIAGNOSIS — J189 Pneumonia, unspecified organism: Secondary | ICD-10-CM | POA: Diagnosis not present

## 2020-11-18 DIAGNOSIS — Z515 Encounter for palliative care: Secondary | ICD-10-CM | POA: Diagnosis not present

## 2020-11-18 DIAGNOSIS — I10 Essential (primary) hypertension: Secondary | ICD-10-CM

## 2020-11-18 DIAGNOSIS — N179 Acute kidney failure, unspecified: Secondary | ICD-10-CM | POA: Diagnosis not present

## 2020-11-18 DIAGNOSIS — J9601 Acute respiratory failure with hypoxia: Secondary | ICD-10-CM

## 2020-11-18 MED ORDER — MORPHINE SULFATE (CONCENTRATE) 10 MG/0.5ML PO SOLN
5.0000 mg | ORAL | Status: DC
  Administered 2020-11-18 – 2020-11-19 (×5): 5 mg via ORAL
  Filled 2020-11-18 (×6): qty 0.5

## 2020-11-18 MED ORDER — HALOPERIDOL LACTATE 5 MG/ML IJ SOLN
1.0000 mg | Freq: Four times a day (QID) | INTRAMUSCULAR | Status: DC | PRN
  Administered 2020-11-18 – 2020-11-20 (×2): 1 mg via INTRAVENOUS
  Filled 2020-11-18 (×2): qty 1

## 2020-11-18 MED ORDER — ALBUTEROL SULFATE (2.5 MG/3ML) 0.083% IN NEBU
2.5000 mg | INHALATION_SOLUTION | Freq: Three times a day (TID) | RESPIRATORY_TRACT | Status: DC
  Filled 2020-11-18 (×2): qty 3

## 2020-11-18 MED ORDER — GLYCOPYRROLATE 0.2 MG/ML IJ SOLN
0.2000 mg | Freq: Four times a day (QID) | INTRAMUSCULAR | Status: DC | PRN
  Filled 2020-11-18: qty 1

## 2020-11-18 MED ORDER — FUROSEMIDE 10 MG/ML IJ SOLN
20.0000 mg | Freq: Once | INTRAMUSCULAR | Status: AC
  Administered 2020-11-18: 20 mg via INTRAVENOUS
  Filled 2020-11-18: qty 2

## 2020-11-18 NOTE — Progress Notes (Addendum)
Manufacturing engineer [facility]) Hospital Liaison note.    Received request from [place] for family interest in [facility]. Chart and pt information under review by [facility] physician.  Hospice eligibility pending at this time.  [place] is unable to offer a room today. Hospital Liaison will follow up tomorrow or sooner if a room becomes available. Please do not hesitate to call with questions.    FYI: family would like staff to address patient with the term comfort measures instead of using hospice. Once transferred to BP also.   Thank you for the opportunity to participate in this patient's care.  Domenic Moras, BSN, RN [facility] Liaison (listed on [place] under Hospice/[facility])    [phone] (24h on call)

## 2020-11-18 NOTE — Consult Note (Signed)
Consultation Note Date: 11/18/2020   Patient Name: Jamie Brown  DOB: 1924/03/27  MRN: [id number]  Age / Sex: 87 y.o., female  PCP: System, Provider Not In Referring Physician: Eugenie Filler, MD  Reason for Consultation: Establishing goals of care, Hospice Evaluation, Pain control, and Terminal Care  HPI/Patient Profile: 87 y.o. female  admitted on 11/10/2020 with  .  87 year old lady has been at [facility] for about 4 years now.  Has dementia, hypertension dyslipidemia coronary artery disease status post angioplasty 30 years ago.  Also has history of generalized anxiety disorder GI bleeding.  Admitted to the hospital with progressive shortness of breath, swelling in lower extremities and generalized weakness.  Has been followed by palliative services at her facility.  Clinical Assessment and Goals of Care: Hospital course complicated by ongoing symptom burden with shortness of breath and anxiety complicated by agitation and air hunger.  Patient remains admitted to hospital medicine service for community-acquired pneumonia, met criteria for sepsis, has hypoxic respiratory failure because of pneumonia as well as CHF component, acute heart failure exacerbation with preserved left ventricular ejection fraction, has diastolic dysfunction, has aortic and mitral valve degeneration/calcification with moderate mitral stenosis.  A palliative consultation has been requested for ongoing goals of care discussions.  Patient is awake alert resting in bed.  Granddaughter present at bedside.  Patient's daughter Izora Gala arrived and a family meeting was held with several family members.  Palliative medicine is specialized medical care for people living with serious illness. It focuses on providing relief from the symptoms and stress of a serious illness. The goal is to improve quality of life for both the patient and the  family. Goals of care: Broad aims of medical therapy in relation to the patient's values and preferences. Our aim is to provide medical care aimed at enabling patients to achieve the goals that matter most to them, given the circumstances of their particular medical situation and their constraints.   Brief life review performed.  [place] and is described as a hard-working individual.  She has family arriving from out of state and is looking forward to their visits.  She has had gradual progressive decline lately.  Broad goals of care are to focus exclusively on symptom management as well as to consider first and foremost a comfort focused mode of care.  Patient has required at least 20 mg of IV morphine since the last 24 hours.  She has had escalating symptom burden.  We discussed about appropriate disposition options.  Hospice philosophy of care introduced.  HCPOA Advance care planning documents reviewed in [place]: Patient has healthcare power of attorney documents and MOST form.   SUMMARY OF RECOMMENDATIONS    DNR DNI Focus on comfort measures Add scheduled low dose Po morphine and monitor Disposition: options between back to [facility] with hospice ( has been followed at [facility] by [facility] palliative) versus residential hospice at [facility] are being explored, based on hospital course and disease trajectory of illness.  Anticipate decision making to be completed within the next 24 hours or so.  Repeat family meeting has been scheduled for 11-19-2020 at 9:30 AM to re discuss.  PMT to follow.  Thank you for the consult.    Code Status/Advance Care Planning: DNR   Symptom Management:     Palliative Prophylaxis:  Delirium Protocol  Additional Recommendations (Limitations, Scope, Preferences): Full Comfort Care  Psycho-social/Spiritual:  Desire for further Chaplaincy support:yes Additional Recommendations: Education on Hospice  Prognosis:  < 2  weeks  Discharge Planning: To Be Determined      Primary Diagnoses: Present on Admission:  Acute respiratory failure with hypoxia ([place])  Coronary artery disease  HTN (hypertension)  CAP (community acquired pneumonia)  Obesity (BMI 30-39.9)  Severe sepsis (HCC)  AKI (acute kidney injury) ([place])  Elevated troponin   I have reviewed the medical record, interviewed the patient and family, and examined the patient. The following aspects are pertinent.  Past Medical History:  Diagnosis Date   Arthritis    OA   Coronary artery disease    GI bleeding 10/23/2016   High cholesterol    HTN (hypertension) 05/28/2015   Hypertension    Radiation 11/19/14-12/30/14   upper right neck 5000 cGy   Squamous cell carcinoma    Social History   Socioeconomic History   Marital status: Widowed    Spouse name: Not on file   Number of children: Not on file   Years of education: Not on file   Highest education level: Not on file  Occupational History   Not on file  Tobacco Use   Smoking status: Never    Passive exposure: Past   Smokeless tobacco: Never  Vaping Use   Vaping Use: Never used  Substance and Sexual Activity   Alcohol use: No   Drug use: No   Sexual activity: Never  Other Topics Concern   Not on file  Social History Narrative   Not on file   Social Determinants of Health   Financial Resource Strain: Not on file  Food Insecurity: Not on file  Transportation Needs: Not on file  Physical Activity: Not on file  Stress: Not on file  Social Connections: Not on file   History reviewed. No pertinent family history. Scheduled Meds:  busPIRone  2.5 mg Oral BID   diltiazem  30 mg Oral Q6H   doxycycline  100 mg Oral Q12H   furosemide  40 mg Intravenous BID   loratadine  10 mg Oral Daily   morphine CONCENTRATE  5 mg Oral Q4H   Continuous Infusions:  cefTRIAXone (ROCEPHIN)  IV 1 g (11/16/20 1409)   PRN Meds:.acetaminophen **OR** acetaminophen, albuterol, glycopyrrolate,  haloperidol lactate, lip balm, morphine injection, zolpidem Medications Prior to Admission:  Prior to Admission medications   Medication Sig Start Date End Date Taking? Authorizing Provider  acetaminophen (TYLENOL) 500 MG tablet Take 500 mg by mouth 3 (three) times daily.   Yes [provider]  albuterol (VENTOLIN HFA) 108 (90 Base) MCG/ACT inhaler Inhale 2 puffs into the lungs 2 (two) times daily.   Yes [provider]  busPIRone (BUSPAR) 5 MG tablet Take 2.5 mg by mouth 2 (two) times daily.   Yes [provider]  cetirizine (ZYRTEC) 10 MG tablet Take 10 mg by mouth daily.   Yes [provider]  Cholecalciferol (VITAMIN D3) 50 MCG (2000 UT) capsule Take 4,000 Units by mouth daily.   Yes [provider]  Cranberry 425 MG CAPS Take 425  mg by mouth daily.   Yes [provider]  Cyanocobalamin (VITAMIN B 12 PO) Take 1,000 mcg by mouth daily.   Yes [provider]  GOODSENSE ARTIFICIAL TEARS 0.5-0.6 % SOLN Place 1 drop into both eyes in the morning, at noon, and at bedtime. 10/26/20  Yes [provider]  iron polysaccharides (NIFEREX) 150 MG capsule Take 150 mg by mouth 2 (two) times daily.   Yes [provider]  lactobacillus acidophilus (BACID) TABS tablet Take 1 tablet by mouth 2 (two) times daily.   Yes [provider]  latanoprost (XALATAN) 0.005 % ophthalmic solution Place 1 drop into both eyes daily. 10/20/20  Yes [provider]  Melatonin 10 MG TABS Take 10 mg by mouth at bedtime.   Yes [provider]  Mineral Oil Heavy OIL Place 2 drops into both ears once a week. Wednesdays 08/16/20  Yes [provider]  MURO 128 5 % ophthalmic ointment Place 1 application into both eyes at bedtime. Place a small amount of ointment into the pocket made by the lower eye. 10/29/20  Yes [provider]  NIFEdipine (PROCARDIA XL/NIFEDICAL XL) 60 MG 24 hr tablet Take 60 mg by mouth daily.  11/05/20  Yes [provider]  nystatin (MYCOSTATIN/NYSTOP) powder Apply 1 application topically in the morning and at bedtime. Apply powder to the groin area two time daily until healed 10/25/20  Yes [provider]  pantoprazole (PROTONIX) 40 MG tablet Take 40 mg by mouth 2 (two) times daily. 10/25/20  Yes [provider]  polyethylene glycol powder (GLYCOLAX/MIRALAX) 17 GM/SCOOP powder Take 17 g by mouth daily. 10/19/20  Yes [provider]  potassium chloride (MICRO-K) 10 MEQ CR capsule Take 10 mEq by mouth daily. 11/05/20  Yes [provider]  sertraline (ZOLOFT) 50 MG tablet Take 75 mg by mouth daily. 11/05/20  Yes [provider]  torsemide (DEMADEX) 20 MG tablet Take 20 mg by mouth daily.   Yes [provider]  traMADol (ULTRAM) 50 MG tablet Take 25 mg by mouth 2 (two) times daily.   Yes [provider]  zolpidem (AMBIEN) 10 MG tablet Take 10 mg by mouth at bedtime.   Yes [provider]   Allergies  Allergen Reactions   Codeine Itching    Hives   Gabapentin Other (See Comments)    Difficulty finding words and foggy feeling   Isoptin [Verapamil] Itching   Remeron [Mirtazapine] Other (See Comments)    Muscle cramping and pain   Trazodone And Nefazodone Other (See Comments)    Mental confusion   Aldara [Imiquimod] Rash    Redness    Review of Systems Shortness of breath.   Physical Exam Awake alert Not oriented Hard of hearing Coarse breath sounds S 1 S 2  Has some edema No distress Abdomen not distended  Vital Signs: BP (!) 136/59 (BP Location: Right Arm)   Pulse 78   Temp 98.4 F (36.9 C) (Oral)   Resp (!) 25   Ht 5' 1.5" (1.562 m)   Wt 76.4 kg   SpO2 94%   BMI 31.31 kg/m  Pain Scale: PAINAD   Pain Score: 0-No pain   SpO2: SpO2: 94 % O2 Device:SpO2: 94 % O2 Flow Rate: .O2 Flow Rate (L/min): 10 L/min  IO: Intake/output summary:  Intake/Output Summary (Last 24 hours) at  11/18/2020 0926 Last data filed at 11/17/2020 2100 Gross per 24 hour  Intake 662.51 ml  Output 775 ml  Net -  112.49 ml    LBM: Last BM Date:  (unknown) Baseline Weight: Weight: 73.9 kg Most recent weight: Weight: 76.4 kg     Palliative Assessment/Data:   PPS 30%  Time In:  8.30 Time Out:  9.30 Time Total:  60  Greater than 50%  of this time was spent counseling and coordinating care related to the above assessment and plan.  Signed by: Loistine Chance, MD   Please contact Palliative Medicine Team phone at [phone] for questions and concerns.  For individual provider: See Shea Evans

## 2020-11-18 NOTE — TOC Progression Note (Signed)
Transition of Care [facility]) - Progression Note    Patient Details  Name: HILDRETH ORSAK MRN: [id number] Date of Birth: [birth year]  Transition of Care [facility]) CM/SW Contact  Leeroy Cha, RN Phone Number: 11/18/2020, 10:57 AM  Clinical Narrative:    Spoke with daughter and family. After talking they do want hospice care at [facility].  Chrislyn king notified.   Expected Discharge Plan: [place] Barriers to Discharge: Continued Medical Work up  Expected Discharge Plan and Services Expected Discharge Plan: [place]   Discharge Planning Services: CM Consult Post Acute Care Choice: [place] Living arrangements for the past 2 months: [place]                                       Social Determinants of Health (SDOH) Interventions    Readmission Risk Interventions No flowsheet data found.

## 2020-11-18 NOTE — Progress Notes (Signed)
PROGRESS NOTE    Jamie Brown  NWG:[id number] DOB: September 12, 1924 DOA: 11/01/2020 PCP: System, Provider Not In    Chief Complaint  Patient presents with   Shortness of Breath    Brief Narrative: Jamie Brown, 87 y.o. female with PMH of dementia, HTN/HLD/CAD s/p angioplasty 30 years ago generalized anxiety disorder, GI bleeding dementia who presented to [facility] ED with 2 to 3 days of progressive shortness of breath new onset productive cough swelling in the lower extremities, generalized weakness. She was seen in the [facility] ED, febrile 100.6, tachypneic in 23-35 initially hypoxic 88% on room air, improved to 96-91% on 2 L, lab work showed BNP elevated 332 creatinine 1.3 previously 0.9 on 09/28/2020 elevated high-sensitivity troponin at 109, leukocytosis 18 K lactic acidosis 2.0 procalcitonin elevated 8.1 COVID-19 negative, EKG sinus rhythm with bifascicular block, chest x-ray with infiltrates in the bilateral left greater than right concerning for pneumonia. Patient was found to have severe sepsis with community-acquired pneumonia acute hypoxic respiratory failure and congestive heart failure new onset and admitted to [facility]. Patient was given IV antibiotics IV Lasix, cardiac status is DNR and admitted for further management. She also had new onset A. fib with RVR, and had rapid response on 10/25 evening-with worsening respiratory status hypoxia tachycardia. Attending discussed with patient's family at bedside and decision was made not to escalate the care and keep the patient on comfort measures palliative care approach and continue to optimize patient's heart rate with Cardizem drip, placed on IV morphine, supplemental oxygen needed up to 7 L.  Reassessed by palliative care and decision for likely residential hospice home on discharge.  Assessment & Plan:   Principal Problem:   Acute respiratory failure with hypoxia (HCC) Active Problems:   HTN (hypertension)   Coronary artery  disease   Acute CHF (congestive heart failure) (HCC)   CAP (community acquired pneumonia)   Obesity (BMI 30-39.9)   Severe sepsis (HCC)   AKI (acute kidney injury) (Opp)   Generalized weakness   Elevated troponin  #1 acute respiratory failure with hypoxia secondary to acute CHF exacerbation/pneumonia -Patient was on IV antibiotics however states these have subsequently been discontinued as patient now comfort measures. -Patient currently on IV Lasix with urine output of 775 cc over the past 24 hours. -Events overnight noted with some improvement after morphine and IV Lasix. -Patient now full comfort care and placed on oral morphine as needed shortness of breath and air hunger. -Family have decided on residential hospice home.  2.  Severe sepsis/CAP -On admission patient met criteria for sepsis with leukocytosis, lactic acidosis, hypoxia, concern for pneumonia, elevated procalcitonin consistent with pneumonia. -Patient initially on IV Rocephin, azithromycin, blood cultures with no growth to date. -Azithromycin changed to doxycycline. -Patient now full comfort measures and as such we will discontinue antibiotics per family preference.  3.  Acute CHF exacerbation with preserved EF -2D echo done with EF of 70-75%, no WMA, mild concentric LVH, could not evaluate diastolic function, aortic and mitral valve degenerative/calcified with moderate MS. -Patient symptoms consistent with CHF exacerbation with elevated BNP, lower extremity edema, shortness of breath. -Continue IV Lasix. -Patient now for comfort measures likely to residential hospice home.  4.  Elevated D-dimer -CT angiogram negative for PE.  5.  Moderate MS -Patient now comfort measures.  6.  Elevated troponin/CAD/hypertension -Elevated troponin likely a demand ischemia in the setting of hypoxia, A. fib with RVR, CHF. -On IV Lasix. -Patient being transition to full comfort measures.  7.  New onset A. fib with RVR/junctional  rhythm/tachycardia -Was on a Cardizem drip with improvement with heart rate and has been transitioned to oral Cardizem. -Not on anticoagulation as patient now comfort measures.  8.  AKI -Monitor on diuretics.  9.  Generalized weakness/debility/ambulatory dysfunction -Patient seen by palliative care and decision made to transition to full comfort measures. -Patient likely to residential hospice home.  10.  Class I obesity -Patient now full comfort measures.  11.  Goals of care/EOL care -Patient noted to be under palliative care at the facility, patient admitted with multiple comorbidities, advanced age, DNR status, family elected not to escalate care and keep on comfort measures. -Patient assessed by palliative care and decision made to transition to full comfort measures and discharged to residential hospice home. -Discontinue telemetry.  Labs have been discontinued.  TOC consulted. -Palliative care following.   DVT prophylaxis: Comfort care Code Status: DNR Family Communication: Updated patient, daughter-in-law, granddaughter at bedside. Disposition:   Status is: Inpatient  Remains inpatient appropriate because: Patient now comfort measures and will be transition to residential hospice home when bed available.       Consultants:  Palliative care: Dr. Rowe Pavy 11/18/2020  Procedures:  CT angiogram chest 11/16/2020 Chest x-ray 10/29/2020 2D echo 11/16/2020    Antimicrobials:  Doxycycline 11/04/2020>>>>> 11/18/2020 IV Rocephin 11/05/2020>>>>>> 11/18/2020   Subjective: Patient laying in bed.  Family at bedside.  Daughter-in-law at bedside.  Denies any chest pain.  Feels shortness of breath has improved from last night.  Per RN it is noted that family refused antibiotics yesterday.  Objective: Vitals:   11/18/20 0330 11/18/20 0620 11/18/20 0859 11/18/20 0900  BP:      Pulse: 66 83  78  Resp: (!) 21 20  (!) 25  Temp:      TempSrc:      SpO2: 90% 95% 94% 94%   Weight:      Height:        Intake/Output Summary (Last 24 hours) at 11/18/2020 1254 Last data filed at 11/17/2020 2100 Gross per 24 hour  Intake 662.51 ml  Output 775 ml  Net -112.49 ml   Filed Weights   11/05/2020 1358 11/16/20 0600  Weight: 73.9 kg 76.4 kg    Examination:  General exam: Appears calm and comfortable  Respiratory system: Coarse breath sounds anterior lung fields.  Fair air movement.  Speaking in full sentences.  No use of accessory muscles of respiration.  Cardiovascular system: S1 & S2 heard, RRR. No JVD, murmurs, rubs, gallops or clicks. No pedal edema. Gastrointestinal system: Abdomen is nondistended, soft and nontender. No organomegaly or masses felt. Normal bowel sounds heard. Central nervous system: Alert and oriented. No focal neurological deficits. Extremities: Symmetric 5 x 5 power. Skin: No rashes, lesions or ulcers Psychiatry: Judgement and insight appear normal. Mood & affect appropriate.     Data Reviewed: I have personally reviewed following labs and imaging studies  CBC: Recent Labs  Lab 10/27/2020 1408 11/16/20 0037  WBC 18.0* 14.0*  NEUTROABS  --  10.5*  HGB 12.6 12.0  HCT 38.9 37.0  MCV 85.9 87.3  PLT 333 993    Basic Metabolic Panel: Recent Labs  Lab 11/12/2020 1408 10/24/2020 1958 11/16/20 0037  NA 136  --  132*  K 4.5  --  3.7  CL 96*  --  96*  CO2 29  --  24  GLUCOSE 149*  --  202*  BUN 28*  --  25*  CREATININE 1.38*  --  1.21*  CALCIUM 10.1  --  8.9  MG  --  2.0 2.0  PHOS  --   --  3.4    GFR: Estimated Creatinine Clearance: 26.9 mL/min (A) (by C-G formula based on SCr of 1.21 mg/dL (H)).  Liver Function Tests: Recent Labs  Lab 11/16/20 0037  AST 28  ALT 22  ALKPHOS 86  BILITOT 1.0  PROT 7.6  ALBUMIN 3.3*    CBG: No results for input(s): GLUCAP in the last 168 hours.   Recent Results (from the past 240 hour(s))  Resp Panel by RT-PCR (Flu A&B, Covid) Nasopharyngeal Swab     Status: None   Collection  Time: 11/07/2020  2:12 PM   Specimen: Nasopharyngeal Swab; Nasopharyngeal(NP) swabs in vial transport medium  Result Value Ref Range Status   SARS Coronavirus 2 by RT PCR NEGATIVE NEGATIVE Final    Comment: (NOTE) SARS-CoV-2 target nucleic acids are NOT DETECTED.  The SARS-CoV-2 RNA is generally detectable in upper respiratory specimens during the acute phase of infection. The lowest concentration of SARS-CoV-2 viral copies this assay can detect is 138 copies/mL. A negative result does not preclude SARS-Cov-2 infection and should not be used as the sole basis for treatment or other patient management decisions. A negative result may occur with  improper specimen collection/handling, submission of specimen other than nasopharyngeal swab, presence of viral mutation(s) within the areas targeted by this assay, and inadequate number of viral copies(<138 copies/mL). A negative result must be combined with clinical observations, patient history, and epidemiological information. The expected result is Negative.  Fact Sheet for Patients:  EntrepreneurPulse.com.au  Fact Sheet for Healthcare Providers:  IncredibleEmployment.be  This test is no t yet approved or cleared by the [place] FDA and  has been authorized for detection and/or diagnosis of SARS-CoV-2 by FDA under an Emergency Use Authorization (EUA). This EUA will remain  in effect (meaning this test can be used) for the duration of the COVID-19 declaration under Section 564(b)(1) of the Act, 21 U.S.C.section 360bbb-3(b)(1), unless the authorization is terminated  or revoked sooner.       Influenza A by PCR NEGATIVE NEGATIVE Final   Influenza B by PCR NEGATIVE NEGATIVE Final    Comment: (NOTE) The Xpert Xpress SARS-CoV-2/FLU/RSV plus assay is intended as an aid in the diagnosis of influenza from Nasopharyngeal swab specimens and should not be used as a sole basis for treatment. Nasal washings  and aspirates are unacceptable for Xpert Xpress SARS-CoV-2/FLU/RSV testing.  Fact Sheet for Patients: EntrepreneurPulse.com.au  Fact Sheet for Healthcare Providers: IncredibleEmployment.be  This test is not yet approved or cleared by the [place] FDA and has been authorized for detection and/or diagnosis of SARS-CoV-2 by FDA under an Emergency Use Authorization (EUA). This EUA will remain in effect (meaning this test can be used) for the duration of the COVID-19 declaration under Section 564(b)(1) of the Act, 21 U.S.C. section 360bbb-3(b)(1), unless the authorization is terminated or revoked.  Performed at [facility], [address], [place], [place] [address]   Culture, blood (Routine X 2) w Reflex to ID Panel     Status: None (Preliminary result)   Collection Time: 11/16/2020  9:11 PM   Specimen: BLOOD RIGHT ARM  Result Value Ref Range Status   Specimen Description   Final    BLOOD RIGHT ARM Performed at [facility], [place] [address], [place], [place] [address]    Special Requests   Final    BOTTLES DRAWN AEROBIC  AND ANAEROBIC Blood Culture adequate volume Performed at [facility] [address], [place], [place] [address]    Culture   Final    NO GROWTH 2 DAYS Performed at [facility] [address] [place], [place] [address]    Report Status PENDING  Incomplete  Culture, blood (Routine X 2) w Reflex to ID Panel     Status: None (Preliminary result)   Collection Time: 10/24/2020  9:11 PM   Specimen: BLOOD LEFT HAND  Result Value Ref Range Status   Specimen Description   Final    BLOOD LEFT HAND Performed at [facility] [address], [place], [place] [address]    Special Requests   Final    BOTTLES DRAWN AEROBIC ONLY Blood Culture adequate volume Performed at [facility] [address] [place], [place] [address]     Culture   Final    NO GROWTH 2 DAYS Performed at [facility] [address], [place], [place] [address]    Report Status PENDING  Incomplete  MRSA Next Gen by PCR, Nasal     Status: None   Collection Time: 11/02/2020 10:16 PM   Specimen: Nasal Mucosa; Nasal Swab  Result Value Ref Range Status   MRSA by PCR Next Gen NOT DETECTED NOT DETECTED Final    Comment: (NOTE) The GeneXpert MRSA Assay (FDA approved for NASAL specimens only), is one component of a comprehensive MRSA colonization surveillance program. It is not intended to diagnose MRSA infection nor to guide or monitor treatment for MRSA infections. Test performance is not FDA approved in patients less than 9 years old. Performed at [facility], [place] [address] [place], [place] [address]          Radiology Studies: No results found.      Scheduled Meds:  busPIRone  2.5 mg Oral BID   diltiazem  30 mg Oral Q6H   furosemide  40 mg Intravenous BID   loratadine  10 mg Oral Daily   morphine CONCENTRATE  5 mg Oral Q4H   Continuous Infusions:   LOS: 3 days    Time spent: 35 minutes    Irine Seal, MD [facility]   To contact the attending provider between 7A-7P or the covering provider during after hours 7P-7A, please log into the web site [email or web address] and access using universal [place] password for that web site. If you do not have the password, please call the hospital operator.  11/18/2020, 12:54 PM

## 2020-11-18 NOTE — Progress Notes (Signed)
    OVERNIGHT PROGRESS REPORT  Notified by RN for increased work of breathing per patient he was on palliative comfort measures path. RN/RT have administered breathing treatment and RN is administering morphine as needed (as ordered).  Patient has slight increased work of breathing and is requiring more oxygen via nasal cannula.  Patient does have audible indication for Lasix.  Lasix is ordered, timed dose and a smaller intermittent dose will be ordered for now to attempt to increase patient comfort.  RN is also continue to administer morphine IV and had earlier infiltration of IV and is re-dosing as ordered.  Family member is at bedside and is aware of this progression. Patient appears in no distress other than the increased requirements of oxygen and work of breathing    Gershon Cull MSNA MSN [place]

## 2020-11-18 NOTE — Progress Notes (Signed)
NP on-call for attending Gershon Cull),  text-paged regarding pt's increased O2 demand (15L [place] vs 7L at start of shift), and increased discomfort despite PRN Morphine being given several times.

## 2020-11-19 DIAGNOSIS — N179 Acute kidney failure, unspecified: Secondary | ICD-10-CM | POA: Diagnosis not present

## 2020-11-19 DIAGNOSIS — J9601 Acute respiratory failure with hypoxia: Secondary | ICD-10-CM | POA: Diagnosis not present

## 2020-11-19 DIAGNOSIS — I5031 Acute diastolic (congestive) heart failure: Secondary | ICD-10-CM | POA: Diagnosis not present

## 2020-11-19 DIAGNOSIS — J189 Pneumonia, unspecified organism: Secondary | ICD-10-CM | POA: Diagnosis not present

## 2020-11-19 DIAGNOSIS — R531 Weakness: Secondary | ICD-10-CM | POA: Diagnosis not present

## 2020-11-19 DIAGNOSIS — Z515 Encounter for palliative care: Secondary | ICD-10-CM | POA: Diagnosis not present

## 2020-11-19 LAB — METHYLMALONIC ACID, SERUM: Methylmalonic Acid, Quantitative: 280 nmol/L (ref 0–378)

## 2020-11-19 MED ORDER — MORPHINE BOLUS VIA INFUSION
1.0000 mg | INTRAVENOUS | Status: DC | PRN
Start: 2020-11-19 — End: 2020-11-20
  Administered 2020-11-20 (×15): 1 mg via INTRAVENOUS
  Filled 2020-11-19: qty 1

## 2020-11-19 MED ORDER — GLYCOPYRROLATE 1 MG PO TABS
1.0000 mg | ORAL_TABLET | ORAL | Status: DC | PRN
  Filled 2020-11-19: qty 1

## 2020-11-19 MED ORDER — GLYCOPYRROLATE 0.2 MG/ML IJ SOLN
0.2000 mg | INTRAMUSCULAR | Status: DC | PRN
  Filled 2020-11-19: qty 1

## 2020-11-19 MED ORDER — BIOTENE DRY MOUTH MT LIQD: 15.0000 mL | OROMUCOSAL | Status: DC | PRN

## 2020-11-19 MED ORDER — POLYVINYL ALCOHOL 1.4 % OP SOLN
1.0000 [drp] | Freq: Four times a day (QID) | OPHTHALMIC | Status: DC | PRN
  Filled 2020-11-19: qty 15

## 2020-11-19 MED ORDER — HALOPERIDOL LACTATE 2 MG/ML PO CONC
0.5000 mg | Freq: Two times a day (BID) | ORAL | Status: DC
  Filled 2020-11-19 (×4): qty 0.3

## 2020-11-19 MED ORDER — HALOPERIDOL 0.5 MG PO TABS
0.5000 mg | ORAL_TABLET | Freq: Two times a day (BID) | ORAL | Status: DC
  Filled 2020-11-19 (×4): qty 1

## 2020-11-19 MED ORDER — HALOPERIDOL LACTATE 5 MG/ML IJ SOLN
0.5000 mg | Freq: Two times a day (BID) | INTRAMUSCULAR | Status: DC
  Administered 2020-11-19: 0.5 mg via INTRAVENOUS
  Filled 2020-11-19: qty 1

## 2020-11-19 MED ORDER — MORPHINE 100MG IN NS 100ML (1MG/ML) PREMIX INFUSION
3.0000 mg/h | INTRAVENOUS | Status: DC
Start: 2020-11-19 — End: 2020-11-20
  Administered 2020-11-19: 2 mg/h via INTRAVENOUS
  Administered 2020-11-20: 5 mg/h via INTRAVENOUS
  Filled 2020-11-19 (×2): qty 100

## 2020-11-19 MED ORDER — HALOPERIDOL LACTATE 5 MG/ML IJ SOLN: 5.0000 mg | Freq: Once | INTRAMUSCULAR | Status: DC | PRN

## 2020-11-19 MED ORDER — FUROSEMIDE 10 MG/ML IJ SOLN
20.0000 mg | Freq: Two times a day (BID) | INTRAMUSCULAR | Status: DC
  Administered 2020-11-19 – 2020-11-20 (×2): 20 mg via INTRAVENOUS
  Filled 2020-11-19 (×2): qty 2

## 2020-11-19 MED ORDER — HALOPERIDOL LACTATE 5 MG/ML IJ SOLN
5.0000 mg | Freq: Once | INTRAMUSCULAR | Status: DC
  Filled 2020-11-19: qty 1

## 2020-11-19 MED ORDER — SODIUM CHLORIDE 0.9 % IV SOLN
INTRAVENOUS | Status: DC
  Administered 2020-11-19: 20 mL via INTRAVENOUS

## 2020-11-19 NOTE — Progress Notes (Signed)
Physical Therapy Discharge Patient Details Name: Jamie Brown MRN: [id number] DOB: 1924-12-26 Today's Date: 11/19/2020 Time:  -     Patient discharged from PT services secondary to medical decline - will need to re-order PT to resume therapy services.  Please see latest therapy progress note for current level of functioning and progress toward goals.    Progress and discharge plan discussed with patient and/or caregiver:  palliative care working with pt.  Comfort measures in place.  D/C plan currently either return to facility with hospice or residential hospice at [facility].  PT to sign of at this time.    Jannette Spanner PT, DPT Acute Rehabilitation Services Pager: [phone] Office: [place] 11/19/2020, 8:51 AM

## 2020-11-19 NOTE — Progress Notes (Signed)
PROGRESS NOTE    Jamie Brown  BHA:[id number] DOB: 04/04/1924 DOA: 10/30/2020 PCP: System, Provider Not In    Chief Complaint  Patient presents with   Shortness of Breath    Brief Narrative: Jamie Brown, 87 y.o. female with PMH of dementia, HTN/HLD/CAD s/p angioplasty 30 years ago generalized anxiety disorder, GI bleeding dementia who presented to [facility] ED with 2 to 3 days of progressive shortness of breath new onset productive cough swelling in the lower extremities, generalized weakness. She was seen in the [facility] ED, febrile 100.6, tachypneic in 23-35 initially hypoxic 88% on room air, improved to 96-91% on 2 L, lab work showed BNP elevated 332 creatinine 1.3 previously 0.9 on 09/28/2020 elevated high-sensitivity troponin at 109, leukocytosis 18 K lactic acidosis 2.0 procalcitonin elevated 8.1 COVID-19 negative, EKG sinus rhythm with bifascicular block, chest x-ray with infiltrates in the bilateral left greater than right concerning for pneumonia. Patient was found to have severe sepsis with community-acquired pneumonia acute hypoxic respiratory failure and congestive heart failure new onset and admitted to [facility]. Patient was given IV antibiotics IV Lasix, cardiac status is DNR and admitted for further management. She also had new onset A. fib with RVR, and had rapid response on 10/25 evening-with worsening respiratory status hypoxia tachycardia. Attending discussed with patient's family at bedside and decision was made not to escalate the care and keep the patient on comfort measures palliative care approach and continue to optimize patient's heart rate with Cardizem drip, placed on IV morphine, supplemental oxygen needed up to 7 L.  Reassessed by palliative care and decision for likely residential hospice home on discharge.  Assessment & Plan:   Principal Problem:   Acute respiratory failure with hypoxia (HCC) Active Problems:   HTN (hypertension)   Coronary artery  disease   Acute CHF (congestive heart failure) (HCC)   CAP (community acquired pneumonia)   Obesity (BMI 30-39.9)   Severe sepsis (HCC)   AKI (acute kidney injury) ([place])   Generalized weakness   Elevated troponin  1 acute respiratory failure with hypoxia secondary to acute CHF exacerbation/pneumonia -Patient was on IV antibiotics however states these have subsequently been discontinued as patient now comfort measures. -Patient currently on IV Lasix with urine output of 1200 cc over the past 24 hours. -Patient noted to have increased work of breathing overnight, some agitation, some respiratory distress requiring increased doses of IV morphine.   -Patient was transition to full comfort care.   -Patient actively dying, in the dying process.   -Patient has been started on a morphine drip with boluses as needed per palliative care.   -Haldol as needed also ordered per palliative care.   -Patient likely in-hospital death and likely will not survive transportation to residential hospice home.   -Palliative care following.  .  2.  Severe sepsis/CAP -On admission patient met criteria for sepsis with leukocytosis, lactic acidosis, hypoxia, concern for pneumonia, elevated procalcitonin consistent with pneumonia. -Patient initially on IV Rocephin, azithromycin, blood cultures with no growth to date. -Azithromycin changed to doxycycline. -Patient now full comfort measures and antibiotics have been discontinued.   3.  Acute CHF exacerbation with preserved EF -2D echo done with EF of 70-75%, no WMA, mild concentric LVH, could not evaluate diastolic function, aortic and mitral valve degenerative/calcified with moderate MS. -Patient symptoms consistent with CHF exacerbation with elevated BNP, lower extremity edema, shortness of breath. -Continue IV Lasix. -Patient now for comfort measures likely in-hospital death as patient seems to be  actively dying.   4.  Elevated D-dimer -CT angiogram negative  for PE.  5.  Moderate MS -Patient now comfort measures.  6.  Elevated troponin/CAD/hypertension -Elevated troponin likely a demand ischemia in the setting of hypoxia, A. fib with RVR, CHF. -On IV Lasix. -Patient has been transitioned to full comfort measures.   -Patient actively dying.   -Likely in-hospital death.   7.  New onset A. fib with RVR/junctional rhythm/tachycardia -Was on a Cardizem drip with improvement with heart rate and has been transitioned to oral Cardizem. -Not on anticoagulation as patient now comfort measures.  8.  AKI -Monitor on diuretics.  9.  Generalized weakness/debility/ambulatory dysfunction -Patient seen by palliative care and decision made to transition to full comfort measures. -Patient likely actively dying and likely in-hospital death.  10.  Class I obesity -Patient now full comfort measures.  11.  Goals of care/EOL care -Patient noted to be under palliative care at the facility, patient admitted with multiple comorbidities, advanced age, DNR status, family elected not to escalate care and keep on comfort measures. -Patient assessed by palliative care and decision made to transition to full comfort measures and discharged to residential hospice home. -Patient however with increased work of breathing, minimally responsive, likely actively dying suspect an in-hospital death. -Patient placed on a morphine drip with boluses as needed as well as Haldol as needed by palliative care. -Discontinued telemetry.  Labs have been discontinued. -Palliative care following.   DVT prophylaxis: Comfort care Code Status: DNR Family Communication: Updated daughter, family at bedside Disposition:   Status is: Inpatient  Remains inpatient appropriate because: Patient now comfort measures and will be transition to residential hospice home when bed available.       Consultants:  Palliative care: Dr. Rowe Pavy 11/18/2020  Procedures:  CT angiogram chest  11/16/2020 Chest x-ray 11/06/2020 2D echo 11/16/2020    Antimicrobials:  Doxycycline 11/18/2020>>>>> 11/18/2020 IV Rocephin 11/03/2020>>>>>> 11/18/2020   Subjective: Nonresponsive to minimally responsive.  Some agonal breaths.  Family at bedside.  Patient started on a morphine drip per palliative care.   Patient noted to have some increased work of breathing, generalized distress and agitation overnight.  Objective: Vitals:   11/18/20 1759 11/18/20 1814 11/19/20 0002 11/19/20 0500  BP: 127/68  (!) 129/93   Pulse:   (!) 102   Resp:      Temp:      TempSrc:      SpO2:  92% 91%   Weight:    75.9 kg  Height:        Intake/Output Summary (Last 24 hours) at 11/19/2020 1226 Last data filed at 11/18/2020 1300 Gross per 24 hour  Intake --  Output 600 ml  Net -600 ml    Filed Weights   10/29/2020 1358 11/16/20 0600 11/19/20 0500  Weight: 73.9 kg 76.4 kg 75.9 kg    Examination:  General exam: Labored breathing. Respiratory system: Coarse breath sounds anterior lung fields.  Some labored breathing.  Some use of accessory muscles of respiration.  Cardiovascular system: RRR no murmurs rubs or gallops.  No JVD.  No lower extremity edema.  Gastrointestinal system: Abdomen is soft, nontender, nondistended, positive bowel sounds.  No rebound.  No guarding.   Central nervous system: Minimally responsive.  No focal neurological deficits. Extremities: Unable to assess. Skin: No rashes, lesions or ulcers Psychiatry: Judgement and insight unable to assess as patient minimally responsive.     Data Reviewed: I have personally reviewed following labs and imaging studies  CBC: Recent Labs  Lab 11/16/2020 1408 11/16/20 0037  WBC 18.0* 14.0*  NEUTROABS  --  10.5*  HGB 12.6 12.0  HCT 38.9 37.0  MCV 85.9 87.3  PLT 333 322     Basic Metabolic Panel: Recent Labs  Lab 10/23/2020 1408 11/04/2020 1958 11/16/20 0037  NA 136  --  132*  K 4.5  --  3.7  CL 96*  --  96*  CO2 29  --  24   GLUCOSE 149*  --  202*  BUN 28*  --  25*  CREATININE 1.38*  --  1.21*  CALCIUM 10.1  --  8.9  MG  --  2.0 2.0  PHOS  --   --  3.4     GFR: Estimated Creatinine Clearance: 26.8 mL/min (A) (by C-G formula based on SCr of 1.21 mg/dL (H)).  Liver Function Tests: Recent Labs  Lab 11/16/20 0037  AST 28  ALT 22  ALKPHOS 86  BILITOT 1.0  PROT 7.6  ALBUMIN 3.3*     CBG: No results for input(s): GLUCAP in the last 168 hours.   Recent Results (from the past 240 hour(s))  Resp Panel by RT-PCR (Flu A&B, Covid) Nasopharyngeal Swab     Status: None   Collection Time: 10/30/2020  2:12 PM   Specimen: Nasopharyngeal Swab; Nasopharyngeal(NP) swabs in vial transport medium  Result Value Ref Range Status   SARS Coronavirus 2 by RT PCR NEGATIVE NEGATIVE Final    Comment: (NOTE) SARS-CoV-2 target nucleic acids are NOT DETECTED.  The SARS-CoV-2 RNA is generally detectable in upper respiratory specimens during the acute phase of infection. The lowest concentration of SARS-CoV-2 viral copies this assay can detect is 138 copies/mL. A negative result does not preclude SARS-Cov-2 infection and should not be used as the sole basis for treatment or other patient management decisions. A negative result may occur with  improper specimen collection/handling, submission of specimen other than nasopharyngeal swab, presence of viral mutation(s) within the areas targeted by this assay, and inadequate number of viral copies(<138 copies/mL). A negative result must be combined with clinical observations, patient history, and epidemiological information. The expected result is Negative.  Fact Sheet for Patients:  EntrepreneurPulse.com.au  Fact Sheet for Healthcare Providers:  IncredibleEmployment.be  This test is no t yet approved or cleared by the [place] FDA and  has been authorized for detection and/or diagnosis of SARS-CoV-2 by FDA under an Emergency Use  Authorization (EUA). This EUA will remain  in effect (meaning this test can be used) for the duration of the COVID-19 declaration under Section 564(b)(1) of the Act, 21 U.S.C.section 360bbb-3(b)(1), unless the authorization is terminated  or revoked sooner.       Influenza A by PCR NEGATIVE NEGATIVE Final   Influenza B by PCR NEGATIVE NEGATIVE Final    Comment: (NOTE) The Xpert Xpress SARS-CoV-2/FLU/RSV plus assay is intended as an aid in the diagnosis of influenza from Nasopharyngeal swab specimens and should not be used as a sole basis for treatment. Nasal washings and aspirates are unacceptable for Xpert Xpress SARS-CoV-2/FLU/RSV testing.  Fact Sheet for Patients: EntrepreneurPulse.com.au  Fact Sheet for Healthcare Providers: IncredibleEmployment.be  This test is not yet approved or cleared by the [place] FDA and has been authorized for detection and/or diagnosis of SARS-CoV-2 by FDA under an Emergency Use Authorization (EUA). This EUA will remain in effect (meaning this test can be used) for the duration of the COVID-19 declaration under Section 564(b)(1) of the Act, 21 U.S.C.  section 360bbb-3(b)(1), unless the authorization is terminated or revoked.  Performed at [facility], [address] [place], [place] [address]   Culture, blood (Routine X 2) w Reflex to ID Panel     Status: None (Preliminary result)   Collection Time: 10/25/2020  9:11 PM   Specimen: BLOOD RIGHT ARM  Result Value Ref Range Status   Specimen Description   Final    BLOOD RIGHT ARM Performed at [facility], [place] [address] [place], [place] [address]    Special Requests   Final    BOTTLES DRAWN AEROBIC AND ANAEROBIC Blood Culture adequate volume Performed at [facility] [address], [place], [place] [address]    Culture   Final    NO GROWTH 3 DAYS Performed at [facility], [place]  [address], [place], [place] [address]    Report Status PENDING  Incomplete  Culture, blood (Routine X 2) w Reflex to ID Panel     Status: None (Preliminary result)   Collection Time: 11/14/2020  9:11 PM   Specimen: BLOOD LEFT HAND  Result Value Ref Range Status   Specimen Description   Final    BLOOD LEFT HAND Performed at [facility] [address] [place], [place] [address]    Special Requests   Final    BOTTLES DRAWN AEROBIC ONLY Blood Culture adequate volume Performed at [facility] [address], [place], [place] [address]    Culture   Final    NO GROWTH 3 DAYS Performed at [facility], [place] [address], [place], [place] [address]    Report Status PENDING  Incomplete  MRSA Next Gen by PCR, Nasal     Status: None   Collection Time: 11/21/2020 10:16 PM   Specimen: Nasal Mucosa; Nasal Swab  Result Value Ref Range Status   MRSA by PCR Next Gen NOT DETECTED NOT DETECTED Final    Comment: (NOTE) The GeneXpert MRSA Assay (FDA approved for NASAL specimens only), is one component of a comprehensive MRSA colonization surveillance program. It is not intended to diagnose MRSA infection nor to guide or monitor treatment for MRSA infections. Test performance is not FDA approved in patients less than 35 years old. Performed at [facility] [place] [address], [place], [place] [address]           Radiology Studies: No results found.      Scheduled Meds:  albuterol  2.5 mg Nebulization TID   busPIRone  2.5 mg Oral BID   furosemide  20 mg Intravenous BID   haloperidol  0.5 mg Oral Q12H   Or   haloperidol  0.5 mg Sublingual Q12H   Or   haloperidol lactate  0.5 mg Intravenous Q12H   Continuous Infusions:  sodium chloride 20 mL (11/19/20 1102)   morphine 2 mg/hr (11/19/20 1058)     LOS: 4 days    Time spent: 35 minutes    Irine Seal, MD [facility]   To contact the attending provider between  7A-7P or the covering provider during after hours 7P-7A, please log into the web site [email or web address] and access using universal [place] password for that web site. If you do not have the password, please call the hospital operator.  11/19/2020, 12:26 PM

## 2020-11-19 NOTE — Progress Notes (Signed)
    OVERNIGHT PROGRESS REPORT  Notified by RN for concern, along with family members' concerns, for degree of comfort for this patient.  When arriving in room, patient is awake and in a degree of distress reportedly from and patient conveys same) some of the devices present (Non-rebreather mask, monitors etc)  Speaking with the patient and the family, they conveyed that at least some of the distress is from these monitors, therefore in light of her progression and current Palliative track, monitoring and O2 has been removed which seems to have made her more comfortable.   The family at bedside is very familiar with medical environments and treatments and understand that she can certainly have Oxygen at any time if it increases her comfort.   Also, a plan for increased medication administrations has been discussed with additional PRN now ordered if/when needed.  These are enacted in the interim for preparation for Palliative and Attending rounds for increased measures this AM.   She does appear more calm and composed at the time of this note.   Gershon Cull MSNA MSN ACNPC-AG Acute Care Nurse Practitioner [place]

## 2020-11-19 NOTE — Care Management Important Message (Signed)
Medicare IM printed for W/L Social Work to give to the patient. 

## 2020-11-19 NOTE — Progress Notes (Signed)
Chaplain responded to spiritual consult for EOL.  Room was filled with patient's 3 children, spouses, and two of the grandchildren.  The pastor had been there already.  Patient's daughter introduced family members and where they came from and said that the patient was a "Hidden Figures" person..-referring to the women mathematicians who did the calculations for space travel.  She then became a Pharmacist, hospital. Chaplain heard some of the family story and then offered scripture and prayer.The family expressed great gratitude for the care their mother had received and were most grateful that she was allowed to stay in the hospital and not get transported to hospice. More family is on the way for their goodbyes.  Chaplains will continue to be available.  Rev. Tamsen Snider Pager [phone]

## 2020-11-19 NOTE — Progress Notes (Signed)
[facility] ([facility])   There is not a bed available for Jamie Brown today at [facility].   [facility] will update family and hospital staff once our availability changes.   Thank you, Venia Carbon RN, BSN [facility] Liaison

## 2020-11-19 NOTE — Progress Notes (Signed)
Daily Progress Note   Patient Name: Jamie Brown       Date: 11/19/2020 DOB: January 28, 1924  Age: 87 y.o. MRN#: [id number] Attending Physician: Eugenie Filler, MD Primary Care Physician: System, Provider Not In Admit Date: 11/11/2020  Reason for Consultation/Follow-up: Terminal Care  Subjective: Overnight events noted.  Patient had increased work of breathing, generalized distress and agitation overnight. Medication history reviewed.  Continues to require escalating dosages of IV morphine.  Patient seen and examined.  Not as awake alert as on 11-18-2020.  Using accessory muscles of respiration. Family meeting to discuss further goals of care and appropriate disposition options held, see below.  Length of Stay: 4  Current Medications: Scheduled Meds:   albuterol  2.5 mg Nebulization TID   busPIRone  2.5 mg Oral BID   furosemide  20 mg Intravenous BID   haloperidol  0.5 mg Oral Q12H   Or   haloperidol  0.5 mg Sublingual Q12H   Or   haloperidol lactate  0.5 mg Intravenous Q12H    Continuous Infusions:  sodium chloride     morphine      PRN Meds: acetaminophen **OR** acetaminophen, albuterol, antiseptic oral rinse, glycopyrrolate, glycopyrrolate **OR** glycopyrrolate **OR** glycopyrrolate, haloperidol lactate, lip balm, morphine, polyvinyl alcohol  Physical Exam         Weak elderly lady resting in bed in Not awake alert Shallow diminished breath sounds, using accessory muscles S1-S2 Awakens somewhat to voice commands from family members Abdomen is not distended No edema Skin is warm and dry no coolness no mottling  Vital Signs: BP (!) 129/93 (BP Location: Right Arm)   Pulse (!) 102   Temp 98.4 F (36.9 C) (Oral)   Resp (!) 25   Ht 5' 1.5" (1.562 m)   Wt 75.9 kg    SpO2 91%   BMI 31.10 kg/m  SpO2: SpO2: 91 % O2 Device: O2 Device: Nasal Cannula O2 Flow Rate: O2 Flow Rate (L/min): 10 L/min  Intake/output summary:  Intake/Output Summary (Last 24 hours) at 11/19/2020 1012 Last data filed at 11/18/2020 1300 Gross per 24 hour  Intake --  Output 600 ml  Net -600 ml   LBM: Last BM Date:  (unknown) Baseline Weight: Weight: 73.9 kg Most recent weight: Weight: 75.9 kg       Palliative Assessment/Data:  Patient Active Problem List   Diagnosis Date Noted   Severe sepsis ([place]) 11/16/2020   AKI (acute kidney injury) ([place]) 11/16/2020   Generalized weakness 11/16/2020   Elevated troponin 11/16/2020   Acute respiratory failure with hypoxia ([place]) 11/09/2020   Acute CHF (congestive heart failure) ([place]) 11/01/2020   CAP (community acquired pneumonia) 11/03/2020   Obesity (BMI 30-39.9) 11/13/2020   Acute upper GI bleed 10/22/2016   Anemia 10/22/2016   Coronary artery disease 10/22/2016   Dens fracture ([place]) 10/22/2016   Hypokalemia 10/22/2016   Acute GI bleeding 10/22/2016   Sepsis ([place]) 05/28/2015   UTI (lower urinary tract infection) 05/28/2015   HLD (hyperlipidemia) 05/28/2015   HTN (hypertension) 05/28/2015   Lactic acidosis 05/28/2015   Insomnia 05/28/2015   Squamous cell carcinoma of skin of neck 10/29/2014    Palliative Care Assessment & Plan   Patient Profile:    Assessment:  87 year old lady has been at [facility] for about 4 years now.  Has dementia, hypertension dyslipidemia coronary artery disease status post angioplasty 30 years ago.  Also has history of generalized anxiety disorder GI bleeding.  Admitted to the hospital with progressive shortness of breath, swelling in lower extremities and generalized weakness.  Has been followed by palliative services at her facility.   Hospital course complicated by ongoing symptom burden with shortness of breath and anxiety complicated by agitation and air hunger.  Patient remains  admitted to hospital medicine service for community-acquired pneumonia, met criteria for sepsis, has hypoxic respiratory failure because of pneumonia as well as CHF component, acute heart failure exacerbation with preserved left ventricular ejection fraction, has diastolic dysfunction, has aortic and mitral valve degeneration/calcification with moderate mitral stenosis.   A palliative consultation has been requested for ongoing goals of care discussions.    Recommendations/Plan: Family meeting: I met with the patient's daughter Jamie Brown as well as several other family members initially at bedside and then outside the room as well.  Patient continues to have end-of-life signs and symptoms.  We discussed about decline trajectory and end-of-life signs and symptoms.  Discussed about full scope of comfort measures.  Prognosis possibly not more than few days at this point in my opinion. Medication history reviewed.  Patient will benefit from continuous infusion of morphine alongside provision of boluses.  Has some components of terminal delirium episodic, will have low-dose scheduled Haldol as well as as needed available. Chaplain consultation for support for family, comfort cart for family.   Goals of Care and Additional Recommendations: Limitations on Scope of Treatment: Full Comfort Care  Code Status:    Code Status Orders  (From admission, onward)           Start     Ordered   11/19/20 1010  Do not attempt resuscitation (DNR)  Continuous       Question Answer Comment  In the event of cardiac or respiratory ARREST Do not call a "code blue"   In the event of cardiac or respiratory ARREST Do not perform Intubation, CPR, defibrillation or ACLS   In the event of cardiac or respiratory ARREST Use medication by any route, position, wound care, and other measures to relive pain and suffering. May use oxygen, suction and manual treatment of airway obstruction as needed for comfort.      11/19/20  1011           Code Status History     Date Active Date Inactive Code Status Order ID Comments User Context   11/08/2020  2001 11/19/2020 1011 DNR [id number]  Rhetta Mura DO Inpatient   11/04/2020 1715 11/09/2020 2001 DNR [id number]  Annita Brod, MD ED   10/23/2016 1635 10/26/2016 2250 DNR [id number]  Paulla Fore, RN Inpatient   10/22/2016 2212 10/23/2016 1635 Full Code [id number]  Vianne Bulls, MD ED   05/28/2015 1759 06/01/2015 1402 Full Code [id number]  Barton Dubois, MD Inpatient      Advance Directive Documentation    Flowsheet Row Most Recent Value  Type of Advance Directive Healthcare Power of Attorney, Living will, Out of facility DNR (pink MOST or yellow form)  [son in law has durable poa and most form at bedside]  Pre-existing out of facility DNR order (yellow form or pink MOST form) --  "MOST" Form in Place? --       Prognosis:  Hours - Days  Discharge Planning: Anticipated Hospital Death  Care plan was discussed with patient's daughter Jamie Brown and several other family members.  Thank you for allowing the Palliative Medicine Team to assist in the care of this patient.   Time In: 9.30 Time Out: 10.05 Total Time 35 Prolonged Time Billed  no       Greater than 50%  of this time was spent counseling and coordinating care related to the above assessment and plan.  Loistine Chance, MD  Please contact Palliative Medicine Team phone at [phone] for questions and concerns.

## 2020-11-19 NOTE — Progress Notes (Addendum)
0530 Pt daughter concerned about respiratory status. "Does it sound like she's breathing funny? Like [place]attle? If so I might need to call my family in." Educated family she could do what she thinks best. I'll get charge or another nurse for a second listen to respiratory status. Soil scientist at [phone]. Charge RN in a room and said will come look at patient. Adonis Brook RN available to have a second look and listen to patient. Pt RR 18, moving arms to rub face, responsive. Family does not want to try and wake pt. Family refused to wake patient to give PO medication due to sleeping well. "May not be alert enough to swallow" 0600 Notified Charge RN again of patient family concerns.  0630 Charge RN in room. Pt RR 16 NADN Family members notified they can called in family at anytime at their discretion.

## 2020-11-20 DIAGNOSIS — R531 Weakness: Secondary | ICD-10-CM | POA: Diagnosis not present

## 2020-11-20 DIAGNOSIS — I2510 Atherosclerotic heart disease of native coronary artery without angina pectoris: Secondary | ICD-10-CM

## 2020-11-20 DIAGNOSIS — J9601 Acute respiratory failure with hypoxia: Secondary | ICD-10-CM | POA: Diagnosis not present

## 2020-11-21 LAB — CULTURE, BLOOD (ROUTINE X 2)
Culture: NO GROWTH
Culture: NO GROWTH
Special Requests: ADEQUATE
Special Requests: ADEQUATE

## 2020-11-23 NOTE — Death Summary Note (Signed)
DEATH SUMMARY   Patient Details  Name: Jamie Brown MRN: [id number] DOB: January 03, 1925  Admission/Discharge Information   Admit Date:  30-Nov-2020  Date of Death: Date of Death: 12-05-2020  Time of Death: Time of Death: 34  Length of Stay: 5  Referring Physician: System, Provider Not In   Reason(s) for Hospitalization  Shortness of breath  Diagnoses  Preliminary cause of death:  Secondary Diagnoses (including complications and co-morbidities):  Principal Problem:   Acute respiratory failure with hypoxia ([place]) Active Problems:   HTN (hypertension)   Coronary artery disease   Acute CHF (congestive heart failure) ([place])   CAP (community acquired pneumonia)   Obesity (BMI 30-39.9)   Severe sepsis (H[facility])   AKI (acute kidney injury) ([place])   Generalized weakness   Elevated troponin   Brief Hospital Course (including significant findings, care, treatment, and services provided and events leading to death)  Jamie Brown is a 87 y.o. year old female  with PMH of dementia, HTN/HLD/CAD s/p angioplasty 30 years ago generalized anxiety disorder, GI bleeding dementia who presented to [facility] ED with 2 to 3 days of progressive shortness of breath new onset productive cough swelling in the lower extremities, generalized weakness. She was seen in the [facility] ED, febrile 100.6, tachypneic in 23-35 initially hypoxic 88% on room air, improved to 96-91% on 2 L, lab work showed BNP elevated 332 creatinine 1.3 previously 0.9 on 09/28/2020 elevated high-sensitivity troponin at 109, leukocytosis 18 K lactic acidosis 2.0 procalcitonin elevated 8.1 COVID-19 negative, EKG sinus rhythm with bifascicular block, chest x-ray with infiltrates in the bilateral left greater than right concerning for pneumonia. Patient was found to have severe sepsis with community-acquired pneumonia acute hypoxic respiratory failure and congestive heart failure new onset and admitted to [facility]. Patient was given IV antibiotics  IV Lasix, cardiac status is DNR and admitted for further management. She also had new onset A. fib with RVR, and had rapid response on 10/25 evening-with worsening respiratory status hypoxia tachycardia. Attending discussed with patient's family at bedside and decision was made not to escalate the care and keep the patient on comfort measures palliative care approach and continue to optimize patient's heart rate with Cardizem drip, placed on IV morphine, supplemental oxygen needed up to 7 L.  Reassessed by palliative care and decision for likely residential hospice home versus in-hospital death.   1 acute respiratory failure with hypoxia secondary to acute CHF exacerbation/pneumonia -Patient was on IV antibiotics however. subsequently been discontinued as patient was transitioned to comfort measures. -Patient placed on IV Lasix during the hospitalization. -Patient noted to have increased work of breathing, some agitation, some respiratory distress requiring increased doses of IV morphine.   -Patient was seen by palliative care and decision made with family to transition to full comfort care.   -Patient was subsequently started on a morphine drip with boluses as needed per palliative care.   -Haldol as needed also ordered per palliative care.   -Patient continued to deteriorate during the hospitalization, subsequently placed on a morphine drip with boluses as needed.  Patient subsequently died at 93 hrs. on 2020/12/05.   -May his soul rest in peace.    2.  Severe sepsis/CAP -On admission patient met criteria for sepsis with leukocytosis, lactic acidosis, hypoxia, concern for pneumonia, elevated procalcitonin consistent with pneumonia. -Patient initially on IV Rocephin, azithromycin, blood cultures with no growth to date. -Azithromycin changed to doxycycline. -Patient subsequently transition to full comfort measures and antibiotics discontinued.   -  Patient was placed on a morphine drip, condition  deteriorated, patient subsequently died at 1420 hrs. on 12-07-20.  3.  Acute CHF exacerbation with preserved EF -2D echo done with EF of 70-75%, no WMA, mild concentric LVH, could not evaluate diastolic function, aortic and mitral valve degenerative/calcified with moderate MS. -Patient symptoms consistent with CHF exacerbation with elevated BNP, lower extremity edema, shortness of breath. -Patient placed on IV Lasix with some diuresis.  -Patient subsequently transition to comfort measures. -Patient actively dying during the hospitalization, condition deteriorated, patient placed on a morphine drip, patient subsequently died at 1420 hrs. on 12-07-2020.   4.  Elevated D-dimer -CT angiogram negative for PE.  5.  Moderate MS -Patient was transitioned to comfort measures.  6.  Elevated troponin/CAD/hypertension -Elevated troponin likely a demand ischemia in the setting of hypoxia, A. fib with RVR, CHF. -Was placed on IV Lasix. -Patient has been transitioned to full comfort measures.   -Patient actively dying.   -Patient's condition deteriorated, was placed on a morphine drip, patient subsequently died at 1420 hrs. on 07-Dec-2020.  7.  New onset A. fib with RVR/junctional rhythm/tachycardia -Was on a Cardizem drip with improvement with heart rate and patient subsequently transition to oral Cardizem.   -Patient seen by palliative care, patient continued to deteriorate and decision was full comfort measures.   -Patient placed on a morphine drip, and subsequently died at 1420 hrs. on 2020-12-07.  8.  AKI -.  Improved with diuretics.  9.  Generalized weakness/debility/ambulatory dysfunction -Patient seen by palliative care and decision made to transition to full comfort measures. -Patient actively dying and continued to deteriorate, was placed on a morphine drip, subsequently had agonal breaths, and subsequently died peacefully at 1420 hrs. on 07-Dec-2020.  10.  Class I obesity -Patient was  subsequently transitioned to full comfort measures.  Patient's condition deteriorated and patient subsequently died at 1420 hrs. Dec 07, 2020.  11.  Goals of care/EOL care -Patient noted to be under palliative care at the facility, patient admitted with multiple comorbidities, advanced age, DNR status, family elected not to escalate care and keep on comfort measures. -Patient assessed by palliative care and decision made to transition to full comfort measures and discharged to residential hospice home. -Patient however with increased work of breathing, minimally responsive,  actively dying.  -Patient placed on a morphine drip with boluses as needed as well as Haldol as needed by palliative care. -Patient continued to deteriorate, was having agonal breaths, kept comfortable and subsequently died at 2:20 PM on 2020/12/07. -May her soul rest in peace     Pertinent Labs and Studies  Significant Diagnostic Studies DG Chest 2 View  Result Date: 11/01/2020 CLINICAL DATA:  Shortness of breath EXAM: CHEST - 2 VIEW COMPARISON:  05/28/2015 FINDINGS: Heart size is normal. Chronic aortic atherosclerosis. Patchy bilateral lower lobe pneumonia, left worse than right. No dense consolidation or lobar collapse. No visible effusion. No acute bone finding. IMPRESSION: Patchy infiltrates in both lower lobes, left more than right, consistent with bronchopneumonia. Electronically Signed   By: Nelson Chimes M.D.   On: 11/09/2020 15:20   CT Angio Chest Pulmonary Embolism (PE) W or WO Contrast  Result Date: 11/16/2020 CLINICAL DATA:  Worsening shortness of breath. EXAM: CT ANGIOGRAPHY CHEST WITH CONTRAST TECHNIQUE: Multidetector CT imaging of the chest was performed using the standard protocol during bolus administration of intravenous contrast. Multiplanar CT image reconstructions and MIPs were obtained to evaluate the vascular anatomy. CONTRAST:  26m OMNIPAQUE IOHEXOL 350 MG/ML SOLN  COMPARISON:  None. FINDINGS:  Cardiovascular: There is marked severity calcification of the thoracic aorta. The ascending thoracic aorta measures 4.0 cm x 3.8 cm. The subsegmental pulmonary arteries within the bilateral lung bases are limited secondary to patient motion. No evidence of pulmonary embolism. Mild cardiomegaly with marked severity coronary artery calcification. No pericardial effusion. Mediastinum/Nodes: No enlarged mediastinal, hilar, or axillary lymph nodes. Thyroid gland, trachea, and esophagus demonstrate no significant findings. Lungs/Pleura: Mild atelectasis is seen along the posterior aspect of the bilateral lower lobes, right greater than left. Additional mild atelectatic changes are seen within the inferior aspect of the left upper lobe. There is no evidence of a pleural effusion or pneumothorax. Upper Abdomen: No acute abnormality. Musculoskeletal: Multilevel degenerative changes are seen throughout the thoracic spine. Review of the MIP images confirms the above findings. IMPRESSION: 1. No evidence of pulmonary embolism. 2. Mild bilateral lower lobe and left upper lobe atelectasis, right greater than left. 3. Mild cardiomegaly with marked severity coronary artery calcification. Aortic Atherosclerosis (ICD10-I70.0). Electronically Signed   By: Virgina Norfolk M.D.   On: 11/16/2020 02:51   ECHOCARDIOGRAM COMPLETE  Result Date: 11/16/2020    ECHOCARDIOGRAM REPORT   Patient Name:   MAXX CALAWAY Date of Exam: 11/16/2020 Medical Rec #:  [id number]    Height:       61.5 in Accession #:    [id number]   Weight:       168.4 lb Date of Birth:  [birth year]    BSA:          1.766 m Patient Age:    9 years     BP:           126/57 mmHg Patient Gender: F            HR:           91 bpm. Exam Location:  Inpatient Procedure: 2D Echo, Cardiac Doppler and Color Doppler Indications:    CHF  History:        Patient has no prior history of Echocardiogram examinations.                 CAD, Signs/Symptoms:Shortness of Breath and Dementia,  sepsis,                 pneumonia; Risk Factors:Hypertension and Dyslipidemia.  Sonographer:    Dustin Flock RDCS Referring Phys: [id number] Rhetta Mura  Sonographer Comments: Technically challenging study due to limited acoustic windows. Patient confused. IMPRESSIONS  1. Left ventricular ejection fraction, by estimation, is 70 to 75%. The left ventricle has hyperdynamic function. The left ventricle has no regional wall motion abnormalities. There is mild concentric left ventricular hypertrophy. Left ventricular diastolic function could not be evaluated.  2. Right ventricular systolic function is normal. The right ventricular size is normal. Tricuspid regurgitation signal is inadequate for assessing PA pressure.  3. The mitral valve is degenerative. No evidence of mitral valve regurgitation. Moderate mitral stenosis. The mean mitral valve gradient is 9.0 mmHg with average heart rate of 89 bpm.  4. The aortic valve is calcified. Aortic valve regurgitation is mild. Mild to moderate aortic valve stenosis.  5. The inferior vena cava is normal in size with <50% respiratory variability, suggesting right atrial pressure of 8 mmHg. FINDINGS  Left Ventricle: Left ventricular ejection fraction, by estimation, is 70 to 75%. The left ventricle has hyperdynamic function. The left ventricle has no regional wall motion abnormalities. The left ventricular internal cavity size was small. There is  mild concentric left ventricular hypertrophy. Left ventricular diastolic function could not be evaluated due to mitral annular calcification (moderate or greater). Left ventricular diastolic function could not be evaluated. Right Ventricle: The right ventricular size is normal. No increase in right ventricular wall thickness. Right ventricular systolic function is normal. Tricuspid regurgitation signal is inadequate for assessing PA pressure. Left Atrium: Left atrial size was normal in size. Right Atrium: Right atrial size was  normal in size. Pericardium: Trivial pericardial effusion is present. Presence of pericardial fat pad. Mitral Valve: The mitral valve is degenerative in appearance. No evidence of mitral valve regurgitation. Moderate mitral valve stenosis. MV peak gradient, 22.1 mmHg. The mean mitral valve gradient is 9.0 mmHg with average heart rate of 89 bpm. Tricuspid Valve: The tricuspid valve is grossly normal. Tricuspid valve regurgitation is trivial. No evidence of tricuspid stenosis. Aortic Valve: The aortic valve is calcified. Aortic valve regurgitation is mild. Aortic regurgitation PHT measures 305 msec. Mild to moderate aortic stenosis is present. Aortic valve mean gradient measures 17.0 mmHg. Aortic valve peak gradient measures 26.3 mmHg. Aortic valve area, by VTI measures 1.72 cm. Pulmonic Valve: The pulmonic valve was grossly normal. Pulmonic valve regurgitation is not visualized. No evidence of pulmonic stenosis. Aorta: The aortic root is normal in size and structure. Venous: The inferior vena cava is normal in size with less than 50% respiratory variability, suggesting right atrial pressure of 8 mmHg. IAS/Shunts: The atrial septum is grossly normal.  LEFT VENTRICLE PLAX 2D LVIDd:         3.00 cm   Diastology LVIDs:         2.00 cm   LV e' medial:    4.57 cm/s LV PW:         1.20 cm   LV E/e' medial:  40.9 LV IVS:        1.30 cm   LV e' lateral:   5.55 cm/s LVOT diam:     2.00 cm   LV E/e' lateral: 33.7 LV SV:         87 LV SV Index:   49 LVOT Area:     3.14 cm  RIGHT VENTRICLE RV Basal diam:  2.60 cm RV S prime:     11.50 cm/s TAPSE (M-mode): 1.8 cm LEFT ATRIUM             Index        RIGHT ATRIUM           Index LA diam:        3.40 cm 1.92 cm/m   RA Area:     14.00 cm LA Vol (A2C):   37.4 ml 21.17 ml/m  RA Volume:   32.40 ml  18.34 ml/m LA Vol (A4C):   40.4 ml 22.87 ml/m LA Biplane Vol: 40.4 ml 22.87 ml/m  AORTIC VALVE AV Area (Vmax):    1.32 cm AV Area (Vmean):   1.12 cm AV Area (VTI):     1.72 cm AV  Vmax:           256.50 cm/s AV Vmean:          200.000 cm/s AV VTI:            0.503 m AV Peak Grad:      26.3 mmHg AV Mean Grad:      17.0 mmHg LVOT Vmax:         108.00 cm/s LVOT Vmean:        71.600 cm/s LVOT VTI:  0.276 m LVOT/AV VTI ratio: 0.55 AI PHT:            305 msec  AORTA Ao Root diam: 2.90 cm MITRAL VALVE MV Area (PHT): 2.78 cm     SHUNTS MV Area VTI:   1.77 cm     Systemic VTI:  0.28 m MV Peak grad:  22.1 mmHg    Systemic Diam: 2.00 cm MV Mean grad:  9.0 mmHg MV Vmax:       2.35 m/s MV Vmean:      138.0 cm/s MV VTI:        0.49 m MV Decel Time: 273 msec MV E velocity: 187.00 cm/s MV A velocity: 200.00 cm/s MV E/A ratio:  0.94 Eleonore Chiquito MD Electronically signed by Eleonore Chiquito MD Signature Date/Time: 11/16/2020/1:18:41 PM    Final     Microbiology Recent Results (from the past 240 hour(s))  Resp Panel by RT-PCR (Flu A&B, Covid) Nasopharyngeal Swab     Status: None   Collection Time: 10/28/2020  2:12 PM   Specimen: Nasopharyngeal Swab; Nasopharyngeal(NP) swabs in vial transport medium  Result Value Ref Range Status   SARS Coronavirus 2 by RT PCR NEGATIVE NEGATIVE Final    Comment: (NOTE) SARS-CoV-2 target nucleic acids are NOT DETECTED.  The SARS-CoV-2 RNA is generally detectable in upper respiratory specimens during the acute phase of infection. The lowest concentration of SARS-CoV-2 viral copies this assay can detect is 138 copies/mL. A negative result does not preclude SARS-Cov-2 infection and should not be used as the sole basis for treatment or other patient management decisions. A negative result may occur with  improper specimen collection/handling, submission of specimen other than nasopharyngeal swab, presence of viral mutation(s) within the areas targeted by this assay, and inadequate number of viral copies(<138 copies/mL). A negative result must be combined with clinical observations, patient history, and epidemiological information. The expected result is  Negative.  Fact Sheet for Patients:  EntrepreneurPulse.com.au  Fact Sheet for Healthcare Providers:  IncredibleEmployment.be  This test is no t yet approved or cleared by the [place] FDA and  has been authorized for detection and/or diagnosis of SARS-CoV-2 by FDA under an Emergency Use Authorization (EUA). This EUA will remain  in effect (meaning this test can be used) for the duration of the COVID-19 declaration under Section 564(b)(1) of the Act, 21 U.S.C.section 360bbb-3(b)(1), unless the authorization is terminated  or revoked sooner.       Influenza A by PCR NEGATIVE NEGATIVE Final   Influenza B by PCR NEGATIVE NEGATIVE Final    Comment: (NOTE) The Xpert Xpress SARS-CoV-2/FLU/RSV plus assay is intended as an aid in the diagnosis of influenza from Nasopharyngeal swab specimens and should not be used as a sole basis for treatment. Nasal washings and aspirates are unacceptable for Xpert Xpress SARS-CoV-2/FLU/RSV testing.  Fact Sheet for Patients: EntrepreneurPulse.com.au  Fact Sheet for Healthcare Providers: IncredibleEmployment.be  This test is not yet approved or cleared by the [place] FDA and has been authorized for detection and/or diagnosis of SARS-CoV-2 by FDA under an Emergency Use Authorization (EUA). This EUA will remain in effect (meaning this test can be used) for the duration of the COVID-19 declaration under Section 564(b)(1) of the Act, 21 U.S.C. section 360bbb-3(b)(1), unless the authorization is terminated or revoked.  Performed at [facility], [address] [place], [place] [address]   Culture, blood (Routine X 2) w Reflex to ID Panel     Status: None (Preliminary result)  Collection Time: 11/14/2020  9:11 PM   Specimen: BLOOD RIGHT ARM  Result Value Ref Range Status   Specimen Description   Final    BLOOD RIGHT ARM Performed at [facility] [address] [place], [place] [address]    Special Requests   Final    BOTTLES DRAWN AEROBIC AND ANAEROBIC Blood Culture adequate volume Performed at [facility] [address], [place], [place] [address]    Culture   Final    NO GROWTH 4 DAYS Performed at [facility], [place] [address], [place], [place] [address]    Report Status PENDING  Incomplete  Culture, blood (Routine X 2) w Reflex to ID Panel     Status: None (Preliminary result)   Collection Time: 10/31/2020  9:11 PM   Specimen: BLOOD LEFT HAND  Result Value Ref Range Status   Specimen Description   Final    BLOOD LEFT HAND Performed at [facility] [address] [place], [place] [address]    Special Requests   Final    BOTTLES DRAWN AEROBIC ONLY Blood Culture adequate volume Performed at [facility] [address], [place], [place] [address]    Culture   Final    NO GROWTH 4 DAYS Performed at [facility], [place] [address], [place], [place] [address]    Report Status PENDING  Incomplete  MRSA Next Gen by PCR, Nasal     Status: None   Collection Time: 11/05/2020 10:16 PM   Specimen: Nasal Mucosa; Nasal Swab  Result Value Ref Range Status   MRSA by PCR Next Gen NOT DETECTED NOT DETECTED Final    Comment: (NOTE) The GeneXpert MRSA Assay (FDA approved for NASAL specimens only), is one component of a comprehensive MRSA colonization surveillance program. It is not intended to diagnose MRSA infection nor to guide or monitor treatment for MRSA infections. Test performance is not FDA approved in patients less than 66 years old. Performed at [facility], [place] [address], [place], [place] [address]     Lab Basic Metabolic Panel: Recent Labs  Lab 10/27/2020 1408 10/31/2020 1958 11/16/20 0037  NA 136  --  132*  K 4.5  --  3.7  CL 96*  --  96*  CO2 29  --  24  GLUCOSE 149*  --  202*  BUN 28*  --  25*   CREATININE 1.38*  --  1.21*  CALCIUM 10.1  --  8.9  MG  --  2.0 2.0  PHOS  --   --  3.4   Liver Function Tests: Recent Labs  Lab 11/16/20 0037  AST 28  ALT 22  ALKPHOS 86  BILITOT 1.0  PROT 7.6  ALBUMIN 3.3*   No results for input(s): LIPASE, AMYLASE in the last 168 hours. No results for input(s): AMMONIA in the last 168 hours. CBC: Recent Labs  Lab 11/17/2020 1408 11/16/20 0037  WBC 18.0* 14.0*  NEUTROABS  --  10.5*  HGB 12.6 12.0  HCT 38.9 37.0  MCV 85.9 87.3  PLT 333 322   Cardiac Enzymes: No results for input(s): CKTOTAL, CKMB, CKMBINDEX, TROPONINI in the last 168 hours. Sepsis Labs: Recent Labs  Lab 11/12/2020 1408 11/14/2020 1655 11/08/2020 2111 11/16/20 0037  PROCALCITON  --  8.15  --   --   WBC 18.0*  --   --  14.0*  LATICACIDVEN  --  2.0* 1.6  --  Procedures/Operations  CT angiogram chest 11/16/2020 Chest x-ray 11/02/2020 2D echo 11/16/2020   Irine Seal 12-09-2020, 4:34 PM

## 2020-11-23 NOTE — Progress Notes (Signed)
Pt's work of breathing increased and family expressed concern for patient being uncomfortable and in pain. Dr. Rowe Pavy paged and made aware. New orders to titrate Morphine gtt as needed. Family updated and verbalized understanding/appreciation.

## 2020-11-23 NOTE — Plan of Care (Signed)

## 2020-11-23 NOTE — Progress Notes (Signed)
Approximately 70 ml of Morphine wasted with Russ Halo, RN.

## 2020-11-23 NOTE — Progress Notes (Signed)
Daily Progress Note   Patient Name: Jamie Brown       Date: Dec 10, 2020 DOB: 1924-10-12  Age: 87 y.o. MRN#: [id number] Attending Physician: Eugenie Filler, MD Primary Care Physician: System, Provider Not In Admit Date: 11/13/2020  Reason for Consultation/Follow-up: Terminal Care  Subjective: Family at bedside, appreciate chaplain visit. Patient overall appears comfortable. On Morphine drip, not with any non verbal gestures od distress of discomfort. We again discussed about end of life signs and symptoms.     Length of Stay: 5  Current Medications: Scheduled Meds:   busPIRone  2.5 mg Oral BID   furosemide  20 mg Intravenous BID   haloperidol  0.5 mg Oral Q12H   Or   haloperidol  0.5 mg Sublingual Q12H   Or   haloperidol lactate  0.5 mg Intravenous Q12H    Continuous Infusions:  sodium chloride 20 mL/hr at 11/19/20 1904   morphine 2 mg/hr (11/19/20 1858)    PRN Meds: acetaminophen **OR** acetaminophen, albuterol, antiseptic oral rinse, glycopyrrolate, glycopyrrolate **OR** glycopyrrolate **OR** glycopyrrolate, haloperidol lactate, lip balm, morphine, polyvinyl alcohol  Physical Exam         Weak elderly lady resting in bed  Not awake alert, not in distress.  Still with rapid shallow breath sounds. Awakens somewhat to voice commands from family members Abdomen is not distended No edema Skin is warm and dry no coolness no mottling  Vital Signs: BP (!) 129/93 (BP Location: Right Arm)   Pulse (!) 102   Temp 98.4 F (36.9 C) (Oral)   Resp (!) 25   Ht 5' 1.5" (1.562 m)   Wt 75.9 kg   SpO2 91%   BMI 31.10 kg/m  SpO2: SpO2: 91 % O2 Device: O2 Device: Nasal Cannula O2 Flow Rate: O2 Flow Rate (L/min): 10 L/min  Intake/output summary:  Intake/Output Summary (Last 24  hours) at 12/10/2020 0930 Last data filed at 2020/12/10 2876 Gross per 24 hour  Intake 350.27 ml  Output 1600 ml  Net -1249.73 ml    LBM: Last BM Date:  (unknown) Baseline Weight: Weight: 73.9 kg Most recent weight: Weight: 75.9 kg       Palliative Assessment/Data:      Patient Active Problem List   Diagnosis Date Noted   Severe sepsis ([place]) 11/16/2020   AKI (acute kidney  injury) ([place]) 11/16/2020   Generalized weakness 11/16/2020   Elevated troponin 11/16/2020   Acute respiratory failure with hypoxia ([place]) 11/12/2020   Acute CHF (congestive heart failure) ([place]) 11/22/2020   CAP (community acquired pneumonia) 11/04/2020   Obesity (BMI 30-39.9) 11/21/2020   Acute upper GI bleed 10/22/2016   Anemia 10/22/2016   Coronary artery disease 10/22/2016   Dens fracture ([place]) 10/22/2016   Hypokalemia 10/22/2016   Acute GI bleeding 10/22/2016   Sepsis ([place]) 05/28/2015   UTI (lower urinary tract infection) 05/28/2015   HLD (hyperlipidemia) 05/28/2015   HTN (hypertension) 05/28/2015   Lactic acidosis 05/28/2015   Insomnia 05/28/2015   Squamous cell carcinoma of skin of neck 10/29/2014    Palliative Care Assessment & Plan   Patient Profile:    Assessment:  87 year old lady has been at [facility] for about 4 years now.  Has dementia, hypertension dyslipidemia coronary artery disease status post angioplasty 30 years ago.  Also has history of generalized anxiety disorder GI bleeding.  Admitted to the hospital with progressive shortness of breath, swelling in lower extremities and generalized weakness.  Has been followed by palliative services at her facility.   Hospital course complicated by ongoing symptom burden with shortness of breath and anxiety complicated by agitation and air hunger.  Patient remains admitted to hospital medicine service for community-acquired pneumonia, met criteria for sepsis, has hypoxic respiratory failure because of pneumonia as well as CHF component,  acute heart failure exacerbation with preserved left ventricular ejection fraction, has diastolic dysfunction, has aortic and mitral valve degeneration/calcification with moderate mitral stenosis.   A palliative consultation has been requested for ongoing goals of care discussions.    Recommendations/Plan: Medications noted.  Continue comfort measures Anticipated hospital death Prognosis hours to some very limited number of days.     Goals of Care and Additional Recommendations: Limitations on Scope of Treatment: Full Comfort Care  Code Status:    Code Status Orders  (From admission, onward)           Start     Ordered   11/19/20 1010  Do not attempt resuscitation (DNR)  Continuous       Question Answer Comment  In the event of cardiac or respiratory ARREST Do not call a "code blue"   In the event of cardiac or respiratory ARREST Do not perform Intubation, CPR, defibrillation or ACLS   In the event of cardiac or respiratory ARREST Use medication by any route, position, wound care, and other measures to relive pain and suffering. May use oxygen, suction and manual treatment of airway obstruction as needed for comfort.      11/19/20 1011           Code Status History     Date Active Date Inactive Code Status Order ID Comments User Context   11/04/2020 2001 11/19/2020 1011 DNR [id number]  Rhetta Mura DO Inpatient   11/22/2020 1715 11/22/2020 2001 DNR [id number]  Annita Brod, MD ED   10/23/2016 1635 10/26/2016 2250 DNR [id number]  Paulla Fore, RN Inpatient   10/22/2016 2212 10/23/2016 1635 Full Code [id number]  Vianne Bulls, MD ED   05/28/2015 1759 06/01/2015 1402 Full Code [id number]  Barton Dubois, MD Inpatient      Advance Directive Documentation    Flowsheet Row Most Recent Value  Type of Advance Directive Healthcare Power of [place], Living will, Out of facility DNR (pink MOST or yellow form)  [son in law has durable poa and most form  at bedside]   Pre-existing out of facility DNR order (yellow form or pink MOST form) --  "MOST" Form in Place? --       Prognosis:  Hours - Days  Discharge Planning: Anticipated Hospital Death  Care plan was discussed with patient's daughter Izora Gala and several other family members.  Thank you for allowing the Palliative Medicine Team to assist in the care of this patient.   Time In: 9 Time Out: 9.25 Total Time 25 Prolonged Time Billed  no       Greater than 50%  of this time was spent counseling and coordinating care related to the above assessment and plan.  Loistine Chance, MD  Please contact Palliative Medicine Team phone at [phone] for questions and concerns.

## 2020-11-23 NOTE — Progress Notes (Signed)
Approximately 5 ml of Morphine gtt wasted with Russ Halo, RN.

## 2020-11-23 NOTE — Progress Notes (Signed)
Chaplain provided grief support to family as well as ministry of presence.  Family had already had a prayer, but requested chaplain recite Ps 23 with them.  [address], [place] Pager, [phone]

## 2020-11-23 NOTE — Progress Notes (Signed)
Pt without respirations and pulse at 2:20 pm. Verified by Sharyn Blitz, RN and Russ Halo, RN. Family at bedside. Chaplain made aware and at bedside with family. Dr. Grandville Silos and Dr. Rowe Pavy paged and made aware.

## 2020-11-23 NOTE — Progress Notes (Addendum)
[facility]) Hospital Liaison Note   Unfortunately, [facility] is not able to offer a room today. Family and [facility] Manager aware hospital liaison will follow up tomorrow or sooner if a room becomes available.    Please do not hesitate to call with any hospice related questions.    Thank you for the opportunity to participate in this patient's care.   Bobbie "Loren Racer, RN, BSN [facility] Liaison [phone]

## 2020-11-23 NOTE — TOC Progression Note (Signed)
Transition of Care [facility]) - Progression Note    Patient Details  Name: ARTHELIA CALLICOTT MRN: [id number] Date of Birth: [birth year]  Transition of Care [facility]) CM/SW Contact  Ross Ludwig, [place] Phone Number: Nov 30, 2020, 2:39 PM  Clinical Narrative:     No beds available at [facility] at this time, CSW continuing to follow patient's progress throughout discharge planning.  Expected Discharge Plan: [place] Barriers to Discharge: Continued Medical Work up  Expected Discharge Plan and Services Expected Discharge Plan: [place]   Discharge Planning Services: CM Consult Post Acute Care Choice: [place] Living arrangements for the past 2 months: [place]                                       Social Determinants of Health (SDOH) Interventions    Readmission Risk Interventions No flowsheet data found.

## 2020-11-23 DEATH — deceased

## 2021-10-28 IMAGING — CT CT HEAD W/O CM
4 of 5 series · 15 of 47 positions shown, 17 images · non-contrast
Comparison: Head CT 11/04/2014.

CLINICAL DATA: [AGE] female status post unwitnessed fall from
bed.

EXAM:
CT HEAD WITHOUT CONTRAST
TECHNIQUE: Contiguous axial images were obtained from the base of the skull
through the vertex without intravenous contrast.

[Series 2: head wo · axial · 0.45mm/px · z∈[-105,-0]mm · 5 of 33 slices shown, 7 images]
[im 6/33  brain]
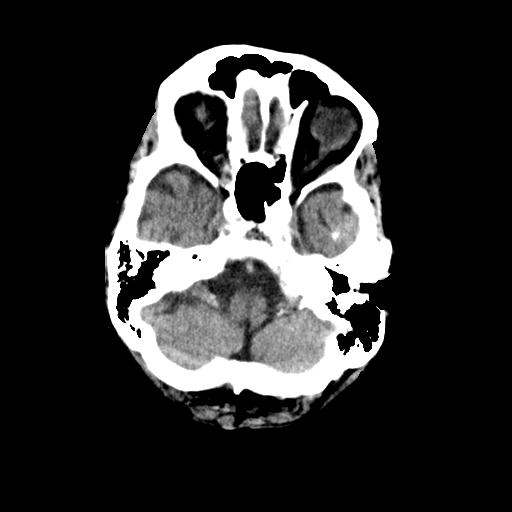
[im 6/33  bone]
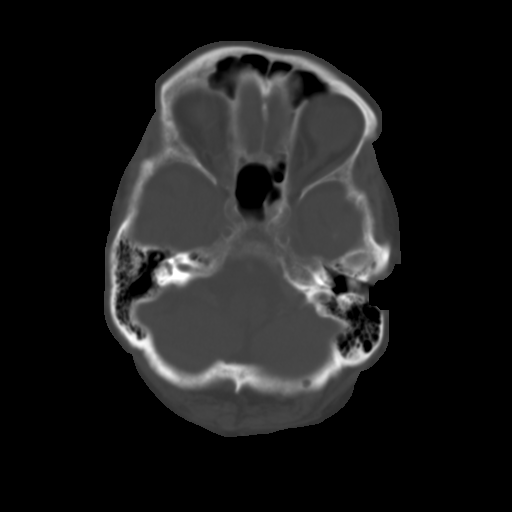
[im 11/33  brain]
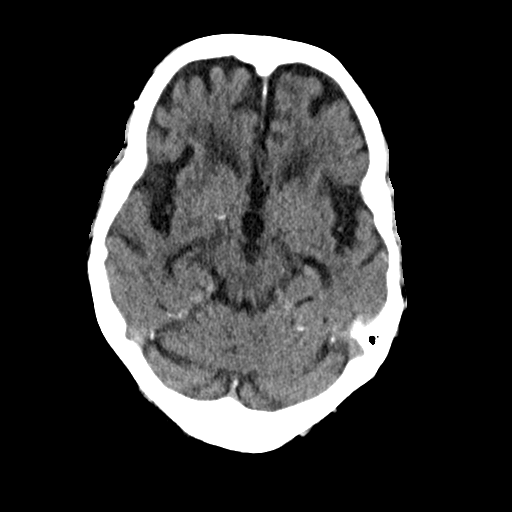
[im 17/33  brain]
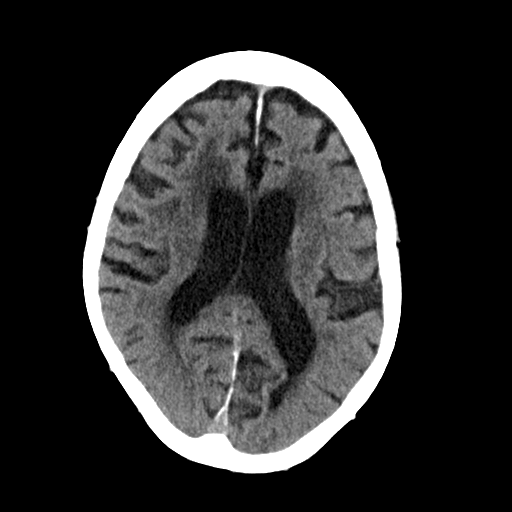
[im 22/33  brain]
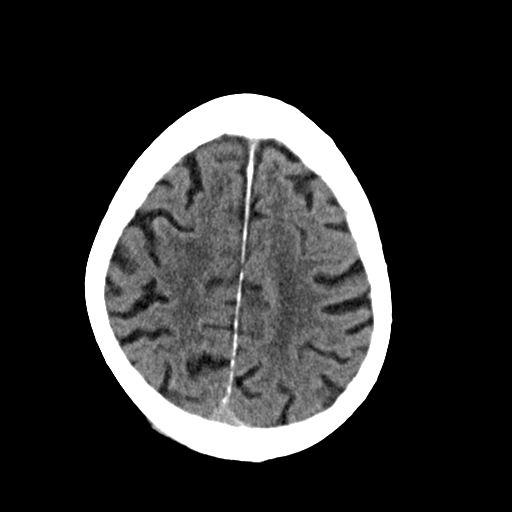
[im 27/33  brain]
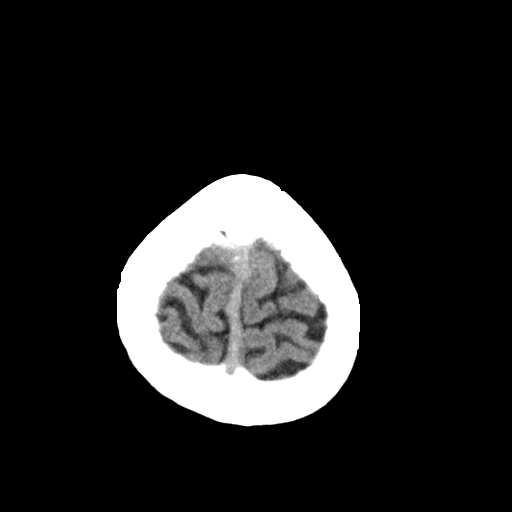
[im 27/33  bone]
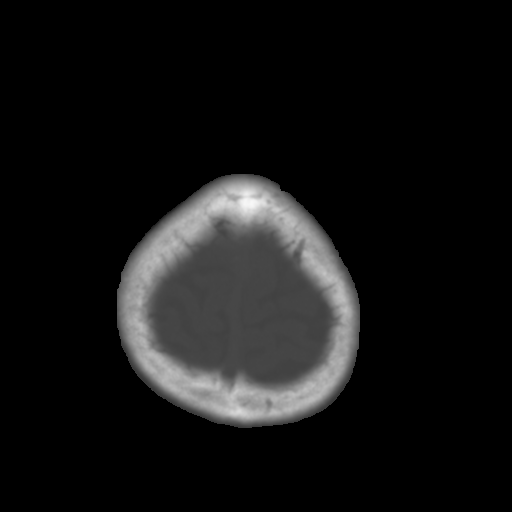

[Series 4: coronal soft · coronal · 0.33mm/px · 3 of 67 slices shown]
[im 23/67  brain]
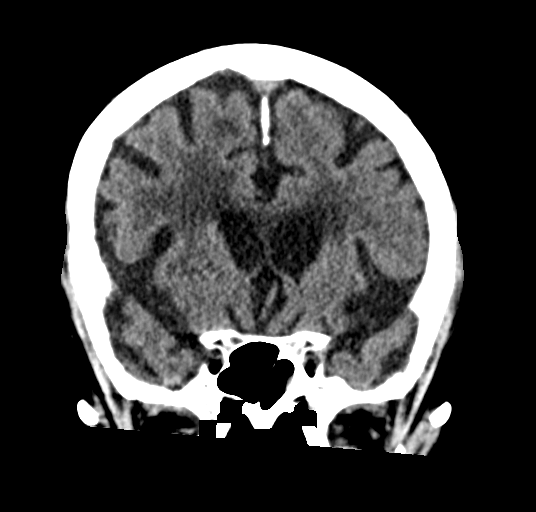
[im 30/67  brain]
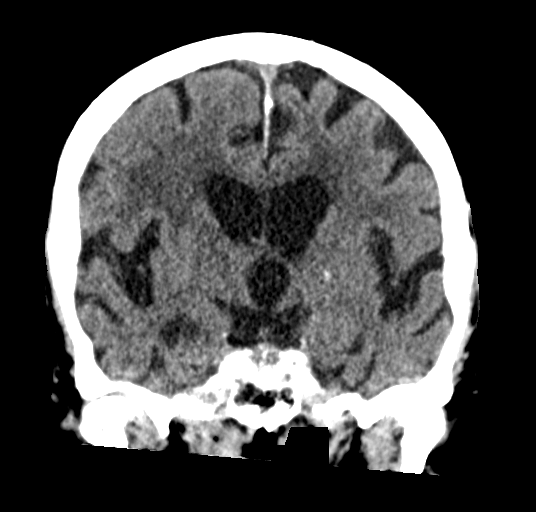
[im 37/67  brain]
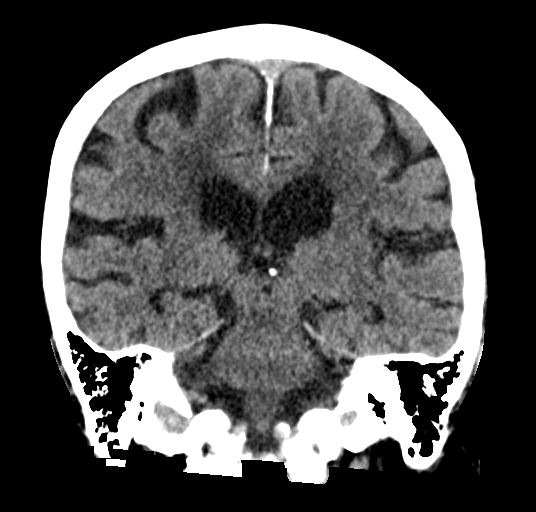

[Series 5: sagittal soft · sagittal · 0.34mm/px · 3 of 52 slices shown]
[im 18/52  brain]
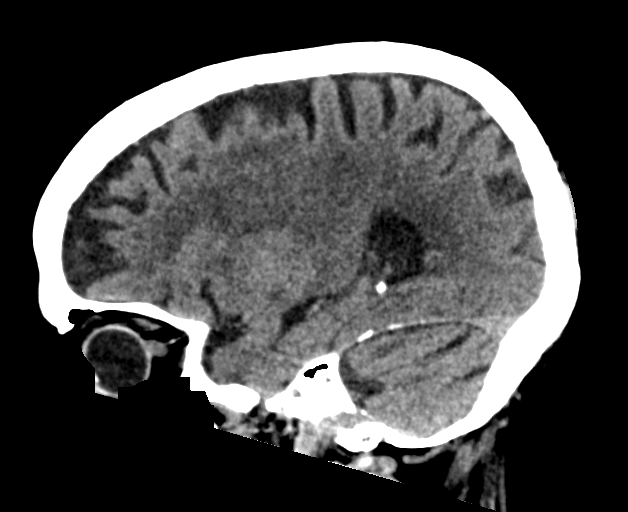
[im 26/52  brain]
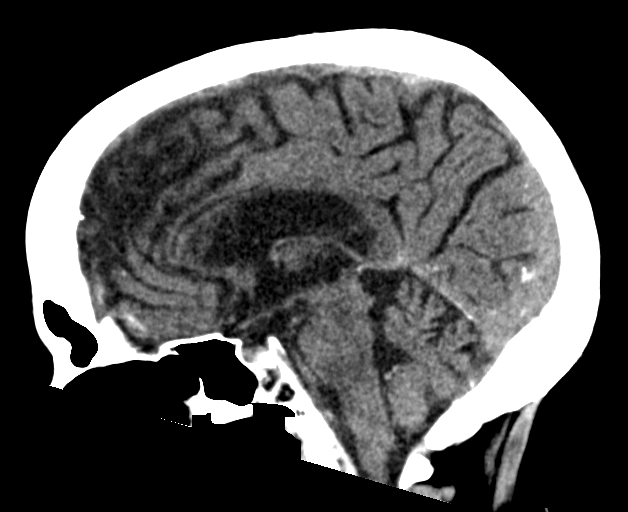
[im 35/52  brain]
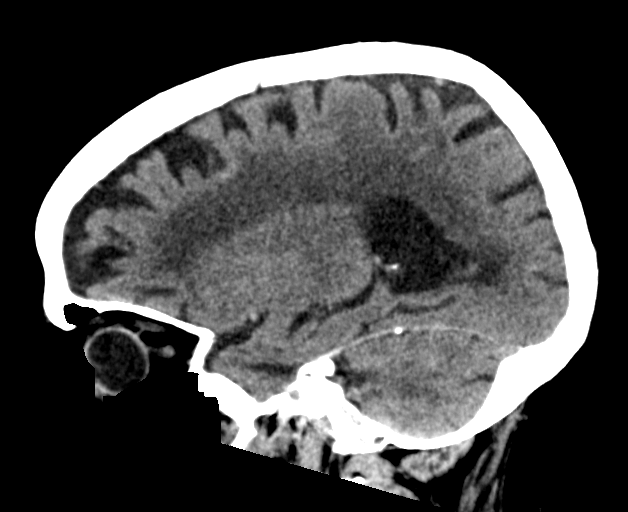

[Series 6: head wo ax · axial · 0.35mm/px · z∈[-165,-81]mm · 4 of 34 slices shown]
[im 6/34  brain]
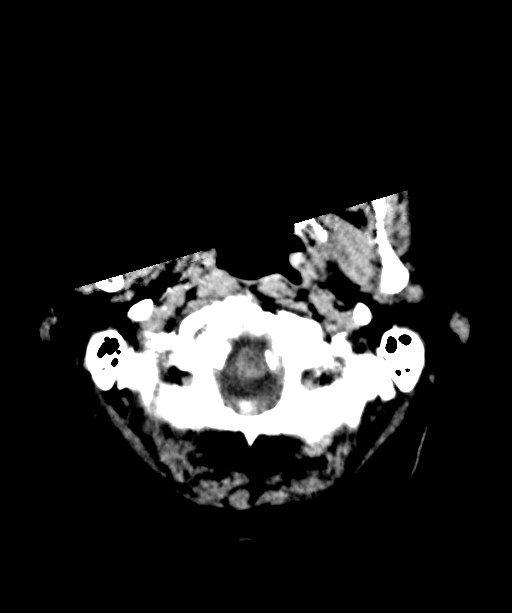
[im 12/34  brain]
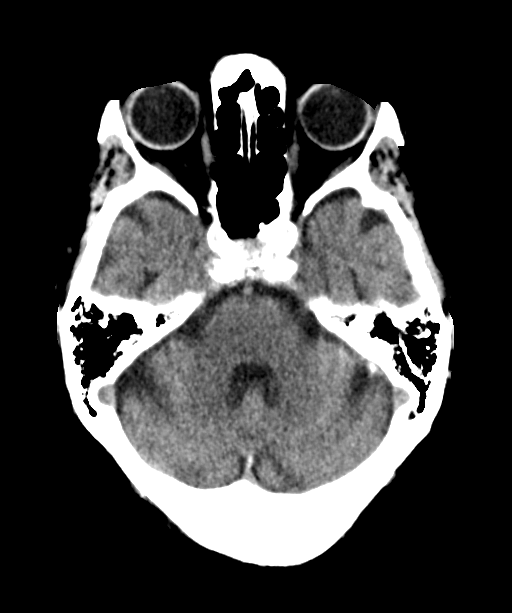
[im 17/34  brain]
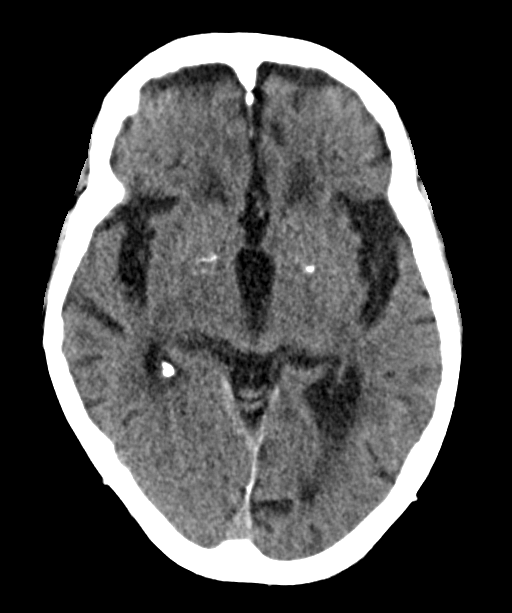
[im 23/34  brain]
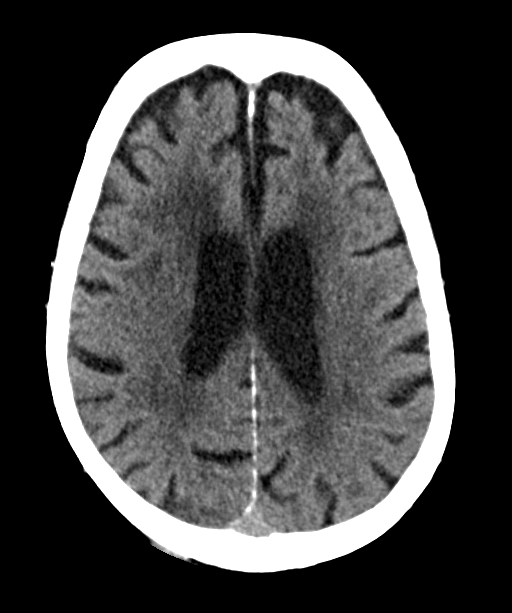

[15 of 47 positions shown; findings below may reference images not displayed]

FINDINGS: Brain: Generalized cerebral volume loss since 4707. No midline
shift, ventriculomegaly, mass effect, evidence of mass lesion,
intracranial hemorrhage or evidence of cortically based acute
infarction. Patchy and confluent bilateral white matter hypodensity
has progressed since 4707, with asymmetric involvement of the deep
gray matter nuclei. Stable chronic basal ganglia vascular
calcifications. No cortical encephalomalacia identified.

Vascular: Extensive Calcified atherosclerosis at the skull base. No
suspicious intracranial vascular hyperdensity.

Skull: Intact.  Cervical spine is reported separately today.

Sinuses/Orbits: Chronic left sphenoid sinus inflammation has
decreased since 4707. Other Visualized paranasal sinuses and
mastoids are clear.

Other: No orbit or scalp soft tissue injury identified. There is
some scalp vessel calcified atherosclerosis.
IMPRESSION: 1. No acute traumatic injury identified.
2. Progressed cerebral volume loss and small vessel disease since
4707, most commonly due to chronic small vessel disease.
3. Cervical Spine CT reported separately today.

## 2021-10-28 IMAGING — CT CT CERVICAL SPINE W/O CM
3 of 4 series · 12 of 33 positions shown, 14 images · non-contrast
Comparison: Head CT today.  Cervical spine CT 09/22/2016.

CLINICAL DATA: [AGE] female status post unwitnessed fall from
bed. History of a type 2 dens fracture in 2583.

EXAM:
CT CERVICAL SPINE WITHOUT CONTRAST
TECHNIQUE: Multidetector CT imaging of the cervical spine was performed without
intravenous contrast. Multiplanar CT image reconstructions were also
generated.

[Series 5: cor bone · coronal · 0.28mm/px · 3 of 61 slices shown]
[im 13/61  bone]
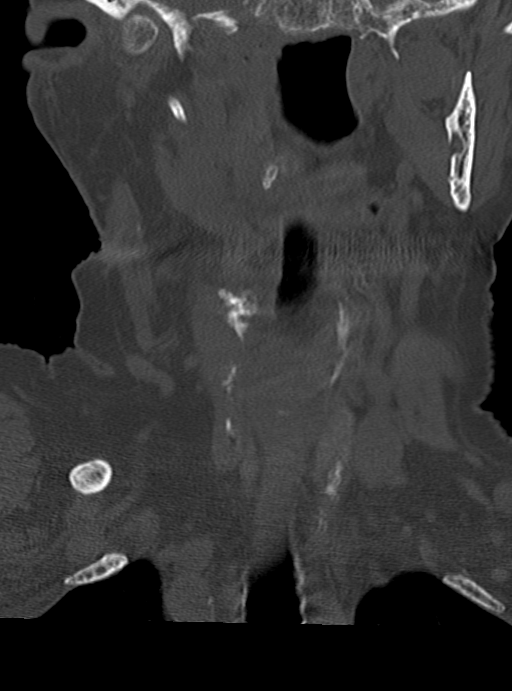
[im 25/61  bone]
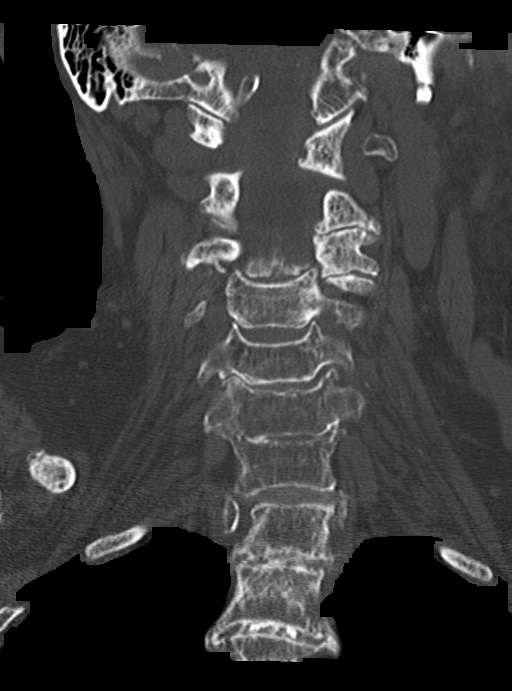
[im 37/61  bone]
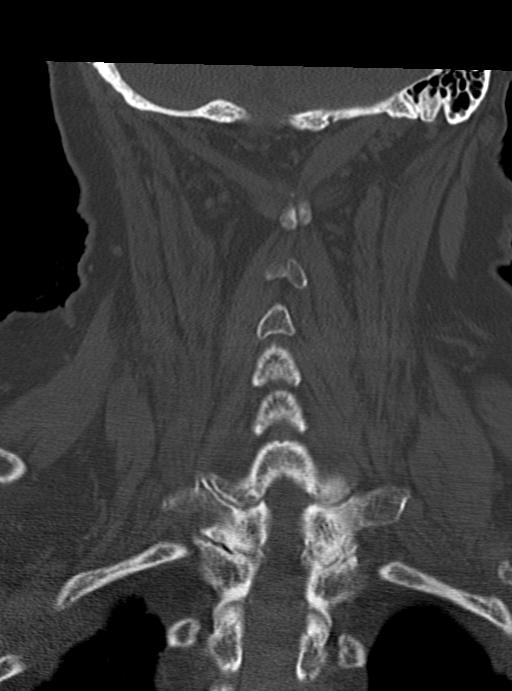

[Series 6: sag bone · sagittal · 0.24mm/px · 5 of 57 slices shown, 6 images]
[im 19/57  bone]
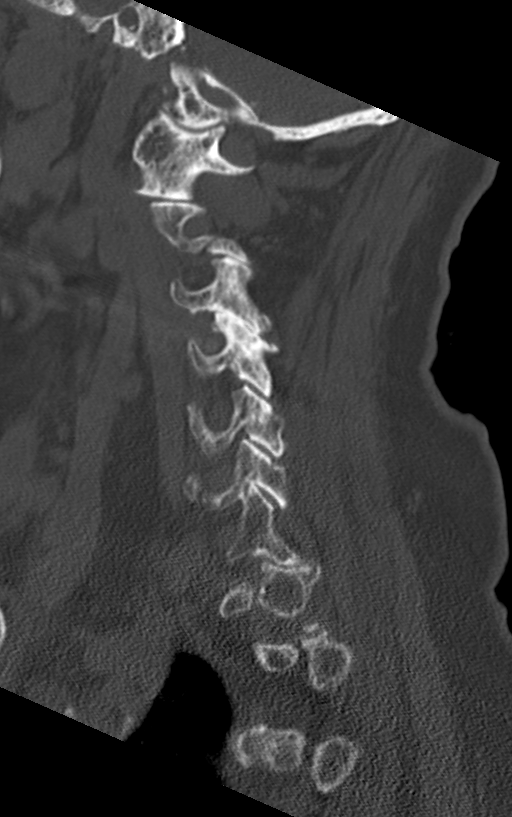
[im 24/57  bone]
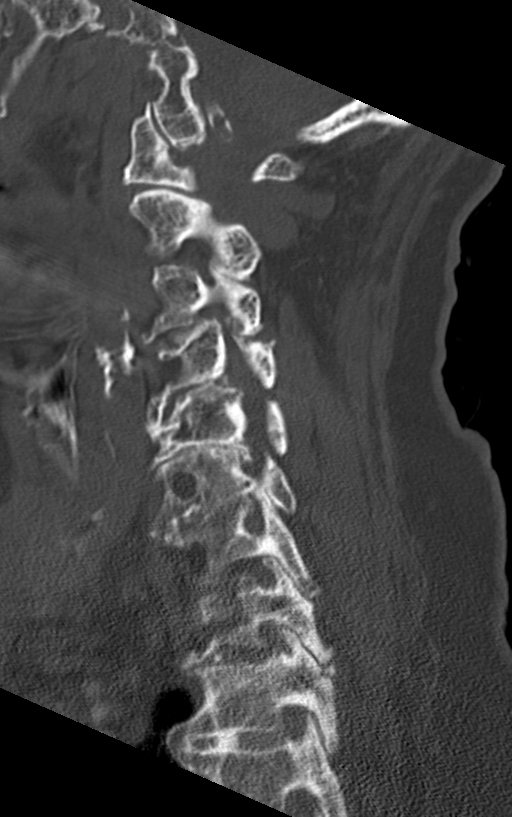
[im 29/57  soft-tissue]
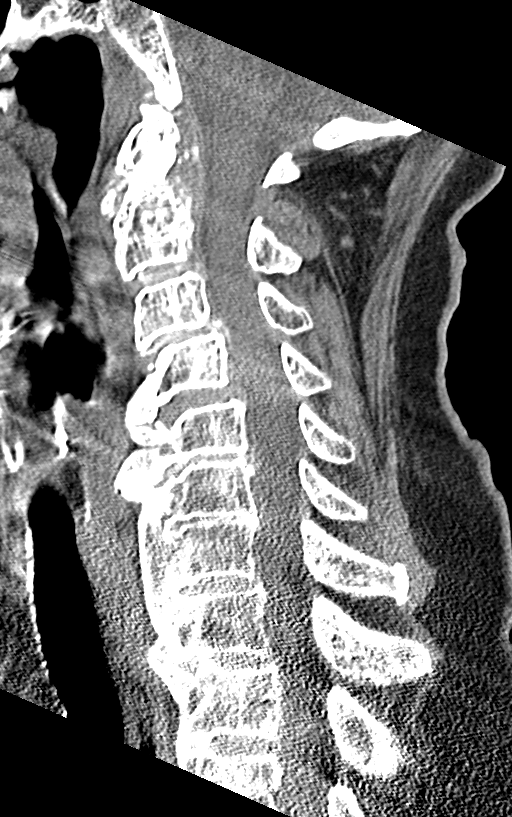
[im 29/57  bone]
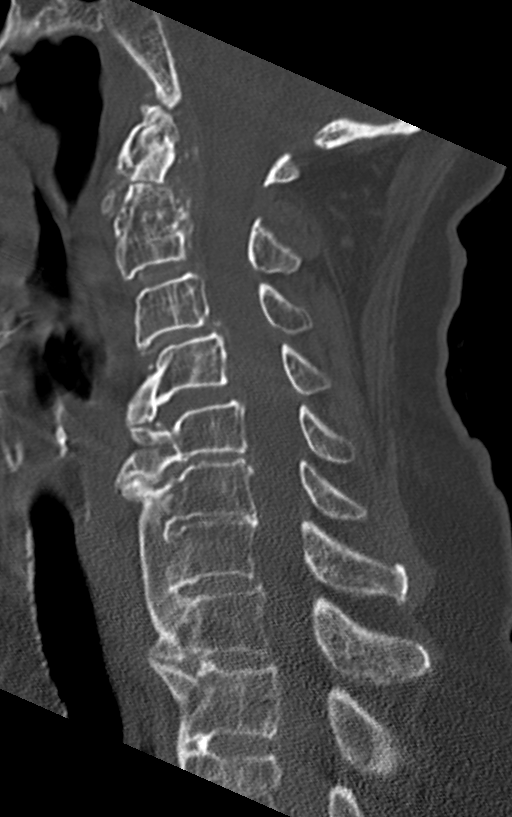
[im 33/57  bone]
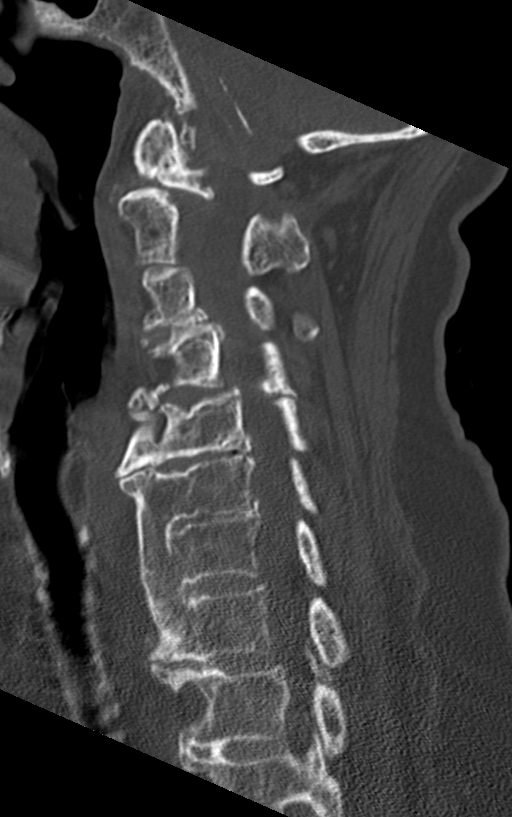
[im 38/57  bone]
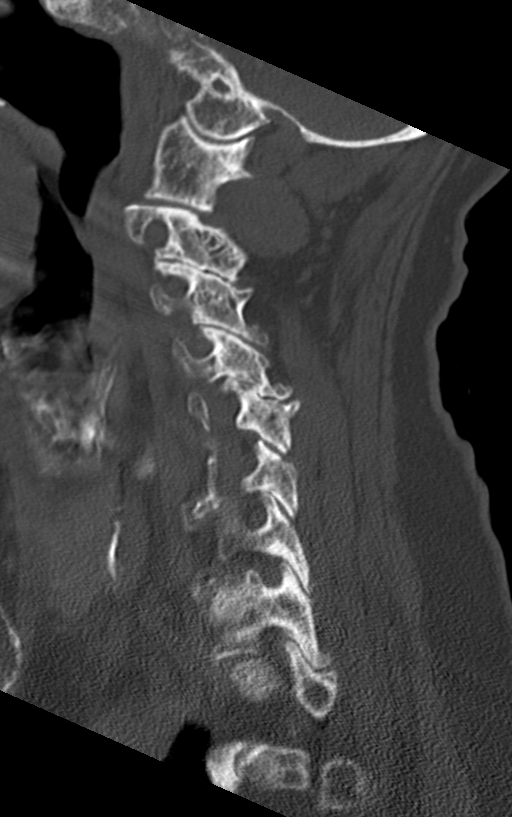

[Series 7: orthogonal axials · axial · 0.21mm/px · z∈[-273,-179]mm · 4 of 92 slices shown, 5 images]
[im 16/92  soft-tissue]
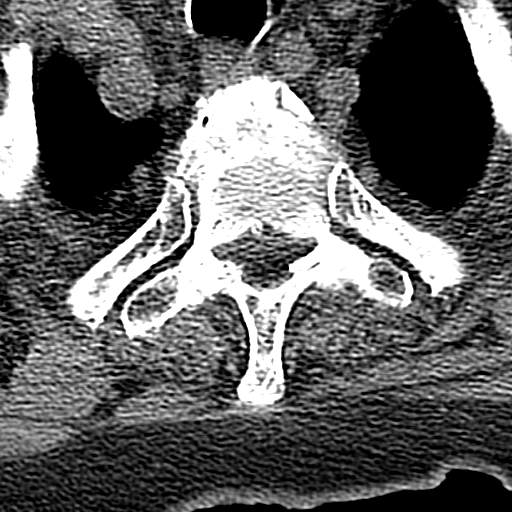
[im 16/92  bone]
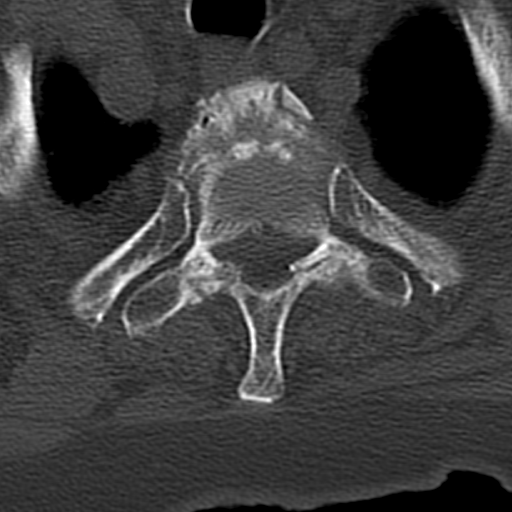
[im 31/92  bone]
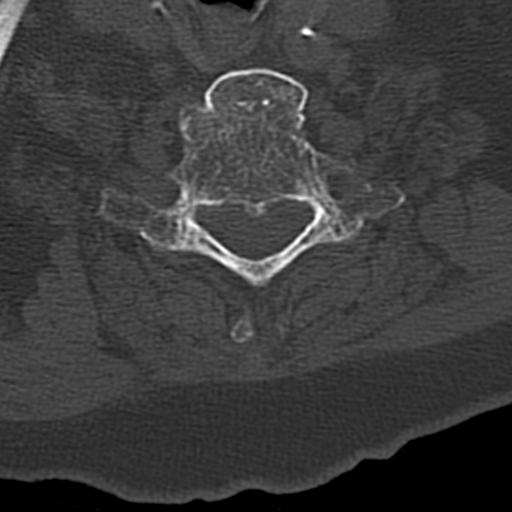
[im 61/92  bone]
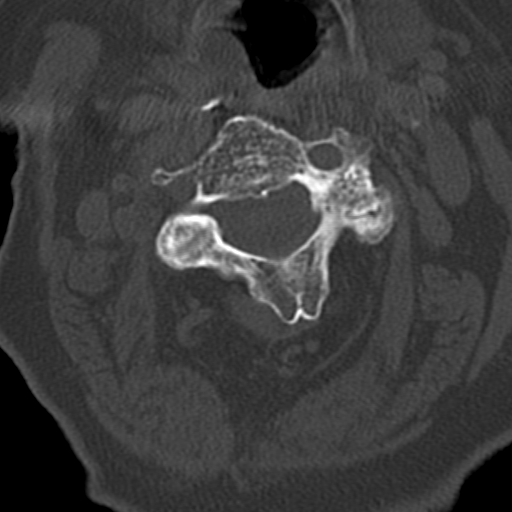
[im 76/92  bone]
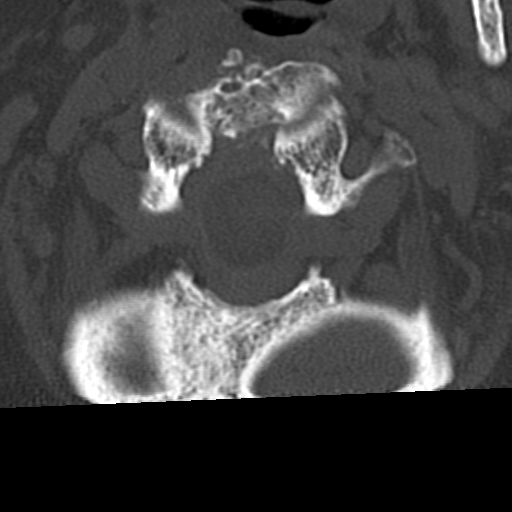

[12 of 33 positions shown; findings below may reference images not displayed]

FINDINGS: Alignment: Stable since 2583. Chronic straightening of cervical
lordosis with chronic mild anterolisthesis at C3-C4 and C4-C5.
Chronic interbody ankylosis at the cervicothoracic junction. Stable
posterior element alignment.

Skull base and vertebrae: Visualized skull base is intact. No
atlanto-occipital dissociation. Ununited type 2 dens fracture
appears to be chronic and mostly corticated now (sagittal image 29).
And there is evidence that the odontoid has fused to the C1 ring
since 2583. C1-C2 alignment is maintained. C1 remains intact.

No new C2 fracture identified. No acute osseous abnormality
identified. Diffuse idiopathic skeletal hyperostosis (DISH) detailed
below.

Soft tissues and spinal canal: No prevertebral fluid or swelling. No
visible canal hematoma. Negative noncontrast neck soft tissues aside
from bulky chronic cervical carotid calcified atherosclerosis and a
partially retropharyngeal course of both carotids.

Disc levels: Advanced chronic degeneration about C1 and C2. Chronic
ligamentous hypertrophy about the odontoid. Chronic bulky flowing
endplate osteophytes anteriorly from C4 into the upper thoracic
spine. Associated chronic C6-C7 and now C7-T1 interbody ankylosis.
Advanced upper cervical endplate and facet degeneration, severe on
the left at C3-C4 and C4-C5 associated with chronic
spondylolisthesis.

But mild if any associated cervical spinal stenosis.

Upper chest: Partially visible upper thoracic interbody ankylosis at
T2-T3. Negative lung apices.
IMPRESSION: 1. Type 2 dens fracture from 2583 is ununited, and the odontoid
fragment has fused to the C1 ring since that time. But C1-C2
alignment is maintained. And no acute traumatic injury is identified
in the cervical spine.
2. Underlying chronic diffuse idiopathic skeletal hyperostosis
(DISH). Chronic lower thoracic and cervicothoracic junction
interbody ankylosis. Mild superimposed upper cervical
spondylolisthesis.

## 2021-12-15 IMAGING — DX DG CHEST 2V
2 series · 2 of 2 positions shown · non-contrast
Comparison: 05/28/2015

CLINICAL DATA: Shortness of breath

EXAM:
CHEST - 2 VIEW

[chest ap]
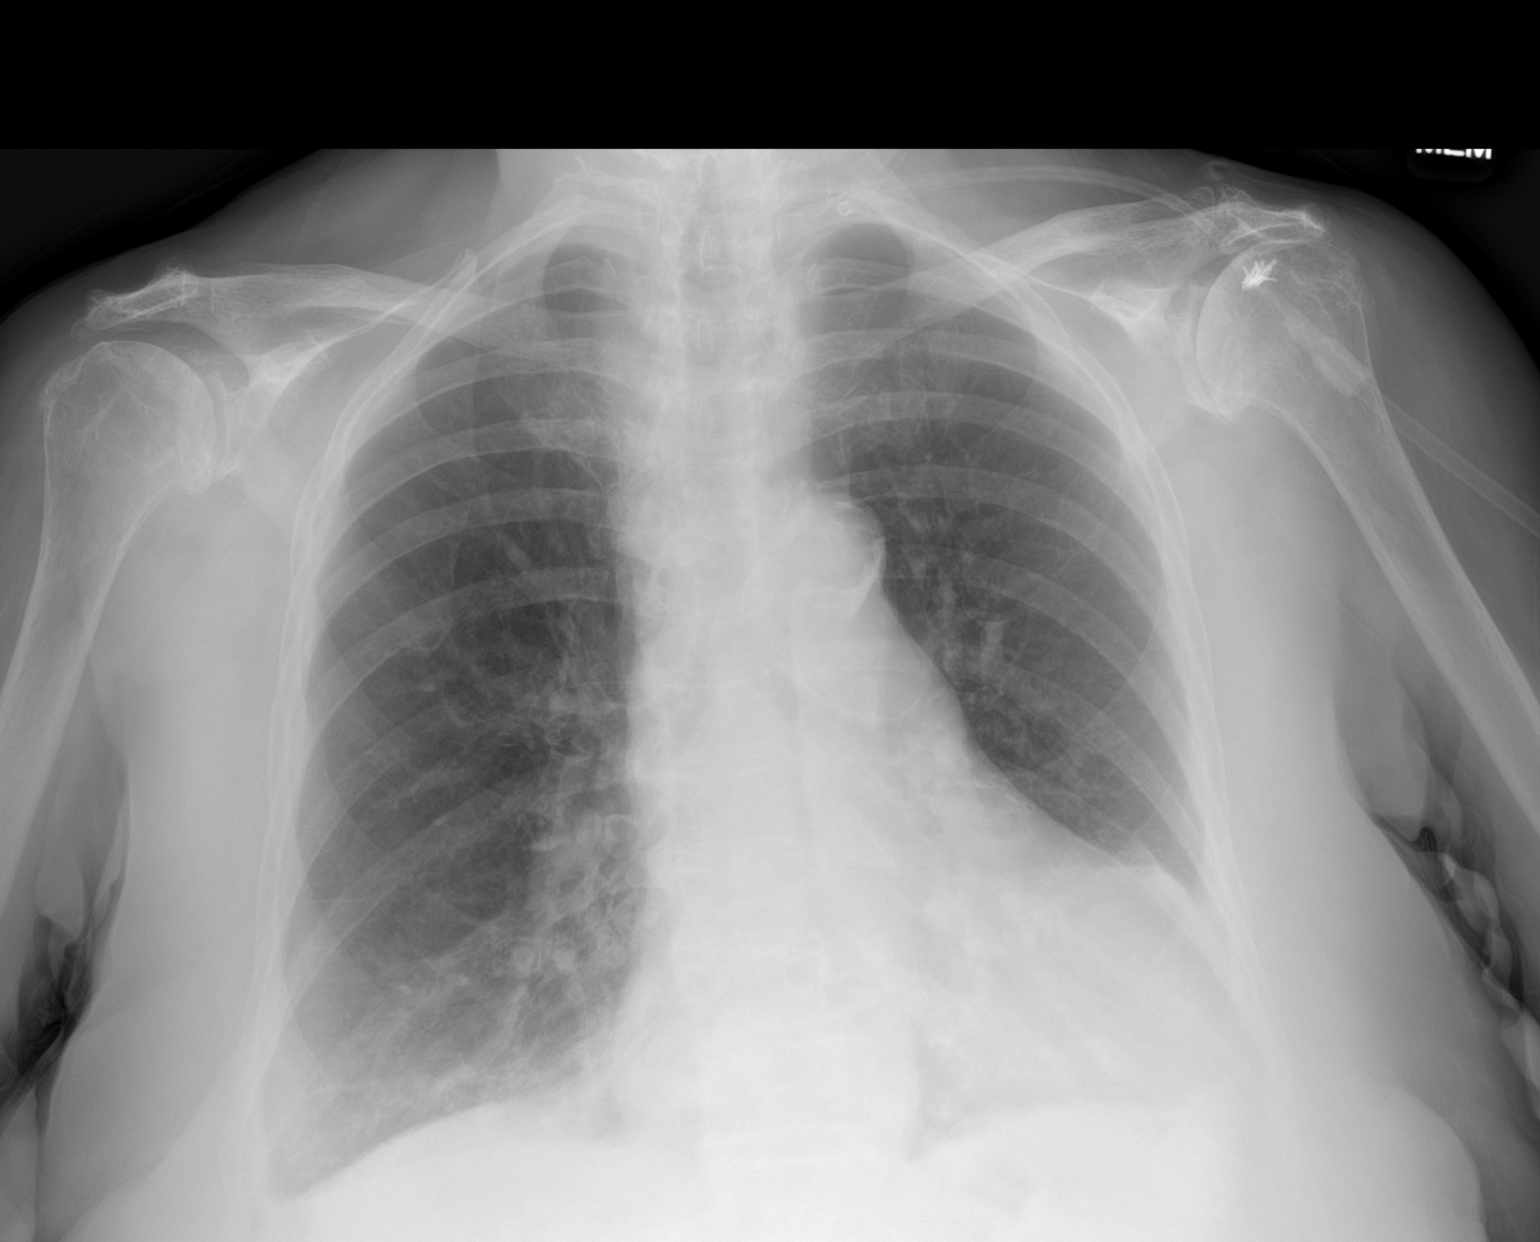

[chest lat]
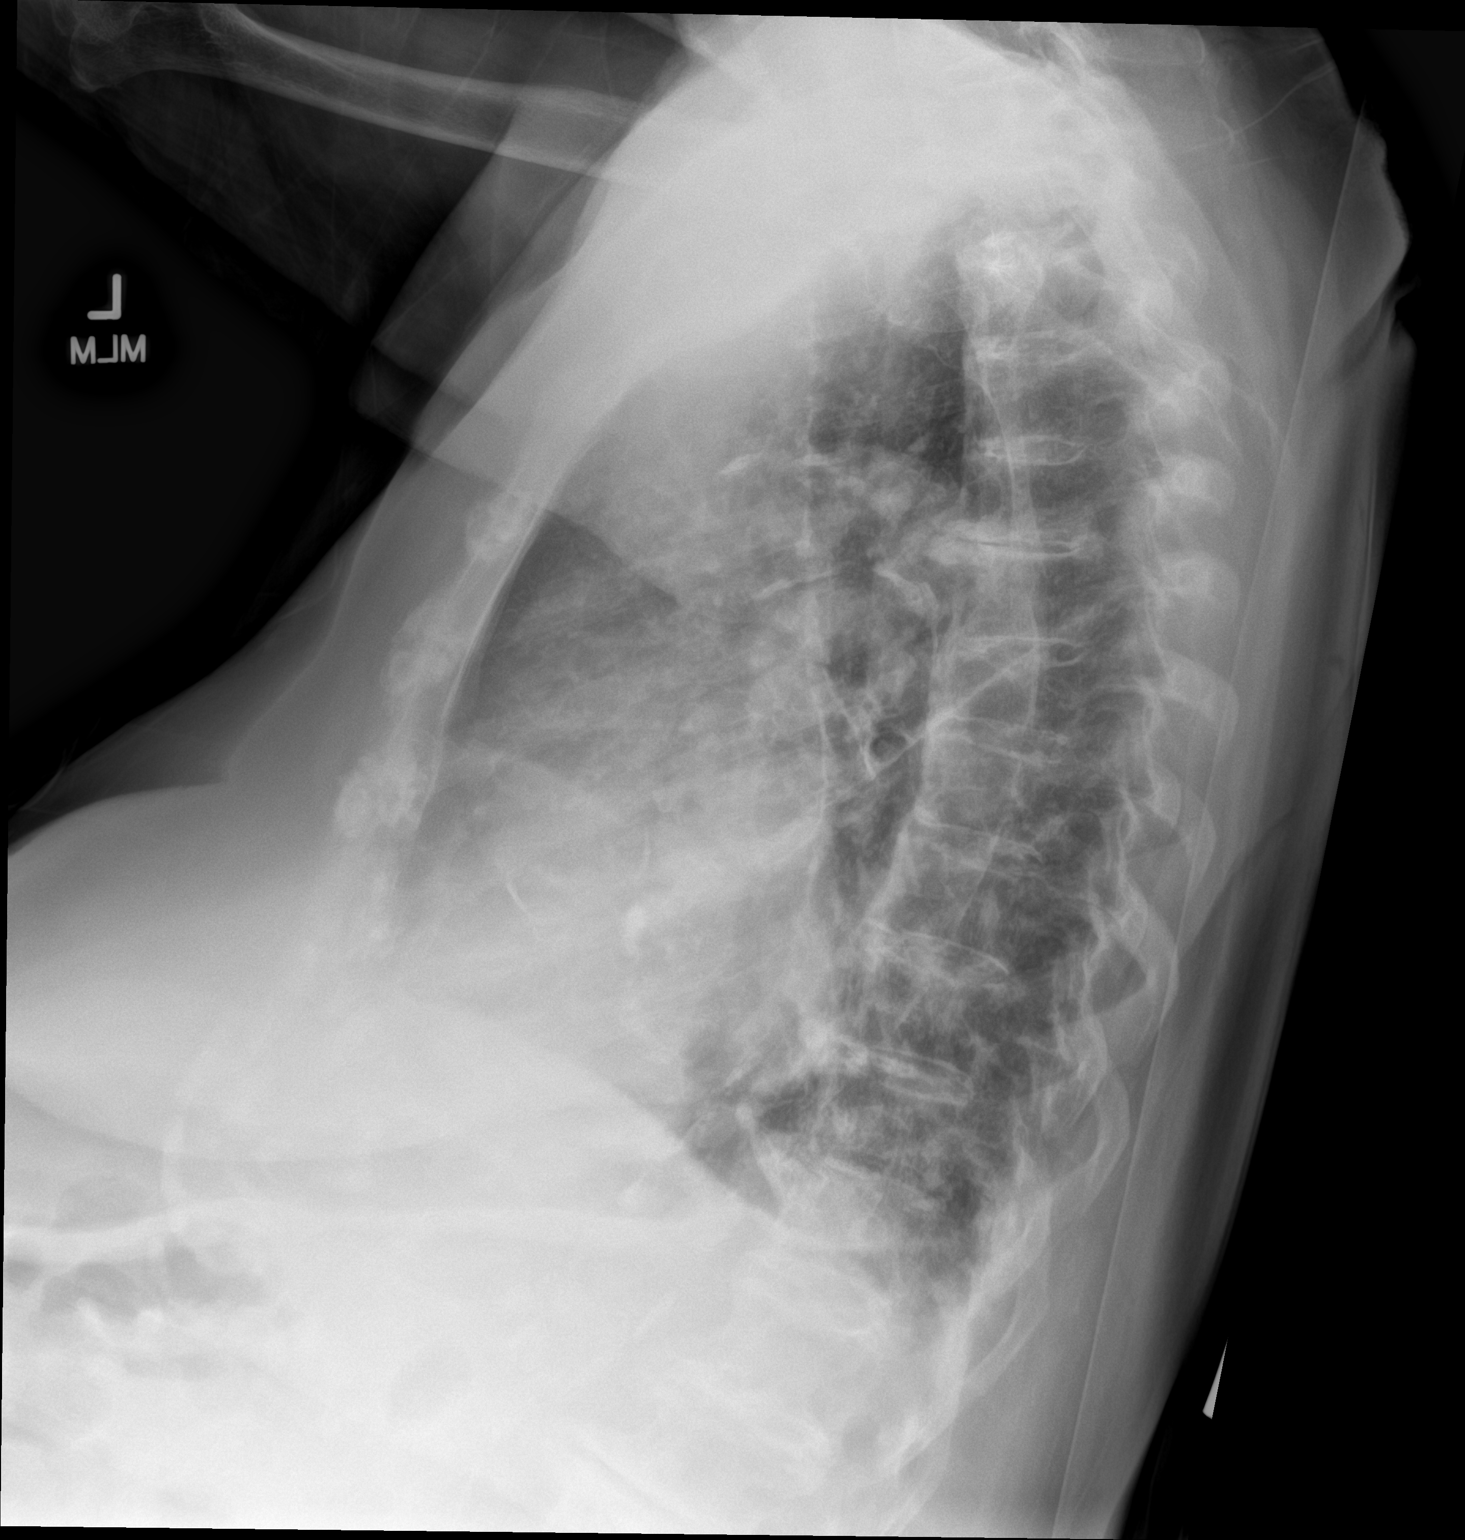

[2 of 2 positions shown; findings below may reference images not displayed]

FINDINGS: Heart size is normal. Chronic aortic atherosclerosis. Patchy
bilateral lower lobe pneumonia, left worse than right. No dense
consolidation or lobar collapse. No visible effusion. No acute bone
finding.
IMPRESSION: Patchy infiltrates in both lower lobes, left more than right,
consistent with bronchopneumonia.
